# Patient Record
Sex: Female | Born: 1990 | Race: Black or African American | Hispanic: No | Marital: Single | Smoking: Current some day smoker
Health system: Southern US, Community
[De-identification: ages and names within clinical notes are randomized; demographics above are authoritative.]

## PROBLEM LIST (undated history)

## (undated) ENCOUNTER — Inpatient Hospital Stay (HOSPITAL_COMMUNITY): Payer: Self-pay

## (undated) DIAGNOSIS — F410 Panic disorder [episodic paroxysmal anxiety] without agoraphobia: Secondary | ICD-10-CM

## (undated) DIAGNOSIS — R519 Headache, unspecified: Secondary | ICD-10-CM

## (undated) DIAGNOSIS — N200 Calculus of kidney: Secondary | ICD-10-CM

## (undated) DIAGNOSIS — D649 Anemia, unspecified: Secondary | ICD-10-CM

## (undated) DIAGNOSIS — N390 Urinary tract infection, site not specified: Secondary | ICD-10-CM

## (undated) DIAGNOSIS — I2699 Other pulmonary embolism without acute cor pulmonale: Secondary | ICD-10-CM

## (undated) DIAGNOSIS — J45909 Unspecified asthma, uncomplicated: Secondary | ICD-10-CM

## (undated) HISTORY — PX: NO PAST SURGERIES: SHX2092

## (undated) HISTORY — DX: Anemia, unspecified: D64.9

## (undated) NOTE — *Deleted (*Deleted)
History     CSN: [id number]  Arrival date and time: 08/05/20 1654   First Provider Initiated Contact with Patient 08/05/20 1847      Chief Complaint  Patient presents with  . Nausea  . Emesis   HPI  Ms.Carmen [name] is a 15 y.o. female G2P1001 @ [redacted]w[redacted]d with a history of cannabis hyperemesis, DVT on lovenox here with nausea and vomiting. Reports she has not energy to do anything and has had off and on abdominal pain. The pain is all over her abdomen and feels like "bulges". She was taking phenergan suppository,  however ran out of her prescription 2 days ago. Reports she cannot take Zofran because she cannot take a pill. Reports not being able to keep anything down in the last 24 hours. She took her scopolamine patch off and did not replace it.   Patient requesting to have a note to be out of work d/t symptoms.   OB History    Gravida  2   Para  1   Term  1   Preterm  0   AB  0   Living  1     SAB  0   TAB  0   Ectopic  0   Multiple  0   Live Births  1           Past Medical History:  Diagnosis Date  . Anemia   . Asthma   . Panic attacks   . Pulmonary embolism (HCC) 02/08/2018  . Pulmonary embolism, antepartum 02/08/2018   Indications: PE diagnosed 5/2@ [facility].  Left side, lower lobe;   Admitted 5/2-5/6 - heparin drip>lovenox 60BID.  ED evaluation 5/8 2/2 persistent SOB - negative DVT, nml ECG Anticoagulation goal is therapeutic  Therapeutic dose is Lovenox 1mg /kg SQ Q12--currently on 60 mg BID  Monitoring is indicated for patients on therapeutic anticoagulation  Will check Anti-Xa level 4-6 hours after 3rd dose Q trime    Past Surgical History:  Procedure Laterality Date  . NO PAST SURGERIES      Family History  Problem Relation Age of Onset  . Pulmonary embolism Mother   . Ulcers Sister     Social History   Tobacco Use  . Smoking status: Former Smoker    Types: Cigarettes  . Smokeless tobacco: Never Used  Vaping Use  . Vaping Use: Never used   Substance Use Topics  . Alcohol use: Not Currently  . Drug use: No    Allergies:  Allergies  Allergen Reactions  . Morphine And Related Anaphylaxis, Swelling and Other (See Comments)    "swells shut" the patient's throat    Medications Prior to Admission  Medication Sig Dispense Refill Last Dose  . enoxaparin (LOVENOX) 40 MG/0.4ML injection Inject 0.4 mLs (40 mg total) into the skin daily. 30 mL 2 Past Week at Unknown time  . promethazine (PHENERGAN) 25 MG suppository Place 1 suppository (25 mg total) rectally every 6 (six) hours as needed for nausea or vomiting. 12 each 2 Past Week at Unknown time  . albuterol (VENTOLIN HFA) 108 (90 Base) MCG/ACT inhaler Inhale 1-2 puffs into the lungs every 6 (six) hours as needed for wheezing or shortness of breath. 18 g 2   . Prenatal Vit-Fe Fumarate-FA (PRENATAL PO) Take 1 tablet by mouth daily.      Results for orders placed or performed during the hospital encounter of 08/05/20 (from the past 48 hour(s))  Urinalysis, Routine w reflex microscopic Urine, Clean Catch  Status: Abnormal   Collection Time: 08/05/20  6:30 PM  Result Value Ref Range   Color, Urine YELLOW YELLOW   APPearance HAZY (A) CLEAR   Specific Gravity, Urine 1.017 1.005 - 1.030   pH 6.0 5.0 - 8.0   Glucose, UA NEGATIVE NEGATIVE mg/dL   Hgb urine dipstick NEGATIVE NEGATIVE   Bilirubin Urine NEGATIVE NEGATIVE   Ketones, ur NEGATIVE NEGATIVE mg/dL   Protein, ur NEGATIVE NEGATIVE mg/dL   Nitrite NEGATIVE NEGATIVE   Leukocytes,Ua SMALL (A) NEGATIVE   RBC / HPF 0-5 0 - 5 RBC/hpf   WBC, UA 6-10 0 - 5 WBC/hpf   Bacteria, UA FEW (A) NONE SEEN   Squamous Epithelial / LPF 0-5 0 - 5   Mucus PRESENT     Comment: Performed at [facility], [address], [place], [place] [address]  CBC     Status: None   Collection Time: 08/05/20  7:14 PM  Result Value Ref Range   WBC 7.9 4.0 - 10.5 K/uL   RBC 4.13 3.87 - 5.11 MIL/uL   Hemoglobin 12.1 12.0 - 15.0 g/dL   HCT 47.8 36 -  46 %   MCV 88.6 80.0 - 100.0 fL   MCH 29.3 26.0 - 34.0 pg   MCHC 33.1 30.0 - 36.0 g/dL   RDW 29.5 62.1 - 30.8 %   Platelets 246 150 - 400 K/uL   nRBC 0.0 0.0 - 0.2 %    Comment: Performed at [facility], [address] [place], [place] [address]   Review of Systems  Constitutional: Negative for fever.  Gastrointestinal: Positive for abdominal pain, nausea and vomiting. Negative for diarrhea.  Genitourinary: Negative for pelvic pain.   Physical Exam   Blood pressure 106/74, pulse (!) 106, temperature 98.7 F (37.1 C), temperature source Oral, resp. rate 18, height 4\' 11"  (1.499 m), weight 57.2 kg, last menstrual period 04/19/2020, SpO2 99 %, unknown if currently breastfeeding.  Physical Exam Constitutional:      Appearance: She is ill-appearing. She is not toxic-appearing.  HENT:     Head: Normocephalic.  Abdominal:     Tenderness: There is generalized abdominal tenderness. There is no guarding or rebound.  Neurological:     Mental Status: She is alert.  Psychiatric:        Mood and Affect: Affect is flat and inappropriate.        Behavior: Behavior is slowed and withdrawn.    MAU Course  Procedures  None  MDM  + fetal heart tones via doppler  Weight 9/20: 53 kg, today 57 kg UA without severe dehydration.  Lr bolus X 1 Pepcid, Zofran, phenergan IV  Report given to Wynelle Bourgeois, CNM who resumes care of the patient.   Duane Lope, NP 08/05/2020 7:38 PM   Assessment and Plan

---

## 2015-07-04 ENCOUNTER — Emergency Department (INDEPENDENT_AMBULATORY_CARE_PROVIDER_SITE_OTHER): Payer: Self-pay

## 2015-07-04 ENCOUNTER — Emergency Department (INDEPENDENT_AMBULATORY_CARE_PROVIDER_SITE_OTHER)
Admission: EM | Admit: 2015-07-04 | Discharge: 2015-07-04 | Disposition: A | Discharge status: Home / Self Care | Payer: Self-pay | Source: Home / Self Care | Attending: Family Medicine | Admitting: Family Medicine

## 2015-07-04 DIAGNOSIS — S60419A Abrasion of unspecified finger, initial encounter: Secondary | ICD-10-CM

## 2015-07-04 DIAGNOSIS — S6992XA Unspecified injury of left wrist, hand and finger(s), initial encounter: Secondary | ICD-10-CM

## 2015-07-04 MED ORDER — NAPROXEN 500 MG PO TABS: 500.0000 mg | ORAL_TABLET | Freq: Two times a day (BID) | ORAL | Status: DC

## 2015-07-04 MED ORDER — KETOROLAC TROMETHAMINE 30 MG/ML IJ SOLN
30.0000 mg | Freq: Once | INTRAMUSCULAR | Status: AC
  Administered 2015-07-04: 30 mg via INTRAVENOUS

## 2015-07-04 MED ORDER — KETOROLAC TROMETHAMINE 30 MG/ML IJ SOLN
INTRAMUSCULAR | Status: AC
  Filled 2015-07-04: qty 1

## 2015-07-04 NOTE — ED Provider Notes (Signed)
CSN: [id number]     Arrival date & time 07/04/15  1857 History   None    No chief complaint on file.  (Consider location/radiation/quality/duration/timing/severity/associated sxs/prior Treatment) Patient is a 24 y.o. female presenting with hand injury. The history is provided by the patient. No language interpreter was used.  Hand Injury Location:  Hand Time since incident:  1 hour Injury: yes   Mechanism of injury comment:  She was trying to raise her window at home and it fell back on her left hand/finger Hand location:  L hand Pain details:    Quality:  Aching   Radiates to:  Does not radiate   Severity:  Severe (pain is about 10/10 in severity)   Onset quality:  Sudden   Timing:  Constant   Progression:  Unchanged Tetanus status:  Up to date (patient stated she got her tetanus shot .) Relieved by:  Rest Worsened by:  Nothing tried Ineffective treatments:  None tried Associated symptoms: no muscle weakness, no numbness, no swelling and no tingling     No past medical history on file. No past surgical history on file. No family history on file. Social History  Substance Use Topics  . Smoking status: Not on file  . Smokeless tobacco: Not on file  . Alcohol Use: Not on file   OB History    No data available     Review of Systems  Respiratory: Negative.   Cardiovascular: Negative.   Gastrointestinal: Negative.   Musculoskeletal: Positive for arthralgias.       Left hand pain  Skin: Positive for wound.       Left hand pain. Left finger injury  All other systems reviewed and are negative.   Allergies  Review of patient's allergies indicates not on file.  Home Medications   Prior to Admission medications   Not on File   Meds Ordered and Administered this Visit  Medications - No data to display  There were no vitals taken for this visit. No data found.   Physical Exam  Constitutional: She appears well-developed. No distress.  Cardiovascular: Normal rate,  regular rhythm and normal heart sounds.   No murmur heard. Pulmonary/Chest: Effort normal and breath sounds normal. No respiratory distress. She has no wheezes.  Musculoskeletal:       Left hand: She exhibits decreased range of motion and tenderness. She exhibits normal capillary refill and no swelling. Normal sensation noted.       Hands: Nursing note and vitals reviewed.   ED Course  Procedures (including critical care time)  Labs Review Labs Reviewed - No data to display  Imaging Review No results found. Dg Hand Complete Left  07/04/2015   CLINICAL DATA:  Crush injury to the second through fourth digits, initial encounter  EXAM: LEFT HAND - COMPLETE 3+ VIEW  COMPARISON:  None.  FINDINGS: Soft tissue injury is noted along the distal aspects of the second third and fourth digits related to the recent injury. No underlying bony abnormality is seen.  IMPRESSION: Soft tissue injury without bony abnormality.   Electronically Signed   By: Alcide Clever M.D.   On: 07/04/2015 19:41     Visual Acuity Review  Right Eye Distance:   Left Eye Distance:   Bilateral Distance:    Right Eye Near:   Left Eye Near:    Bilateral Near:         MDM  No diagnosis found. Left hand injury. Left finger abrasion ( mild)  Xray  done to r/o crush or fracture, this was negative. Wound dressing done today. Toradol 30 mg IM given x 1 for pain which relieved her symptoms. No indication fr suturing today. Naproxen given prn pain. Return precaution discussed. More than 30 min was spent for this face to face encounter and coordination of care.    Doreene Eland, MD 07/04/15 2025

## 2015-07-04 NOTE — Discharge Instructions (Signed)
Abrasions An abrasion is a cut or scrape of the skin. Abrasions do not go through all layers of the skin. HOME CARE  If a bandage (dressing) was put on your wound, change it as told by your doctor. If the bandage sticks, soak it off with warm.  Wash the area with water and soap 2 times a day. Rinse off the soap. Pat the area dry with a clean towel.  Put on medicated cream (ointment) as told by your doctor.  Change your bandage right away if it gets wet or dirty.  Only take medicine as told by your doctor.  See your doctor within 24-48 hours to get your wound checked.  Check your wound for redness, puffiness (swelling), or yellowish-white fluid (pus). GET HELP RIGHT AWAY IF:   You have more pain in the wound.  You have redness, swelling, or tenderness around the wound.  You have pus coming from the wound.  You have a fever or lasting symptoms for more than 2-3 days.  You have a fever and your symptoms suddenly get worse.  You have a bad smell coming from the wound or bandage. MAKE SURE YOU:   Understand these instructions.  Will watch your condition.  Will get help right away if you are not doing well or get worse. Document Released: 03/13/2008 Document Revised: 06/19/2012 Document Reviewed: 08/29/2011 Exit[employer] Patient Information 2015 [employer]. This information is not intended to replace advice given to you by your health care provider. Make sure you discuss any questions you have with your health care provider.  

## 2015-07-04 NOTE — ED Notes (Signed)
The patient presented to the [facility] with a complaint of a hand injury. The patient stated that she was trying to raise a window and the window fell on her hand.

## 2015-07-09 ENCOUNTER — Emergency Department (INDEPENDENT_AMBULATORY_CARE_PROVIDER_SITE_OTHER)
Admission: EM | Admit: 2015-07-09 | Discharge: 2015-07-09 | Disposition: A | Discharge status: Home / Self Care | Payer: Self-pay | Source: Home / Self Care | Attending: Family Medicine | Admitting: Family Medicine

## 2015-07-09 ENCOUNTER — Encounter (HOSPITAL_COMMUNITY): Payer: Self-pay | Admitting: Emergency Medicine

## 2015-07-09 DIAGNOSIS — S6992XD Unspecified injury of left wrist, hand and finger(s), subsequent encounter: Secondary | ICD-10-CM

## 2015-07-09 NOTE — ED Notes (Signed)
Patient reports fingers hurting worse today.  Wanted fingers to be evaluated .  Has been taking naprosyn.

## 2015-07-09 NOTE — Discharge Instructions (Signed)
Warm epsom salts soak of hand 2-3 times a day, see orthopedist if further problems

## 2015-07-09 NOTE — ED Provider Notes (Signed)
CSN: [id number]     Arrival date & time 07/09/15  1334 History   First MD Initiated Contact with Patient 07/09/15 1432     Chief Complaint  Patient presents with  . Hand Problem   (Consider location/radiation/quality/duration/timing/severity/associated sxs/prior Treatment) Patient is a 24 y.o. female presenting with hand injury. The history is provided by the patient and a friend.  Hand Injury Location:  Hand Time since incident:  5 days (pt seen 1 hr after injury at [facility] and treated, pt still with pain in fingers.) Hand location:  L hand Pain details:    Quality:  Sharp and throbbing   Severity:  Moderate   Progression:  Unchanged Chronicity:  New Handedness:  Right-handed Dislocation: no   Tetanus status:  Up to date Prior injury to area:  No   No past medical history on file. No past surgical history on file. No family history on file. Social History  Substance Use Topics  . Smoking status: Not on file  . Smokeless tobacco: Not on file  . Alcohol Use: Not on file   OB History    No data available     Review of Systems  Musculoskeletal: Negative for joint swelling.  Skin: Positive for wound.  All other systems reviewed and are negative.   Allergies  Morphine and related  Home Medications   Prior to Admission medications   Medication Sig Start Date End Date Taking? Authorizing Provider  naproxen (NAPROSYN) 500 MG tablet Take 1 tablet (500 mg total) by mouth 2 (two) times daily. 07/04/15   Doreene Eland, MD   Meds Ordered and Administered this Visit  Medications - No data to display  BP 112/79 mmHg  Pulse 74  Temp(Src) 98.7 F (37.1 C) (Oral)  Resp 16  SpO2 98%  LMP 06/18/2015 (Exact Date) No data found.   Physical Exam  Constitutional: She is oriented to person, place, and time. She appears well-developed and well-nourished.  Musculoskeletal: She exhibits tenderness.  Abrasion flaps to lif with assoc pain in lmf and lrf with extension , no sts, no  warmth or erythema or drainage. Simply holding hand causes pain .  Neurological: She is alert and oriented to person, place, and time.  Skin: Skin is warm and dry.  Nursing note and vitals reviewed.   ED Course  Procedures (including critical care time)  Labs Review Labs Reviewed - No data to display  Imaging Review No results found.   Visual Acuity Review  Right Eye Distance:   Left Eye Distance:   Bilateral Distance:    Right Eye Near:   Left Eye Near:    Bilateral Near:         MDM   1. Hand injury, left, subsequent encounter        Linna Hoff, MD 07/09/15 1501

## 2015-07-09 NOTE — ED Notes (Signed)
Refused to soak hand at [facility].  Dr Artis Flock notified

## 2015-12-08 ENCOUNTER — Emergency Department (INDEPENDENT_AMBULATORY_CARE_PROVIDER_SITE_OTHER)
Admission: EM | Admit: 2015-12-08 | Discharge: 2015-12-08 | Disposition: A | Discharge status: Home / Self Care | Payer: Self-pay | Source: Home / Self Care | Attending: Emergency Medicine | Admitting: Emergency Medicine

## 2015-12-08 ENCOUNTER — Encounter (HOSPITAL_COMMUNITY): Payer: Self-pay | Admitting: Emergency Medicine

## 2015-12-08 DIAGNOSIS — I889 Nonspecific lymphadenitis, unspecified: Secondary | ICD-10-CM

## 2015-12-08 MED ORDER — CEPHALEXIN 500 MG PO CAPS: 500.0000 mg | ORAL_CAPSULE | Freq: Four times a day (QID) | ORAL | Status: DC

## 2015-12-08 NOTE — Discharge Instructions (Signed)

## 2015-12-08 NOTE — ED Notes (Signed)
Pt has a lump in her right groin that she states comes and goes.  She has had it for a year.  She states it is painful when it is enlarged and it hurts to stand up for long periods.  She gets sharp pains in it when standing.  She denies any fever.

## 2015-12-08 NOTE — ED Notes (Signed)
At bedside for provider exam of groin pain

## 2015-12-08 NOTE — ED Provider Notes (Signed)
CSN: [id number]     Arrival date & time 12/08/15  1303 History   First MD Initiated Contact with Patient 12/08/15 1340     Chief Complaint  Patient presents with  . Abscess    right groin   (Consider location/radiation/quality/duration/timing/severity/associated sxs/prior Treatment) HPI Comments: 25 year old female complaining of a lump to the right groin for proximal one year. She comes and goes. It will often completely disappear and will be asymptomatic. Other times it wall develop with tenderness and pain particularly on standing for prolonged periods of time. There does not seem to be any pattern or known stimulus that precedes the onset. It may last for several days or couple weeks before spontaneously resolves. Denies urogenital symptoms. Denies pain or sores or wounds to the right lower extremity. Denies known trauma.   History reviewed. No pertinent past medical history. History reviewed. No pertinent past surgical history. History reviewed. No pertinent family history. Social History  Substance Use Topics  . Smoking status: Current Some Day Smoker    Types: Cigarettes  . Smokeless tobacco: None  . Alcohol Use: Yes     Comment: 1 pack every 2 weeks   OB History    No data available     Review of Systems  Constitutional: Negative for fever, activity change and fatigue.  HENT: Negative.   Respiratory: Negative.   Cardiovascular: Negative.   Gastrointestinal: Negative.   Genitourinary: Negative for dysuria, urgency, frequency, flank pain, vaginal bleeding, vaginal discharge, genital sores, vaginal pain, menstrual problem and pelvic pain.  Musculoskeletal: Negative.   Skin: Negative.   Neurological: Negative.   Hematological: Positive for adenopathy.    Allergies  Morphine and related  Home Medications   Prior to Admission medications   Medication Sig Start Date End Date Taking? Authorizing Provider  cephALEXin (KEFLEX) 500 MG capsule Take 1 capsule (500 mg total)  by mouth 4 (four) times daily. 12/08/15   Hayden Rasmussen, NP  naproxen (NAPROSYN) 500 MG tablet Take 1 tablet (500 mg total) by mouth 2 (two) times daily. 07/04/15   Doreene Eland, MD   Meds Ordered and Administered this Visit  Medications - No data to display  BP 113/70 mmHg  Pulse 92  Temp(Src) 99.3 F (37.4 C) (Oral)  SpO2 97%  LMP 11/26/2015 (Exact Date) No data found.   Physical Exam  Constitutional: She is oriented to person, place, and time. She appears well-developed and well-nourished. No distress.  HENT:  Head: Normocephalic and atraumatic.  Eyes: EOM are normal.  Neck: Normal range of motion. Neck supple.  Cardiovascular: Normal rate.   Pulmonary/Chest: Effort normal. No respiratory distress.  Musculoskeletal: She exhibits no edema.  Neurological: She is alert and oriented to person, place, and time. She exhibits normal muscle tone.  Skin: Skin is warm and dry.  There is a tender solitary mildly enlarged lymph node to the right inguinal crease. No overlying erythema. No lymphangitis. The node does not palpate is fluctuant. No overlying skin changes. It is mobile but again tender. Performing straight leg raise does not produce pain. No observed or palpated evidence for hernia. No tenderness to the adductor musculature. No tenderness over the pubis/suprapubic. Kim, present during exam.RN  Psychiatric: She has a normal mood and affect.  Nursing note and vitals reviewed.   ED Course  Procedures (including critical care time)  Labs Review Labs Reviewed - No data to display  Imaging Review No results found.   Visual Acuity Review  Right Eye Distance:  Left Eye Distance:   Bilateral Distance:    Right Eye Near:   Left Eye Near:    Bilateral Near:         MDM   1. Inguinal lymphadenitis    No source seen for adenitis Warm compresses, Do not shave area for a while Look for signs of local infection if reoccurs Keflex as dir    Hayden Rasmussen,  NP 12/08/15 1415  Hayden Rasmussen, NP 12/08/15 1416

## 2016-02-01 ENCOUNTER — Encounter (HOSPITAL_COMMUNITY): Payer: Self-pay | Admitting: Emergency Medicine

## 2016-02-01 ENCOUNTER — Ambulatory Visit (HOSPITAL_COMMUNITY)
Admission: EM | Admit: 2016-02-01 | Discharge: 2016-02-01 | Disposition: A | Discharge status: Home / Self Care | Payer: Self-pay | Attending: Emergency Medicine | Admitting: Emergency Medicine

## 2016-02-01 DIAGNOSIS — L0231 Cutaneous abscess of buttock: Secondary | ICD-10-CM

## 2016-02-01 MED ORDER — HYDROCODONE-ACETAMINOPHEN 5-325 MG PO TABS: 1.0000 | ORAL_TABLET | ORAL | Status: DC | PRN

## 2016-02-01 MED ORDER — SULFAMETHOXAZOLE-TRIMETHOPRIM 800-160 MG PO TABS: 1.0000 | ORAL_TABLET | Freq: Two times a day (BID) | ORAL | Status: AC

## 2016-02-01 MED ORDER — HYDROCODONE-ACETAMINOPHEN 5-325 MG PO TABS
1.0000 | ORAL_TABLET | Freq: Once | ORAL | Status: AC
  Administered 2016-02-01: 1 via ORAL

## 2016-02-01 MED ORDER — LIDOCAINE-EPINEPHRINE (PF) 2 %-1:200000 IJ SOLN
INTRAMUSCULAR | Status: AC
  Filled 2016-02-01: qty 20

## 2016-02-01 MED ORDER — HYDROCODONE-ACETAMINOPHEN 5-325 MG PO TABS
ORAL_TABLET | ORAL | Status: AC
  Filled 2016-02-01: qty 1

## 2016-02-01 NOTE — ED Notes (Signed)
Assisted provider with i/d procedure.

## 2016-02-01 NOTE — Discharge Instructions (Signed)
You have an abscess. We drained it today. Please continue warm soaks or compresses for the next 2 days. You'll want to wear a pad to collect any drainage or the next 2 days. Take Bactrim twice a day for the next week. You can take Tylenol or ibuprofen as needed for pain. Use the hydrocodone every 4 hours as needed for severe pain. The worst of the pain should be over in the next 24 hours. Follow-up as needed.

## 2016-02-01 NOTE — ED Notes (Signed)
Patient has an abscess on buttocks, history of the same.  Noticed this abscess 4 days ago. Patient has been soaking in hot water, warm compresses.  Patient is very anxious, has panic attacks.

## 2016-02-01 NOTE — ED Provider Notes (Signed)
CSN: [id number]     Arrival date & time 02/01/16  1720 History   First MD Initiated Contact with Patient 02/01/16 1756     Chief Complaint  Patient presents with  . Cyst   (Consider location/radiation/quality/duration/timing/severity/associated sxs/prior Treatment) HPI She is a 25 year old woman here for evaluation of abscess. She states she noticed abscess about 4 days ago. It has been getting progressively larger and more painful. She has been doing warm compresses and soaks without improvement. She denies any drainage. Known fevers. Abscess is in the gluteal cleft. She is very anxious.  History reviewed. No pertinent past medical history. History reviewed. No pertinent past surgical history. No family history on file. Social History  Substance Use Topics  . Smoking status: Current Some Day Smoker    Types: Cigarettes  . Smokeless tobacco: None  . Alcohol Use: Yes     Comment: 1 pack every 2 weeks   OB History    No data available     Review of Systems As in history of present illness Allergies  Morphine and related  Home Medications   Prior to Admission medications   Medication Sig Start Date End Date Taking? Authorizing Provider  HYDROcodone-acetaminophen (NORCO) 5-325 MG tablet Take 1 tablet by mouth every 4 (four) hours as needed for moderate pain. 02/01/16   Charm RingsErin J Honig, MD  sulfamethoxazole-trimethoprim (BACTRIM DS,SEPTRA DS) 800-160 MG tablet Take 1 tablet by mouth 2 (two) times daily. 02/01/16 02/08/16  Charm RingsErin J Honig, MD   Meds Ordered and Administered this Visit   Medications  HYDROcodone-acetaminophen (NORCO/VICODIN) 5-325 MG per tablet 1 tablet (1 tablet Oral Given 02/01/16 1837)    BP 116/84 mmHg  Pulse 98  Temp(Src) 99.1 F (37.3 C) (Oral)  Resp 16  SpO2 98%  LMP 01/09/2016 No data found.   Physical Exam  Constitutional: She is oriented to person, place, and time. She appears well-developed and well-nourished. No distress.  anxious  Cardiovascular:  Normal rate.   Pulmonary/Chest: Effort normal.  Neurological: She is alert and oriented to person, place, and time.  Skin:  1 x 2 cm abscess in the gluteal cleft on the right side.There is mild overlying erythema. It is fluctuant.    ED Course  .Marland Kitchen.Incision and Drainage Date/Time: 02/01/2016 6:58 PM Performed by: Charm RingsHONIG, ERIN J Authorized by: Charm RingsHONIG, ERIN J Consent: Verbal consent obtained. Risks and benefits: risks, benefits and alternatives were discussed Consent given by: patient Patient understanding: patient states understanding of the procedure being performed Patient identity confirmed: verbally with patient Time out: Immediately prior to procedure a "time out" was called to verify the correct patient, procedure, equipment, support staff and site/side marked as required. Type: abscess Body area: anogenital Location details: gluteal cleft Anesthesia: local infiltration Local anesthetic: lidocaine 2% with epinephrine Anesthetic total: 1 ml Scalpel size: 11 Incision type: single straight Incision depth: dermal Complexity: simple Drainage: purulent Drainage amount: moderate Wound treatment: wound left open Patient tolerance: Patient tolerated the procedure well with no immediate complications   (including critical care time)  Labs Review Labs Reviewed - No data to display  Imaging Review No results found.    MDM   1. Abscess of buttock, right    Continue warm soaks for 2 days. Bactrim for 1 week. Hydrocodone for pain. Follow-up as needed.    Charm RingsErin J Honig, MD 02/01/16 [phone]

## 2016-02-11 ENCOUNTER — Encounter (HOSPITAL_COMMUNITY): Payer: Self-pay

## 2016-02-11 ENCOUNTER — Emergency Department (HOSPITAL_COMMUNITY): Payer: Self-pay

## 2016-02-11 ENCOUNTER — Emergency Department (HOSPITAL_COMMUNITY)
Admission: EM | Admit: 2016-02-11 | Discharge: 2016-02-11 | Disposition: A | Discharge status: Home / Self Care | Payer: Self-pay | Attending: Emergency Medicine | Admitting: Emergency Medicine

## 2016-02-11 DIAGNOSIS — M542 Cervicalgia: Secondary | ICD-10-CM | POA: Insufficient documentation

## 2016-02-11 DIAGNOSIS — R0682 Tachypnea, not elsewhere classified: Secondary | ICD-10-CM | POA: Insufficient documentation

## 2016-02-11 DIAGNOSIS — R05 Cough: Secondary | ICD-10-CM | POA: Insufficient documentation

## 2016-02-11 DIAGNOSIS — R112 Nausea with vomiting, unspecified: Secondary | ICD-10-CM | POA: Insufficient documentation

## 2016-02-11 DIAGNOSIS — F17210 Nicotine dependence, cigarettes, uncomplicated: Secondary | ICD-10-CM | POA: Insufficient documentation

## 2016-02-11 DIAGNOSIS — Z3202 Encounter for pregnancy test, result negative: Secondary | ICD-10-CM | POA: Insufficient documentation

## 2016-02-11 DIAGNOSIS — R0981 Nasal congestion: Secondary | ICD-10-CM | POA: Insufficient documentation

## 2016-02-11 DIAGNOSIS — F410 Panic disorder [episodic paroxysmal anxiety] without agoraphobia: Secondary | ICD-10-CM | POA: Insufficient documentation

## 2016-02-11 HISTORY — DX: Panic disorder (episodic paroxysmal anxiety): F41.0

## 2016-02-11 LAB — I-STAT TROPONIN, ED: Troponin i, poc: 0 ng/mL (ref 0.00–0.08)

## 2016-02-11 LAB — COMPREHENSIVE METABOLIC PANEL
ALBUMIN: 4.6 g/dL (ref 3.5–5.0)
ALT: 14 U/L (ref 14–54)
AST: 24 U/L (ref 15–41)
Alkaline Phosphatase: 73 U/L (ref 38–126)
Anion gap: 11 (ref 5–15)
BUN: 11 mg/dL (ref 6–20)
CHLORIDE: 107 mmol/L (ref 101–111)
CO2: 22 mmol/L (ref 22–32)
CREATININE: 0.63 mg/dL (ref 0.44–1.00)
Calcium: 9.2 mg/dL (ref 8.9–10.3)
GFR calc Af Amer: >60 mL/min (ref >60)
GLUCOSE: 108 mg/dL — AB (ref 65–99)
Potassium: 3.7 mmol/L (ref 3.5–5.1)
Sodium: 140 mmol/L (ref 135–145)
Total Bilirubin: 0.5 mg/dL (ref 0.3–1.2)
Total Protein: 8.3 g/dL — ABNORMAL HIGH (ref 6.5–8.1)

## 2016-02-11 LAB — URINALYSIS, ROUTINE W REFLEX MICROSCOPIC
BILIRUBIN URINE: NEGATIVE
GLUCOSE, UA: NEGATIVE mg/dL
LEUKOCYTES UA: NEGATIVE
Nitrite: NEGATIVE
PH: 8.5 — AB (ref 5.0–8.0)
PROTEIN: 30 mg/dL — AB
Specific Gravity, Urine: 1.031 — ABNORMAL HIGH (ref 1.005–1.030)

## 2016-02-11 LAB — CBC
HEMATOCRIT: 36 % (ref 36.0–46.0)
Hemoglobin: 11.8 g/dL — ABNORMAL LOW (ref 12.0–15.0)
MCH: 28.2 pg (ref 26.0–34.0)
MCHC: 32.8 g/dL (ref 30.0–36.0)
MCV: 86.1 fL (ref 78.0–100.0)
PLATELETS: 267 10*3/uL (ref 150–400)
RBC: 4.18 MIL/uL (ref 3.87–5.11)
RDW: 13.1 % (ref 11.5–15.5)
WBC: 8.1 10*3/uL (ref 4.0–10.5)

## 2016-02-11 LAB — LIPASE, BLOOD: Lipase: 16 U/L (ref 11–51)

## 2016-02-11 LAB — URINE MICROSCOPIC-ADD ON

## 2016-02-11 LAB — D-DIMER, QUANTITATIVE: D-Dimer, Quant: <0.27 ug/mL-FEU (ref 0.00–0.50)

## 2016-02-11 LAB — I-STAT BETA HCG BLOOD, ED (MC, WL, AP ONLY): I-stat hCG, quantitative: <5 m[IU]/mL (ref <5)

## 2016-02-11 MED ORDER — SODIUM CHLORIDE 0.9 % IV BOLUS (SEPSIS)
2000.0000 mL | Freq: Once | INTRAVENOUS | Status: AC
  Administered 2016-02-11: 2000 mL via INTRAVENOUS

## 2016-02-11 MED ORDER — ONDANSETRON HCL 4 MG/2ML IJ SOLN
4.0000 mg | Freq: Once | INTRAMUSCULAR | Status: AC
  Administered 2016-02-11: 4 mg via INTRAVENOUS
  Filled 2016-02-11: qty 2

## 2016-02-11 MED ORDER — ONDANSETRON HCL 4 MG/2ML IJ SOLN: 4.0000 mg | Freq: Once | INTRAMUSCULAR | Status: DC | PRN

## 2016-02-11 MED ORDER — LORAZEPAM 2 MG/ML IJ SOLN
1.0000 mg | Freq: Once | INTRAMUSCULAR | Status: AC
  Administered 2016-02-11: 1 mg via INTRAVENOUS
  Filled 2016-02-11: qty 1

## 2016-02-11 NOTE — ED Provider Notes (Addendum)
CSN: [id number]     Arrival date & time 02/11/16  1704 History   First MD Initiated Contact with Patient 02/11/16 1800     Chief Complaint  Patient presents with  . Neck Pain  . Panic Attack  . Nausea  . Emesis     (Consider location/radiation/quality/duration/timing/severity/associated sxs/prior Treatment) HPI Patient complains of right-sided neck pain (points to right trapezius) onset this morning onset after taking cough medicine for "a cold". She also reports she's vomited "every 10 minutes" since this morning. She denies abdominal pain her last menstrual period was 2 weeks ago. Admits to cough and congestion. She denies other associated symptoms. Nothing makes symptoms better or worse. No treatment prior to coming here. She admits to being under stress at work presently. Past Medical History  Diagnosis Date  . Panic attacks    History reviewed. No pertinent past surgical history. History reviewed. No pertinent family history. Social History  Substance Use Topics  . Smoking status: Current Some Day Smoker    Types: Cigarettes  . Smokeless tobacco: None  . Alcohol Use: Yes     Comment: 1 pack every 2 weeks   OB History    No data available     Review of Systems  Constitutional: Negative.   HENT: Negative.   Respiratory: Negative.   Cardiovascular: Negative.   Gastrointestinal: Positive for nausea and vomiting.  Musculoskeletal: Positive for neck pain.  Skin: Negative.   Neurological: Negative.   Psychiatric/Behavioral: Negative.   All other systems reviewed and are negative.     Allergies  Morphine and related  Home Medications   Prior to Admission medications   Medication Sig Start Date End Date Taking? Authorizing Provider  DM-Doxylamine-Acetaminophen (TYLENOL COUGH/SORE THROAT) 30-12.02-999 MG/30ML LIQD Take 30 mLs by mouth every 4 (four) hours as needed (for cold).   Yes Historical Provider, MD  HYDROcodone-acetaminophen (NORCO) 5-325 MG tablet Take 1  tablet by mouth every 4 (four) hours as needed for moderate pain. Patient not taking: Reported on 02/11/2016 02/01/16   Charm Rings, MD   BP 108/76 mmHg  Pulse 81  Temp(Src) 97.9 F (36.6 C) (Oral)  Resp 35  SpO2 100%  LMP 02/11/2016 (Approximate) Physical Exam  Constitutional: She appears well-developed and well-nourished. She appears distressed.  Anxious appearing. Hyperventilating  HENT:  Head: Normocephalic and atraumatic.  Right Ear: External ear normal.  Left Ear: External ear normal.  Nose: Nose normal.  Mouth/Throat: No oropharyngeal exudate.  Eyes: Conjunctivae are normal. Pupils are equal, round, and reactive to light.  Neck: Neck supple. No tracheal deviation present. No thyromegaly present.  No point tenderness  Cardiovascular: Normal rate and regular rhythm.   No murmur heard. Pulmonary/Chest:  Tachypnea,  Abdominal: Soft. Bowel sounds are normal. She exhibits no distension. There is no tenderness.  Musculoskeletal: Normal range of motion. She exhibits no edema or tenderness.  Neurological: She is alert. Coordination normal.  Skin: Skin is warm and dry. No rash noted.  Psychiatric: She has a normal mood and affect.  Nursing note and vitals reviewed.   ED Course  Procedures (including critical care time) Labs Review Labs Reviewed  LIPASE, BLOOD  COMPREHENSIVE METABOLIC PANEL  CBC  URINALYSIS, ROUTINE W REFLEX MICROSCOPIC (NOT AT [facility])  D-DIMER, QUANTITATIVE (NOT AT [facility])  I-STAT TROPOININ, ED  I-STAT BETA HCG BLOOD, ED ([facility], [facility], [facility] ONLY)    Imaging Review No results found. I have personally reviewed and evaluated these images and lab results as part of my medical  decision-making.   EKG Interpretation   Date/Time:  Friday Feb 11 2016 17:11:30 EDT Ventricular Rate:  111 PR Interval:  130 QRS Duration: 88 QT Interval:  333 QTC Calculation: 452 R Axis:   14 Text Interpretation:  Sinus tachycardia Multiform ventricular premature  complexes Aberrant  conduction of SV complex(es) Borderline repolarization  abnormality No old tracing to compare Confirmed by Ethelda Chick  MD, Abhijot Straughter  [phone]) on 02/11/2016 5:36:33 PM     correction. No PVCs  8 PM patient continues to complain of nausea. After treatment with intravenous fluids and intravenous Zofran She denies abdominal pain. She states her arm and neck feel better. Intravenous Ativan ordered. On reexamination she appears more calm. Abdomen is soft and nontender  At 10 AM patient is alert Glasgow Coma Score 15. She is able to drink water without nausea or vomiting and all symptoms have resolved after further treatment with intravenous Ativan. She is able to drink water without difficulty Results for orders placed or performed during the hospital encounter of 02/11/16  Lipase, blood  Result Value Ref Range   Lipase 16 11 - 51 U/L  Comprehensive metabolic panel  Result Value Ref Range   Sodium 140 135 - 145 mmol/L   Potassium 3.7 3.5 - 5.1 mmol/L   Chloride 107 101 - 111 mmol/L   CO2 22 22 - 32 mmol/L   Glucose, Bld 108 (H) 65 - 99 mg/dL   BUN 11 6 - 20 mg/dL   Creatinine, Ser 6.04 0.44 - 1.00 mg/dL   Calcium 9.2 8.9 - 54.0 mg/dL   Total Protein 8.3 (H) 6.5 - 8.1 g/dL   Albumin 4.6 3.5 - 5.0 g/dL   AST 24 15 - 41 U/L   ALT 14 14 - 54 U/L   Alkaline Phosphatase 73 38 - 126 U/L   Total Bilirubin 0.5 0.3 - 1.2 mg/dL   GFR calc non Af Amer >60 >60 mL/min   GFR calc Af Amer >60 >60 mL/min   Anion gap 11 5 - 15  CBC  Result Value Ref Range   WBC 8.1 4.0 - 10.5 K/uL   RBC 4.18 3.87 - 5.11 MIL/uL   Hemoglobin 11.8 (L) 12.0 - 15.0 g/dL   HCT 98.1 19.1 - 47.8 %   MCV 86.1 78.0 - 100.0 fL   MCH 28.2 26.0 - 34.0 pg   MCHC 32.8 30.0 - 36.0 g/dL   RDW 29.5 62.1 - 30.8 %   Platelets 267 150 - 400 K/uL  Urinalysis, Routine w reflex microscopic  Result Value Ref Range   Color, Urine YELLOW YELLOW   APPearance CLEAR CLEAR   Specific Gravity, Urine 1.031 (H) 1.005 - 1.030   pH 8.5 (H) 5.0 - 8.0    Glucose, UA NEGATIVE NEGATIVE mg/dL   Hgb urine dipstick TRACE (A) NEGATIVE   Bilirubin Urine NEGATIVE NEGATIVE   Ketones, ur >80 (A) NEGATIVE mg/dL   Protein, ur 30 (A) NEGATIVE mg/dL   Nitrite NEGATIVE NEGATIVE   Leukocytes, UA NEGATIVE NEGATIVE  D-dimer, quantitative (not at [facility])  Result Value Ref Range   D-Dimer, Quant <0.27 0.00 - 0.50 ug/mL-FEU  Urine microscopic-add on  Result Value Ref Range   Squamous Epithelial / LPF 6-30 (A) NONE SEEN   WBC, UA 0-5 0 - 5 WBC/hpf   RBC / HPF 0-5 0 - 5 RBC/hpf   Bacteria, UA RARE (A) NONE SEEN   Urine-Other MUCOUS PRESENT   I-stat troponin, ED  Result Value Ref Range  Troponin i, poc 0.00 0.00 - 0.08 ng/mL   Comment 3          I-Stat Beta hCG blood, ED ([facility], [facility], [facility] only)  Result Value Ref Range   I-stat hCG, quantitative <5.0 <5 mIU/mL   Comment 3           Dg Chest 2 View  02/11/2016  CLINICAL DATA:  25 year old female with acute shortness of breath, right neck pain and right arm pain. EXAM: CHEST  2 VIEW COMPARISON:  None. FINDINGS: The cardiomediastinal silhouette is unremarkable. There is no evidence of focal airspace disease, pulmonary edema, suspicious pulmonary nodule/mass, pleural effusion, or pneumothorax. No acute bony abnormalities are identified. IMPRESSION: No active cardiopulmonary disease. Electronically Signed   By: Harmon PierJeffrey  Hu M.D.   On: 02/11/2016 18:53   Chest x-ray viewed by me MDM  I had further discussion with patient and discussed with her that is likely she had a panic attack. She is in agreement. Plan referral [facility] history of primary care physician Diagnosis #1 panic attack #2 nausea and vomiting  Final diagnoses:  None        Doug SouSam Keena Dinse, MD 02/11/16 2206  Doug SouSam Lance Galas, MD 02/11/16 2208

## 2016-02-11 NOTE — ED Notes (Signed)
Attempted IV twice, unsuccessful.  

## 2016-02-11 NOTE — Discharge Instructions (Signed)
Panic [facility] Panic attacks are sudden, short-livedsurges of severe anxiety, fear, or discomfort. They may occur for no reason when you are relaxed, when you are anxious, or when you are sleeping. Panic attacks may occur for a number of reasons:   Healthy people occasionally have panic attacks in extreme, life-threatening situations, such as war or natural disasters. Normal anxiety is a protective mechanism of the body that helps [place] react to danger (fight or flight response).  Panic attacks are often seen with anxiety disorders, such as panic disorder, social anxiety disorder, generalized anxiety disorder, and phobias. Anxiety disorders cause excessive or uncontrollable anxiety. They may interfere with your relationships or other life activities.  Panic attacks are sometimes seen with other mental illnesses, such as depression and posttraumatic stress disorder.  Certain medical conditions, prescription medicines, and drugs of abuse can cause panic attacks. SYMPTOMS  Panic attacks start suddenly, peak within 20 minutes, and are accompanied by four or more of the following symptoms:  Pounding heart or fast heart rate (palpitations).  Sweating.  Trembling or shaking.  Shortness of breath or feeling smothered.  Feeling choked.  Chest pain or discomfort.  Nausea or strange feeling in your stomach.  Dizziness, light-headedness, or feeling like you will faint.  Chills or hot flushes.  Numbness or tingling in your lips or hands and feet.  Feeling that things are not real or feeling that you are not yourself.  Fear of losing control or going crazy.  Fear of dying. Some of these symptoms can mimic serious medical conditions. For example, you may think you are having a heart attack. Although panic attacks can be very scary, they are not life threatening. DIAGNOSIS  Panic attacks are diagnosed through an  assessment by your health care provider. Your health care provider will ask questions about your symptoms, such as where and when they occurred. Your health care provider will also ask about your medical history and use of alcohol and drugs, including prescription medicines. Your health care provider may order blood tests or other studies to rule out a serious medical condition. Your health care provider may refer you to a mental health professional for further evaluation. TREATMENT   Most healthy people who have one or two panic attacks in an extreme, life-threatening situation will not require treatment.  The treatment for panic attacks associated with anxiety disorders or other mental illness typically involves counseling with a mental health professional, medicine, or a combination of both. Your health care provider will help determine what treatment is best for you.  Panic attacks due to physical illness usually go away with treatment of the illness. If prescription medicine is causing panic attacks, talk with your health care provider about stopping the medicine, decreasing the dose, or substituting another medicine.  Panic attacks due to alcohol or drug abuse go away with abstinence. Some adults need professional help in order to stop drinking or using drugs. HOME CARE INSTRUCTIONS   Take all medicines as directed by your health care provider.   Schedule and attend follow-up visits as directed by your health care provider. It is important to keep all your appointments. SEEK MEDICAL CARE IF:  You are not able to take your medicines as prescribed.  Your symptoms do not improve or get worse. SEEK IMMEDIATE MEDICAL CARE IF:   You experience panic attack symptoms that are different than your usual symptoms.  You have serious thoughts about  hurting yourself or others.  You are taking medicine for panic attacks and have a serious side effect. MAKE SURE YOU:  Understand these  instructions.  Will watch your condition.  Will get help right away if you are not doing well or get worse.   This information is not intended to replace advice given to you by your health care provider. Make sure you discuss any questions you have with your health care provider.   Document Released: 09/25/2005 Document Revised: 09/30/2013 Document Reviewed: 05/09/2013 Elsevier Interactive Patient Education [employer]2016 Elsevier Inc.

## 2016-02-11 NOTE — ED Notes (Signed)
Pt arrived POV required assistance from vehicle to wheelchair presenting with generalized weakness, hysterical c/o neck pain, and speaking broken sentences. Pts girlfriend states pt was c/o n/v, abd pain, and congestion at home, took cough meds and began c/o neck pain [facility].

## 2016-02-11 NOTE — ED Provider Notes (Signed)
MSE was initiated and I personally evaluated the patient and placed orders (if any) at  5:26 PM on Feb 11, 2016.  Otherwise healthy 25 y.o female presents the ED complaining of right arm pain, shoulder pain and congestion. I was told by registration that a pt in the parking lot needed assistance so I went outside to assess the pt. Pt was able to walk and transfer from car to wheelchair. Pt unable to give adequate hx due to rapid breathing. Per pts girlfriend, pt has had a cough/congestion and sore throat for the last week. Pt took tylenol cold and sinus this morning without relief and felt weak today. Pt and her girlfriend were en route to the ED when pt began hyperventilating and complaining of R shoulder and neck pain.   Pt is tachycardic to 111, distressed and breathing rapidly. Unable to answer questions due to hyperventilation. Heart sounds are normal, tachycardia present. Lungs CTAB. Intact distal pulses. No bruits. Neurologically intact. Able to follow commands. GCS 15. Feel that pt may be having a panic attack, but need to r/o other causes.   EKG obtained. Will  move pt to acute care side of ED.   The patient appears stable so that the remainder of the MSE may be completed by another provider.  Lester KinsmanSamantha Tripp Napoleonville[place], PA-C 02/11/16 1733  Lorre NickAnthony Allen, MD 02/19/16 [phone]

## 2016-02-11 NOTE — ED Notes (Signed)
Patient transported to X-ray 

## 2016-02-11 NOTE — Progress Notes (Signed)
EDCM spoke to patient at bedside. Patient confirms she [place] a pcp [place]  [facility], informed patient of services.  EDCM also provided patient with list of pcps who accept self pay patients, list of discount pharmacies and websites needymeds.org and GoodRX.com for medication assistance, phone number to inquire about the orange card, phone number to inquire about Medicaid, phone number to inquire about the Affordable Care Act, financial resources in the community such as local churches, [employer], urban ministries, and dental assistance for uninsured patients.  Patient thankful for resources.  No further EDCM needs at this time.

## 2017-02-23 ENCOUNTER — Encounter (HOSPITAL_COMMUNITY): Payer: Self-pay | Admitting: Emergency Medicine

## 2017-02-23 ENCOUNTER — Ambulatory Visit (HOSPITAL_COMMUNITY)
Admission: EM | Admit: 2017-02-23 | Discharge: 2017-02-23 | Disposition: A | Discharge status: Home / Self Care | Payer: Self-pay | Attending: Internal Medicine | Admitting: Internal Medicine

## 2017-02-23 DIAGNOSIS — J069 Acute upper respiratory infection, unspecified: Secondary | ICD-10-CM

## 2017-02-23 DIAGNOSIS — J4521 Mild intermittent asthma with (acute) exacerbation: Secondary | ICD-10-CM

## 2017-02-23 DIAGNOSIS — B9789 Other viral agents as the cause of diseases classified elsewhere: Secondary | ICD-10-CM

## 2017-02-23 MED ORDER — IPRATROPIUM-ALBUTEROL 0.5-2.5 (3) MG/3ML IN SOLN
3.0000 mL | Freq: Once | RESPIRATORY_TRACT | Status: AC
  Administered 2017-02-23: 3 mL via RESPIRATORY_TRACT

## 2017-02-23 MED ORDER — IPRATROPIUM-ALBUTEROL 0.5-2.5 (3) MG/3ML IN SOLN
RESPIRATORY_TRACT | Status: AC
  Filled 2017-02-23: qty 3

## 2017-02-23 MED ORDER — DEXAMETHASONE SODIUM PHOSPHATE 10 MG/ML IJ SOLN
10.0000 mg | Freq: Once | INTRAMUSCULAR | Status: AC
  Administered 2017-02-23: 10 mg via INTRAMUSCULAR

## 2017-02-23 MED ORDER — PREDNISONE 50 MG PO TABS: ORAL_TABLET | ORAL | 0 refills | Status: DC

## 2017-02-23 MED ORDER — DEXAMETHASONE SODIUM PHOSPHATE 10 MG/ML IJ SOLN
INTRAMUSCULAR | Status: AC
  Filled 2017-02-23: qty 1

## 2017-02-23 MED ORDER — BENZONATATE 100 MG PO CAPS: 100.0000 mg | ORAL_CAPSULE | Freq: Three times a day (TID) | ORAL | 0 refills | Status: DC

## 2017-02-23 MED ORDER — ALBUTEROL SULFATE HFA 108 (90 BASE) MCG/ACT IN AERS: 1.0000 | INHALATION_SPRAY | Freq: Four times a day (QID) | RESPIRATORY_TRACT | 0 refills | Status: DC | PRN

## 2017-02-23 NOTE — ED Provider Notes (Signed)
CSN: [id number]     Arrival date & time 02/23/17  1219 History   First MD Initiated Contact with Patient 02/23/17 1320     Chief Complaint  Patient presents with  . URI   (Consider location/radiation/quality/duration/timing/severity/associated sxs/prior Treatment) The history is provided by the patient.  URI  Presenting symptoms: congestion, cough and fatigue   Presenting symptoms: no fever, no rhinorrhea and no sore throat   Congestion:    Location:  Nasal   Interferes with sleep: yes     Interferes with eating/drinking: no   Cough:    Cough characteristics:  Non-productive, dry and hacking   Sputum characteristics:  Clear   Severity:  Severe   Onset quality:  Gradual   Duration:  4 days   Timing:  Constant   Progression:  Worsening   Chronicity:  New Severity:  Moderate Onset quality:  Gradual Duration:  3 days Timing:  Constant Chronicity:  New Relieved by:  Nothing Worsened by:  Breathing Ineffective treatments:  None tried Associated symptoms: sneezing and wheezing   Associated symptoms: no myalgias and no neck pain   Risk factors comment:  Hx of asthma   Past Medical History:  Diagnosis Date  . Panic attacks    History reviewed. No pertinent surgical history. History reviewed. No pertinent family history. Social History  Substance Use Topics  . Smoking status: Current Some Day Smoker    Types: Cigarettes  . Smokeless tobacco: Never Used  . Alcohol use Yes     Comment: 1 pack every 2 weeks   OB History    No data available     Review of Systems  Constitutional: Positive for fatigue. Negative for chills and fever.  HENT: Positive for congestion and sneezing. Negative for rhinorrhea and sore throat.   Respiratory: Positive for cough, shortness of breath and wheezing.   Cardiovascular: Negative for chest pain and palpitations.  Gastrointestinal: Negative for diarrhea, nausea and vomiting.  Musculoskeletal: Negative for myalgias, neck pain and neck  stiffness.  Skin: Negative.   Neurological: Negative.     Allergies  Morphine and related  Home Medications   Prior to Admission medications   Not on File   Meds Ordered and Administered this Visit   Medications  ipratropium-albuterol (DUONEB) 0.5-2.5 (3) MG/3ML nebulizer solution 3 mL (not administered)  dexamethasone (DECADRON) injection 10 mg (not administered)    BP (!) 114/58 (BP Location: Left Arm) Comment: notified rn  Pulse 84   Temp 98.8 F (37.1 C) (Oral)   Resp 12   LMP 02/23/2017   SpO2 100%  No data found.   Physical Exam  Constitutional: She is oriented to person, place, and time. She appears well-developed and well-nourished.  HENT:  Head: Normocephalic.  Right Ear: External ear normal.  Left Ear: External ear normal.  Mouth/Throat: Oropharynx is clear and moist.  Eyes: Conjunctivae are normal.  Neck: Normal range of motion.  Cardiovascular: Normal rate and regular rhythm.   Pulmonary/Chest: Effort normal. She has wheezes.  Lymphadenopathy:    She has no cervical adenopathy.  Neurological: She is alert and oriented to person, place, and time.  Skin: Skin is warm and dry. Capillary refill takes less than 2 seconds.  Psychiatric: She has a normal mood and affect. Her behavior is normal.  Nursing note and vitals reviewed.   Urgent Care Course     Procedures (including critical care time)  Labs Review Labs Reviewed - No data to display  Imaging Review No results found.  MDM   1. Viral URI with cough   2. Mild intermittent asthma with exacerbation    Viral URI with cough, provided counseling on over-the-counter therapies for symptom management.  For mild intermittent asthma, given DuoNeb in clinic along with dexamethasone injection. Lung sounds improved. Discharged home with albuterol inhaler, prednisone, and Tessalon.    Dorena Bodo, NP 02/23/17 1410

## 2017-02-23 NOTE — Discharge Instructions (Addendum)
Most likely have a viral URI, and is most likely has triggered your asthma. I prescribed prednisone, take one tablet daily with food, continue albuterol inhaler, do 1-2 puffs every 4-6 hours as needed for wheezing, or shortness of breath.For cough, I have prescribed a medication called Tessalon. Take 1 tablet every 8 hours as needed for your cough. I also recommend an over-the-counter antihistamine such as Claritin or Zyrtec daily for the remainder of the allergy season. If your symptoms persist past one week, or fail to resolve, return to clinic. If any time they worsen, consider going to the emergency room.

## 2017-02-23 NOTE — ED Triage Notes (Signed)
Here for cold sx onset 4 days associated w/prod cough, chest congestion, CP due to cough, ST, vomiting to cough  Was sent home from work today due to cough... Needing note for work  A&O x4... NAD... Ambulatory

## 2017-07-28 ENCOUNTER — Emergency Department (HOSPITAL_COMMUNITY)
Admission: EM | Admit: 2017-07-28 | Discharge: 2017-07-28 | Disposition: A | Discharge status: Home / Self Care | Payer: Self-pay | Attending: Emergency Medicine | Admitting: Emergency Medicine

## 2017-07-28 ENCOUNTER — Encounter (HOSPITAL_COMMUNITY): Payer: Self-pay | Admitting: Emergency Medicine

## 2017-07-28 ENCOUNTER — Emergency Department (HOSPITAL_COMMUNITY): Payer: Self-pay

## 2017-07-28 DIAGNOSIS — S6991XA Unspecified injury of right wrist, hand and finger(s), initial encounter: Secondary | ICD-10-CM | POA: Insufficient documentation

## 2017-07-28 DIAGNOSIS — Y939 Activity, unspecified: Secondary | ICD-10-CM | POA: Insufficient documentation

## 2017-07-28 DIAGNOSIS — Y999 Unspecified external cause status: Secondary | ICD-10-CM | POA: Insufficient documentation

## 2017-07-28 DIAGNOSIS — W2209XA Striking against other stationary object, initial encounter: Secondary | ICD-10-CM | POA: Insufficient documentation

## 2017-07-28 DIAGNOSIS — Y929 Unspecified place or not applicable: Secondary | ICD-10-CM | POA: Insufficient documentation

## 2017-07-28 DIAGNOSIS — F17210 Nicotine dependence, cigarettes, uncomplicated: Secondary | ICD-10-CM | POA: Insufficient documentation

## 2017-07-28 DIAGNOSIS — Z79899 Other long term (current) drug therapy: Secondary | ICD-10-CM | POA: Insufficient documentation

## 2017-07-28 MED ORDER — ACETAMINOPHEN 500 MG PO TABS: 500.0000 mg | ORAL_TABLET | Freq: Four times a day (QID) | ORAL | 0 refills | Status: DC | PRN

## 2017-07-28 MED ORDER — IBUPROFEN 600 MG PO TABS: 600.0000 mg | ORAL_TABLET | Freq: Four times a day (QID) | ORAL | 0 refills | Status: DC | PRN

## 2017-07-28 NOTE — Progress Notes (Signed)
Orthopedic Tech Progress Note Patient Details:  Carmen [name] 01-22-1991 [id number]  Ortho Devices Type of Ortho Device: Finger splint Ortho Device/Splint Interventions: Application   Saul FordyceJennifer C Gearldine Looney 07/28/2017, 7:22 AM

## 2017-07-28 NOTE — Discharge Instructions (Signed)
Medications: Ibuprofen, Tylenol  Treatment: Take ibuprofen every 6 hours as needed for your pain.  You can alternate with Tylenol in between.  Use ice 3-4 times daily alternating 20 minutes on, 20 minutes off.  Wear your splint at all times, except with bathing and washing your hands.  Follow-up: Please follow-up with the hand doctor, Dr. Mina MarbleWeingold, if your symptoms are not improving over the next week.  Please return to the emergency department if you develop any new or worsening symptoms.

## 2017-07-28 NOTE — ED Provider Notes (Signed)
[name] EMERGENCY DEPARTMENT Provider Note   CSN: [id number] Arrival date & time: 07/28/17  0132     History   Chief Complaint Chief Complaint  Patient presents with  . Finger Injury    HPI Carmen [name] is a 26 y.o. female with history of panic attacks who presents with pain in her right index finger.  Patient reports she was moving too fast and hit it on a door.  She has had pain to her PIP and difficulty moving her finger ever since.  She has noted some intermittent paresthesias.  She has had associated throbbing to her finger.  She denies any other injuries.  She did not try any medication or ice before she came.  HPI  Past Medical History:  Diagnosis Date  . Panic attacks     There are no active problems to display for this patient.   History reviewed. No pertinent surgical history.  OB History    No data available       Home Medications    Prior to Admission medications   Medication Sig Start Date End Date Taking? Authorizing Provider  acetaminophen (TYLENOL) 500 MG tablet Take 1 tablet (500 mg total) by mouth every 6 (six) hours as needed. 07/28/17   Aracelli Woloszyn, Waylan Boga, PA-C  albuterol (PROVENTIL HFA;VENTOLIN HFA) 108 (90 Base) MCG/ACT inhaler Inhale 1-2 puffs into the lungs every 6 (six) hours as needed for wheezing or shortness of breath. 02/23/17   Dorena Bodo, NP  benzonatate (TESSALON) 100 MG capsule Take 1 capsule (100 mg total) by mouth every 8 (eight) hours. 02/23/17   Dorena Bodo, NP  ibuprofen (ADVIL,MOTRIN) 600 MG tablet Take 1 tablet (600 mg total) by mouth every 6 (six) hours as needed. 07/28/17   Emi Holes, PA-C  predniSONE (DELTASONE) 50 MG tablet Take 1 tablet daily with food 02/23/17   Dorena Bodo, NP    Family History No family history on file.  Social History Social History  Substance Use Topics  . Smoking status: Current Some Day Smoker    Types: Cigarettes  . Smokeless tobacco: Never Used    . Alcohol use Yes     Comment: 1 pack every 2 weeks     Allergies   Morphine and related   Review of Systems Review of Systems  Musculoskeletal: Positive for arthralgias.  Skin: Negative for color change and wound.  Neurological: Negative for numbness.     Physical Exam Updated Vital Signs BP 118/85 (BP Location: Left Arm)   Pulse 86   Temp 98.3 F (36.8 C) (Oral)   Resp 18   Ht 4\' 11"  (1.499 m)   Wt 56.7 kg (125 lb)   SpO2 100%   BMI 25.25 kg/m   Physical Exam  Constitutional: She appears well-developed and well-nourished. No distress.  HENT:  Head: Normocephalic and atraumatic.  Mouth/Throat: Oropharynx is clear and moist. No oropharyngeal exudate.  Eyes: Pupils are equal, round, and reactive to light. Conjunctivae are normal. Right eye exhibits no discharge. Left eye exhibits no discharge. No scleral icterus.  Neck: Normal range of motion. Neck supple. No thyromegaly present.  Cardiovascular: Normal rate, regular rhythm, normal heart sounds and intact distal pulses.  Exam reveals no gallop and no friction rub.   No murmur heard. Pulmonary/Chest: Effort normal and breath sounds normal. No stridor. No respiratory distress. She has no wheezes. She has no rales.  Musculoskeletal: She exhibits no edema.       Right wrist: Normal.  Right hand: She exhibits decreased range of motion, tenderness, bony tenderness and swelling (2nd PIP). She exhibits normal capillary refill. Normal sensation noted.  R hand: swelling and tenderness to R index finger, limited ROM due to pain, but intact flexion, extension, abduction, adduction; Pain with lateral deviation of PIP, potential mild instability, but difficult to assess due to pain; sensation intact; cap refill less than 2 secs  Lymphadenopathy:    She has no cervical adenopathy.  Neurological: She is alert. Coordination normal.  Skin: Skin is warm and dry. No rash noted. She is not diaphoretic. No pallor.  Psychiatric: She  has a normal mood and affect.  Nursing note and vitals reviewed.    ED Treatments / Results  Labs (all labs ordered are listed, but only abnormal results are displayed) Labs Reviewed - No data to display  EKG  EKG Interpretation None       Radiology Dg Hand Complete Right  Result Date: 07/28/2017 CLINICAL DATA:  Pain and swelling of the right second finger after injury. Unable to bend the finger. EXAM: RIGHT HAND - COMPLETE 3+ VIEW COMPARISON:  None. FINDINGS: There is no evidence of fracture or dislocation. There is no evidence of arthropathy or other focal bone abnormality. Soft tissues are unremarkable. IMPRESSION: Negative. Electronically Signed   By: Burman NievesWilliam  Stevens M.D.   On: 07/28/2017 02:35    Procedures Procedures (including critical care time)  Medications Ordered in ED Medications - No data to display   Initial Impression / Assessment and Plan / ED Course  I have reviewed the triage vital signs and the nursing notes.  Pertinent labs & imaging results that were available during my care of the patient were reviewed by me and considered in my medical decision making (see chart for details).     Patient with injury to right index finger.  X-ray is negative.  Placed in static finger splint and follow-up to hand surgery if symptoms are not improving.  Could be simple jammed finger, however cannot rule out soft tissue injury.  Patient given resources to follow-up with hand if not improving over the next week.  Supportive treatment discussed including ice and NSAIDs.  Return precautions discussed.  Patient understands and agrees with plan.  Patient vitals stable throughout ED course and discharged in satisfactory condition.  Final Clinical Impressions(s) / ED Diagnoses   Final diagnoses:  Injury of finger of right hand, initial encounter    New Prescriptions New Prescriptions   ACETAMINOPHEN (TYLENOL) 500 MG TABLET    Take 1 tablet (500 mg total) by mouth every 6  (six) hours as needed.   IBUPROFEN (ADVIL,MOTRIN) 600 MG TABLET    Take 1 tablet (600 mg total) by mouth every 6 (six) hours as needed.     Emi HolesLaw, Reyan Helle M, PA-C 07/28/17 [id number]    Dione BoozeGlick, David, MD 07/28/17 [phone]

## 2017-07-28 NOTE — ED Triage Notes (Signed)
Reports hitting right pointer finger on the door just before arrival.   C/o pain and swelling.  Can not bend finger.

## 2017-10-09 NOTE — L&D Delivery Note (Addendum)
Patient: Carmen [name] MRN: [id number]  GBS status: negative, IAP given no  Patient is a 27 y.o. now G1P1001 s/p NSVD at 3568w2d, who was admitted for IOL 2/2 PE on lovenox. AROM 26h 6690m prior to delivery with 500mL fluid at time of delivery.    Delivery Note At 1:46 PM a viable female was delivered via Vaginal, Spontaneous (Presentation:LOA ).  APGAR: 1, 6; weight 7 lb 10.1 oz (3460 g).   Placenta status: intact, .  Cord:  3 vessel with the following complications: prolonged time in canal and tachycardia with hypoxia s/p delivery with terminal meconium.  Cord pH: NA  Anesthesia:  epidural Episiotomy: None Lacerations: 2nd degree Suture Repair: 3.0 vicryl, and monocryl  Est. Blood Loss (mL):  1326mL  Mom to postpartum.  Baby to Couplet care / Skin to Skin.  [name] 08/29/2018, 2:49 PM    Head delivered LOA. Nuchal cord present and removed with delivery of the body. Shoulder and body delivered in usual fashion- after delay from tight maternal external tissues. Infant was listless after delivery and was vigorously rubbed/stimulated and airway suctioned- cord was cut and infant was moved immediately to the warmer. Pediatric team was called STAT. Infant received mask ventilation before spontaneously breathing independently and beginning to cry. APGAR scores improved from 1 to 6. Cord blood was obtained. Placenta delivered spontaneously with gentle cord traction. Mother had moderate blood loss of EBL 1326mL, mostly from lacerations. Fundus firm with massage and Pitocin. Mother also received 1 dose of cytotec. Perineum inspected and found to have 2nd degree stellate laceration posterior and interior vaginal wall. It was repaired with ~20 stitches with good hemostasis achieved. She received an ice pack to apply to the tears to decrease swelling. Immediately post delivery- mother was mentating well, in good spirits, had stable vital signs with increased HR 142 and BP 148/75. She endorsed  adequate pain management.  Will order a repeat CBC and track Hgb. Will continue to monitor for signs of anemia.

## 2017-10-29 ENCOUNTER — Ambulatory Visit (HOSPITAL_COMMUNITY): Admission: EM | Admit: 2017-10-29 | Discharge: 2017-10-29 | Discharge status: Against Medical Advice | Payer: Self-pay

## 2017-10-29 NOTE — ED Triage Notes (Signed)
Unable to locate patient, called several times.

## 2017-10-29 NOTE — ED Notes (Signed)
Called several times no answer. 

## 2017-11-12 ENCOUNTER — Ambulatory Visit (HOSPITAL_COMMUNITY)
Admission: EM | Admit: 2017-11-12 | Discharge: 2017-11-12 | Disposition: A | Discharge status: Home / Self Care | Payer: Self-pay | Attending: Family Medicine | Admitting: Family Medicine

## 2017-11-12 ENCOUNTER — Other Ambulatory Visit: Payer: Self-pay

## 2017-11-12 ENCOUNTER — Encounter (HOSPITAL_COMMUNITY): Payer: Self-pay | Admitting: Emergency Medicine

## 2017-11-12 DIAGNOSIS — Z3202 Encounter for pregnancy test, result negative: Secondary | ICD-10-CM

## 2017-11-12 DIAGNOSIS — N760 Acute vaginitis: Secondary | ICD-10-CM | POA: Insufficient documentation

## 2017-11-12 DIAGNOSIS — Z711 Person with feared health complaint in whom no diagnosis is made: Secondary | ICD-10-CM

## 2017-11-12 DIAGNOSIS — N9089 Other specified noninflammatory disorders of vulva and perineum: Secondary | ICD-10-CM

## 2017-11-12 DIAGNOSIS — L739 Follicular disorder, unspecified: Secondary | ICD-10-CM

## 2017-11-12 DIAGNOSIS — N898 Other specified noninflammatory disorders of vagina: Secondary | ICD-10-CM

## 2017-11-12 DIAGNOSIS — Z113 Encounter for screening for infections with a predominantly sexual mode of transmission: Secondary | ICD-10-CM

## 2017-11-12 DIAGNOSIS — B9689 Other specified bacterial agents as the cause of diseases classified elsewhere: Secondary | ICD-10-CM | POA: Insufficient documentation

## 2017-11-12 LAB — POCT URINALYSIS DIP (DEVICE)
BILIRUBIN URINE: NEGATIVE
Glucose, UA: NEGATIVE mg/dL
KETONES UR: NEGATIVE mg/dL
Leukocytes, UA: NEGATIVE
Nitrite: NEGATIVE
Protein, ur: NEGATIVE mg/dL
Urobilinogen, UA: 0.2 mg/dL (ref 0.0–1.0)
pH: 5.5 (ref 5.0–8.0)

## 2017-11-12 LAB — POCT PREGNANCY, URINE: PREG TEST UR: NEGATIVE

## 2017-11-12 MED ORDER — CEFTRIAXONE SODIUM 250 MG IJ SOLR
INTRAMUSCULAR | Status: AC
  Filled 2017-11-12: qty 250

## 2017-11-12 MED ORDER — CEPHALEXIN 500 MG PO CAPS: 500.0000 mg | ORAL_CAPSULE | Freq: Four times a day (QID) | ORAL | 0 refills | Status: AC

## 2017-11-12 MED ORDER — AZITHROMYCIN 250 MG PO TABS
1000.0000 mg | ORAL_TABLET | Freq: Once | ORAL | Status: AC
  Administered 2017-11-12: 1000 mg via ORAL

## 2017-11-12 MED ORDER — AZITHROMYCIN 250 MG PO TABS
ORAL_TABLET | ORAL | Status: AC
  Filled 2017-11-12: qty 4

## 2017-11-12 MED ORDER — METRONIDAZOLE 500 MG PO TABS: 500.0000 mg | ORAL_TABLET | Freq: Two times a day (BID) | ORAL | 0 refills | Status: AC

## 2017-11-12 MED ORDER — CEFTRIAXONE SODIUM 250 MG IJ SOLR
250.0000 mg | Freq: Once | INTRAMUSCULAR | Status: AC
  Administered 2017-11-12: 250 mg via INTRAMUSCULAR

## 2017-11-12 NOTE — ED Triage Notes (Signed)
[facility] a [facility], discomfort and some urinary frequency. The patient also complained of a possible abscess in her vaginal area.

## 2017-11-12 NOTE — Discharge Instructions (Signed)
Warm compresses to raised lesion to promote drainage, complete course of antibiotics. We treated you today for gonorrhea and chlamydia.  We will notify you of any positive tests and if any changes to medications are needed. Metronidazole for presumed bacterial vaginosis.  Please withhold from intercourse for the next week. Please use condoms to prevent STD's.  If symptoms worsen or do not improve in the next week to return to be seen or to follow up with your primary care provider or with gynecology.

## 2017-11-12 NOTE — ED Provider Notes (Signed)
[facility]    CSN: [id number] Arrival date & time: 11/12/17  1010     History   Chief Complaint Chief Complaint  Patient presents with  . Vaginitis  . Abscess    HPI Carmen [name] is a 27 y.o. female.   Carmen [name] present with complaints of vaginal odor and milky discharge which she has had for approximately 1 week. Occasionally with vaginal itching and irritation, worse while working. States that her fiance cheated on her therefore she is concerned about exposure to STD. Also with drainage lesion to pubic area, states has had 1 previous which healed on its own. Mildly tender. Has had cysts in the past. Without feers, abdominal pain, back pain. Mild urinary frequency, denies dysuria. LMP 1/27. She is not on birth control. No other medications she takes daily.    ROS per HPI.       Past Medical History:  Diagnosis Date  . Panic attacks     There are no active problems to display for this patient.   History reviewed. No pertinent surgical history.  OB History    No data available       Home Medications    Prior to Admission medications   Medication Sig Start Date End Date Taking? Authorizing Provider  cephALEXin (KEFLEX) 500 MG capsule Take 1 capsule (500 mg total) by mouth 4 (four) times daily for 7 days. 11/12/17 11/19/17  Georgetta HaberBurky, Natalie B, NP  metroNIDAZOLE (FLAGYL) 500 MG tablet Take 1 tablet (500 mg total) by mouth 2 (two) times daily for 7 days. 11/12/17 11/19/17  Georgetta HaberBurky, Natalie B, NP    Family History History reviewed. No pertinent family history.  Social History Social History   Tobacco Use  . Smoking status: Current Some Day Smoker    Types: Cigarettes  . Smokeless tobacco: Never Used  Substance Use Topics  . Alcohol use: Yes    Comment: 1 pack every 2 weeks  . Drug use: No     Allergies   Morphine and related   Review of Systems Review of Systems   Physical Exam Triage Vital Signs ED Triage Vitals  Enc Vitals Group     BP  11/12/17 1034 103/76     Pulse Rate 11/12/17 1034 73     Resp 11/12/17 1034 16     Temp 11/12/17 1034 98.2 F (36.8 C)     Temp Source 11/12/17 1034 Oral     SpO2 11/12/17 1034 99 %     Weight --      Height --      Head Circumference --      Peak Flow --      Pain Score 11/12/17 1036 7     Pain Loc --      Pain Edu? --      Excl. in GC? --    No data found.  Updated Vital Signs BP 103/76 (BP Location: Left Arm)   Pulse 73   Temp 98.2 F (36.8 C) (Oral)   Resp 16   LMP 11/04/2017 (Exact Date)   SpO2 99%   Visual Acuity Right Eye Distance:   Left Eye Distance:   Bilateral Distance:    Right Eye Near:   Left Eye Near:    Bilateral Near:     Physical Exam  Constitutional: She is oriented to person, place, and time. She appears well-developed and well-nourished. No distress.  Cardiovascular: Normal rate, regular rhythm and normal heart sounds.  Pulmonary/Chest: Effort normal and  breath sounds normal.  Abdominal: Soft. She exhibits no mass. There is no tenderness. There is no guarding.    Raised firm papule to left lower pubic area, with yellow underlying pus, firm tissue surrounding, round, mildly tender; without active drainage  Genitourinary: Vagina normal and uterus normal. There is no rash, lesion or injury on the right labia. There is no rash, lesion or injury on the left labia. Cervix exhibits no motion tenderness and no friability. Right adnexum displays no mass, no tenderness and no fullness. Left adnexum displays no mass, no tenderness and no fullness.  Genitourinary Comments: Thin white/clear discharge  Neurological: She is alert and oriented to person, place, and time.  Skin: Skin is warm and dry.     UC Treatments / Results  Labs (all labs ordered are listed, but only abnormal results are displayed) Labs Reviewed  POCT URINALYSIS DIP (DEVICE) - Abnormal; Notable for the following components:      Result Value   Hgb urine dipstick MODERATE (*)    All  other components within normal limits  POCT PREGNANCY, URINE  URINE CYTOLOGY ANCILLARY ONLY    EKG  EKG Interpretation None       Radiology No results found.  Procedures Procedures (including critical care time)  Medications Ordered in UC Medications  azithromycin (ZITHROMAX) tablet 1,000 mg (not administered)  cefTRIAXone (ROCEPHIN) injection 250 mg (not administered)     Initial Impression / Assessment and Plan / UC Course  I have reviewed the triage vital signs and the nursing notes.  Pertinent labs & imaging results that were available during my care of the patient were reviewed by me and considered in my medical decision making (see chart for details).     Patient declined I&d to boil/folliculitis to pubic region at this time, keflex provided. Warm compresses to promote drainage encouraged. Provided empiric STD coverage with zithromax and rocephin, flagyl provided. Will notify of any positive findings and if any changes to treatment are needed.   If symptoms worsen or do not improve in the next week to return to be seen or to follow up with PCP.  Patient verbalized understanding and agreeable to plan.    Final Clinical Impressions(s) / UC Diagnoses   Final diagnoses:  Folliculitis  Vaginal discharge  Concern about STD in female without diagnosis    ED Discharge Orders        Ordered    cephALEXin (KEFLEX) 500 MG capsule  4 times daily     11/12/17 1100    metroNIDAZOLE (FLAGYL) 500 MG tablet  2 times daily     11/12/17 1100       Controlled Substance Prescriptions [place] Controlled Substance Registry consulted? Not Applicable   Georgetta Haber, NP 11/12/17 1107    Linus Mako B, NP 11/12/17 1108

## 2017-11-13 LAB — URINE CYTOLOGY ANCILLARY ONLY
Chlamydia: NEGATIVE
Neisseria Gonorrhea: NEGATIVE
TRICH (WINDOWPATH): NEGATIVE

## 2017-11-14 LAB — URINE CYTOLOGY ANCILLARY ONLY: Candida vaginitis: NEGATIVE

## 2017-11-16 ENCOUNTER — Ambulatory Visit: Payer: Self-pay | Admitting: *Deleted

## 2017-11-16 NOTE — Telephone Encounter (Signed)
  Answer Assessment - Initial Assessment Questions 1. SYMPTOMS: "Do you have any symptoms?"     Nausea and diarrhea 2. SEVERITY: If symptoms are present, ask "Are they mild, moderate or severe?"    Mild  Carmen [name] was [facility] keflex and flag[facility]. She has been experiencing some nausea and diarrhea since.  Protocols used: MEDICATION QUESTION CALL-A-AH

## 2017-12-24 ENCOUNTER — Encounter (HOSPITAL_COMMUNITY): Payer: Self-pay | Admitting: Emergency Medicine

## 2017-12-24 ENCOUNTER — Ambulatory Visit (HOSPITAL_COMMUNITY)
Admission: EM | Admit: 2017-12-24 | Discharge: 2017-12-24 | Disposition: A | Discharge status: Home / Self Care | Payer: Managed Care, Other (non HMO) | Attending: Internal Medicine | Admitting: Internal Medicine

## 2017-12-24 ENCOUNTER — Telehealth (HOSPITAL_COMMUNITY): Payer: Self-pay | Admitting: Emergency Medicine

## 2017-12-24 DIAGNOSIS — R197 Diarrhea, unspecified: Secondary | ICD-10-CM

## 2017-12-24 DIAGNOSIS — Z3201 Encounter for pregnancy test, result positive: Secondary | ICD-10-CM

## 2017-12-24 LAB — POCT URINALYSIS DIP (DEVICE)
BILIRUBIN URINE: NEGATIVE
Glucose, UA: NEGATIVE mg/dL
Ketones, ur: NEGATIVE mg/dL
Leukocytes, UA: NEGATIVE
NITRITE: NEGATIVE
PH: 6.5 (ref 5.0–8.0)
Protein, ur: NEGATIVE mg/dL
Specific Gravity, Urine: >=1.03 (ref 1.005–1.030)
Urobilinogen, UA: 0.2 mg/dL (ref 0.0–1.0)

## 2017-12-24 LAB — POCT PREGNANCY, URINE
PREG TEST UR: POSITIVE — AB
Preg Test, Ur: POSITIVE — AB

## 2017-12-24 MED ORDER — ONDANSETRON 4 MG PO TBDP: 4.0000 mg | ORAL_TABLET | Freq: Once | ORAL | Status: DC

## 2017-12-24 MED ORDER — PRENATAL COMPLETE 14-0.4 MG PO TABS: 1.0000 | ORAL_TABLET | Freq: Every day | ORAL | 0 refills | Status: DC

## 2017-12-24 MED ORDER — ONDANSETRON HCL 4 MG PO TABS: 4.0000 mg | ORAL_TABLET | Freq: Three times a day (TID) | ORAL | 0 refills | Status: DC | PRN

## 2017-12-24 NOTE — ED Triage Notes (Signed)
Pt c/o stomach pain, lower, for a few days. Pt also c/o headaches. Pt states "theres been a stomach bug going around at my job". Denies vomiting, states she has some nausea and diarrhea.

## 2017-12-24 NOTE — ED Provider Notes (Signed)
[facility]    CSN: [id number] Arrival date & time: 12/24/17  0957     History   Chief Complaint Chief Complaint  Patient presents with  . Abdominal Pain  . Diarrhea    HPI Carmen [name] is a 27 y.o. female.   Carmen [name] presents with complaints of nausea, loose stools and abdominal cramping which started three days ago. No known fevers. Denies urinary symptoms, vaginal bleeding, uri symptoms, blood to stool, vomiting. She has been eating and drinking. Without recent travel or exposure to known contaminant. States there has been a GI bug at her workplace among coworkers. Denies any previous similar. States her LMP was approximately 1 month ago, she does not recall the specific date. She is not on birth control. No previous pregnancies.    ROS per HPI.       Past Medical History:  Diagnosis Date  . Panic attacks     There are no active problems to display for this patient.   History reviewed. No pertinent surgical history.  OB History    No data available       Home Medications    Prior to Admission medications   Medication Sig Start Date End Date Taking? Authorizing Provider  Prenatal Vit-Fe Fumarate-FA (PRENATAL COMPLETE) 14-0.4 MG TABS Take 1 tablet by mouth daily. 12/24/17   Georgetta Haber, NP    Family History No family history on file.  Social History Social History   Tobacco Use  . Smoking status: Current Some Day Smoker    Types: Cigarettes  . Smokeless tobacco: Never Used  Substance Use Topics  . Alcohol use: Yes    Comment: 1 pack every 2 weeks  . Drug use: No     Allergies   Morphine and related   Review of Systems Review of Systems   Physical Exam Triage Vital Signs ED Triage Vitals  Enc Vitals Group     BP 12/24/17 1016 107/73     Pulse Rate 12/24/17 1016 73     Resp 12/24/17 1016 18     Temp 12/24/17 1016 (!) 97.5 F (36.4 C)     Temp src --      SpO2 12/24/17 1016 100 %     Weight --      Height --    Head Circumference --      Peak Flow --      Pain Score 12/24/17 1017 8     Pain Loc --      Pain Edu? --      Excl. in GC? --    No data found.  Updated Vital Signs BP 107/73   Pulse 73   Temp (!) 97.5 F (36.4 C)   Resp 18   LMP 12/05/2017   SpO2 100%   Visual Acuity Right Eye Distance:   Left Eye Distance:   Bilateral Distance:    Right Eye Near:   Left Eye Near:    Bilateral Near:     Physical Exam  Constitutional: She is oriented to person, place, and time. She appears well-developed and well-nourished. No distress.  Cardiovascular: Normal rate, regular rhythm and normal heart sounds.  Pulmonary/Chest: Effort normal and breath sounds normal.  Abdominal: Soft. Bowel sounds are normal. There is generalized tenderness. There is no CVA tenderness.  Neurological: She is alert and oriented to person, place, and time.  Skin: Skin is warm and dry.     UC Treatments / Results  Labs (all labs ordered  are listed, but only abnormal results are displayed) Labs Reviewed  POCT URINALYSIS DIP (DEVICE) - Abnormal; Notable for the following components:      Result Value   Hgb urine dipstick TRACE (*)    All other components within normal limits  POCT PREGNANCY, URINE - Abnormal; Notable for the following components:   Preg Test, Ur POSITIVE (*)    All other components within normal limits  POCT PREGNANCY, URINE    EKG  EKG Interpretation None       Radiology No results found.  Procedures Procedures (including critical care time)  Medications Ordered in UC Medications - No data to display   Initial Impression / Assessment and Plan / UC Course  I have reviewed the triage vital signs and the nursing notes.  Pertinent labs & imaging results that were available during my care of the patient were reviewed by me and considered in my medical decision making (see chart for details).     Positive pregnancy test in clinic today. Approximately 4-[redacted] weeks pregnant as  patient states she knows she had a period "mid month" in February. Without urinary or vaginal symptoms. Nausea likely related to morning sickness. Non specific abdominal pain on exam. Encouraged establish with Ob for first appointment. Return precautions provided. Patient verbalized understanding and agreeable to plan.    Final Clinical Impressions(s) / UC Diagnoses   Final diagnoses:  Diarrhea, unspecified type  Positive pregnancy test    ED Discharge Orders        Ordered    ondansetron (ZOFRAN) 4 MG tablet  Every 8 hours PRN,   Status:  Discontinued     12/24/17 1030    Prenatal Vit-Fe Fumarate-FA (PRENATAL COMPLETE) 14-0.4 MG TABS  Daily     12/24/17 1040       Controlled Substance Prescriptions [place] Controlled Substance Registry consulted? Not Applicable   Georgetta HaberBurky, Natalie B, NP 12/24/17 1046

## 2017-12-24 NOTE — Discharge Instructions (Addendum)
Small frequent sips of fluids to ensure adequate hydration. Daily prenatal vitamin. Tylenol as needed for pain. Establish with an OB for first appointment. If develop worsening of symptoms, abdominal pain, vaginal bleeding, dehydration, or otherwise worsening please go to ER or women's center.

## 2017-12-28 ENCOUNTER — Emergency Department (HOSPITAL_COMMUNITY): Payer: Medicaid Other

## 2017-12-28 ENCOUNTER — Encounter (HOSPITAL_COMMUNITY): Payer: Self-pay | Admitting: Emergency Medicine

## 2017-12-28 ENCOUNTER — Emergency Department (HOSPITAL_COMMUNITY)
Admission: EM | Admit: 2017-12-28 | Discharge: 2017-12-28 | Disposition: A | Discharge status: Home / Self Care | Payer: Medicaid Other | Attending: Emergency Medicine | Admitting: Emergency Medicine

## 2017-12-28 DIAGNOSIS — R0789 Other chest pain: Secondary | ICD-10-CM | POA: Diagnosis not present

## 2017-12-28 DIAGNOSIS — F17210 Nicotine dependence, cigarettes, uncomplicated: Secondary | ICD-10-CM | POA: Insufficient documentation

## 2017-12-28 DIAGNOSIS — R079 Chest pain, unspecified: Secondary | ICD-10-CM

## 2017-12-28 DIAGNOSIS — O9989 Other specified diseases and conditions complicating pregnancy, childbirth and the puerperium: Secondary | ICD-10-CM | POA: Insufficient documentation

## 2017-12-28 LAB — CBC
HCT: 38.1 % (ref 36.0–46.0)
Hemoglobin: 12.7 g/dL (ref 12.0–15.0)
MCH: 29.2 pg (ref 26.0–34.0)
MCHC: 33.3 g/dL (ref 30.0–36.0)
MCV: 87.6 fL (ref 78.0–100.0)
PLATELETS: 217 10*3/uL (ref 150–400)
RBC: 4.35 MIL/uL (ref 3.87–5.11)
RDW: 13 % (ref 11.5–15.5)
WBC: 6.8 10*3/uL (ref 4.0–10.5)

## 2017-12-28 LAB — BASIC METABOLIC PANEL
Anion gap: 11 (ref 5–15)
BUN: 20 mg/dL (ref 6–20)
CALCIUM: 9.7 mg/dL (ref 8.9–10.3)
CHLORIDE: 104 mmol/L (ref 101–111)
CO2: 21 mmol/L — ABNORMAL LOW (ref 22–32)
CREATININE: 0.88 mg/dL (ref 0.44–1.00)
GFR calc Af Amer: >60 mL/min (ref >60)
GFR calc non Af Amer: >60 mL/min (ref >60)
GLUCOSE: 84 mg/dL (ref 65–99)
Potassium: 3.7 mmol/L (ref 3.5–5.1)
Sodium: 136 mmol/L (ref 135–145)

## 2017-12-28 LAB — I-STAT BETA HCG BLOOD, ED (MC, WL, AP ONLY): I-stat hCG, quantitative: 356.9 m[IU]/mL — ABNORMAL HIGH (ref <5)

## 2017-12-28 LAB — I-STAT TROPONIN, ED: TROPONIN I, POC: 0 ng/mL (ref 0.00–0.08)

## 2017-12-28 NOTE — ED Triage Notes (Signed)
Pt reports chest pain and upper back pain since yesterday.  Pt is "less than two months pregnant."  States she has had to "work hard to breath."  Pt reports no emesis, loose stool, dizziness, loc.

## 2017-12-28 NOTE — ED Provider Notes (Signed)
[facility] Provider Note   CSN: [id number] Arrival date & time: 12/28/17  0301     History   Chief Complaint Chief Complaint  Patient presents with  . Chest Pain  . Back Pain    HPI Carmen [name] is a 27 y.o. female.  Patient with past medical history remarkable for panic attacks presents to the emergency department with a chief complaint of chest tightness which she says started yesterday.  She reports having had a hard time catching her breath.  The symptoms have resolved now.  She denies any fever, chills, cough.  Denies any wheezing.  She denies any lower extremity pain or swelling.  Denies any recent surgery or immobilization.  She is about 1 month pregnant.  She has her first follow-up appointment next week.  She denies any other associated symptoms.  The history is provided by the patient. No language interpreter was used.    Past Medical History:  Diagnosis Date  . Panic attacks     There are no active problems to display for this patient.   History reviewed. No pertinent surgical history.  OB History   None      Home Medications    Prior to Admission medications   Medication Sig Start Date End Date Taking? Authorizing Provider  Prenatal Vit-Fe Fumarate-FA (PRENATAL COMPLETE) 14-0.4 MG TABS Take 1 tablet by mouth daily. 12/24/17  Yes Georgetta HaberBurky, Natalie B, NP    Family History No family history on file.  Social History Social History   Tobacco Use  . Smoking status: Current Some Day Smoker    Types: Cigarettes  . Smokeless tobacco: Never Used  Substance Use Topics  . Alcohol use: Yes    Comment: 1 pack every 2 weeks  . Drug use: No     Allergies   Morphine and related   Review of Systems Review of Systems  All other systems reviewed and are negative.    Physical Exam Updated Vital Signs BP 102/69   Pulse 82   Temp 97.8 F (36.6 C) (Oral)   Resp 18   LMP 12/05/2017   SpO2 100%   Physical  Exam  Constitutional: She is oriented to person, place, and time. She appears well-developed and well-nourished.  HENT:  Head: Normocephalic and atraumatic.  Eyes: Pupils are equal, round, and reactive to light. Conjunctivae and EOM are normal.  Neck: Normal range of motion. Neck supple.  Cardiovascular: Normal rate and regular rhythm. Exam reveals no gallop and no friction rub.  No murmur heard. Pulmonary/Chest: Effort normal and breath sounds normal. No respiratory distress. She has no wheezes. She has no rales. She exhibits no tenderness.  Abdominal: Soft. Bowel sounds are normal. She exhibits no distension and no mass. There is no tenderness. There is no rebound and no guarding.  Musculoskeletal: Normal range of motion. She exhibits no edema or tenderness.  Neurological: She is alert and oriented to person, place, and time.  Skin: Skin is warm and dry.  Psychiatric: She has a normal mood and affect. Her behavior is normal. Judgment and thought content normal.  Nursing note and vitals reviewed.    ED Treatments / Results  Labs (all labs ordered are listed, but only abnormal results are displayed) Labs Reviewed  BASIC METABOLIC PANEL - Abnormal; Notable for the following components:      Result Value   CO2 21 (*)    All other components within normal limits  I-STAT BETA HCG BLOOD, ED (  [facility], [facility], [facility] ONLY) - Abnormal; Notable for the following components:   I-stat hCG, quantitative 356.9 (*)    All other components within normal limits  CBC  I-STAT TROPONIN, ED    EKG  EKG Interpretation  Date/Time:  Friday December 28 2017 03:05:06 EDT Ventricular Rate:  81 PR Interval:  108 QRS Duration: 82 QT Interval:  336 QTC Calculation: 390 R Axis:   43 Text Interpretation:  Sinus rhythm with short PR with Fusion complexes Otherwise normal ECG When compared with ECG of 12/12/2015, HEART RATE has decreased Nonspecific ST abnormality is no longer present Confirmed by Dione Booze ([id number]) on  12/28/2017 3:10:38 AM       Radiology Dg Chest 2 View  Result Date: 12/28/2017 CLINICAL DATA:  Chest pain. Patient reports currently pregnant in first-trimester pregnancy. EXAM: CHEST - 2 VIEW COMPARISON:  02/11/2016 FINDINGS: The cardiomediastinal contours are normal. The lungs are clear. Pulmonary vasculature is normal. No consolidation, pleural effusion, or pneumothorax. No acute osseous abnormalities are seen. IMPRESSION: Unremarkable radiograph of the chest. Electronically Signed   By: Rubye Oaks M.D.   On: 12/28/2017 05:40    Procedures Procedures (including critical care time)  Medications Ordered in ED Medications - No data to display   Initial Impression / Assessment and Plan / ED Course  I have reviewed the triage vital signs and the nursing notes.  Pertinent labs & imaging results that were available during my care of the patient were reviewed by me and considered in my medical decision making (see chart for details).     Patient with chest pain and shortness of breath yesterday.  Symptoms have essentially resolved now.  No ischemic EKG changes.  Troponin is negative.  Doubt ACS.  Doubt PE, patient is not tachycardic nor hypoxic, and has no symptoms now.  She does have a history of panic attacks, and this sounds more consistent with the with the patient describes the symptoms.  She is in no acute distress.  Lung sounds are clear to auscultation.  Will recommend close follow-up with OB/GYN and PCP.  Return precautions given.  Final Clinical Impressions(s) / ED Diagnoses   Final diagnoses:  Chest pain, unspecified type    ED Discharge Orders    None       Roxy Horseman, PA-C 12/28/17 6045    Ward, Layla Maw, DO 12/28/17 [phone]

## 2017-12-30 ENCOUNTER — Emergency Department (HOSPITAL_COMMUNITY)
Admission: EM | Admit: 2017-12-30 | Discharge: 2017-12-30 | Disposition: A | Discharge status: Home / Self Care | Payer: Medicaid Other | Attending: Emergency Medicine | Admitting: Emergency Medicine

## 2017-12-30 ENCOUNTER — Encounter (HOSPITAL_COMMUNITY): Payer: Self-pay | Admitting: *Deleted

## 2017-12-30 ENCOUNTER — Other Ambulatory Visit: Payer: Self-pay

## 2017-12-30 DIAGNOSIS — O9989 Other specified diseases and conditions complicating pregnancy, childbirth and the puerperium: Secondary | ICD-10-CM | POA: Diagnosis present

## 2017-12-30 DIAGNOSIS — Z3490 Encounter for supervision of normal pregnancy, unspecified, unspecified trimester: Secondary | ICD-10-CM

## 2017-12-30 DIAGNOSIS — F17210 Nicotine dependence, cigarettes, uncomplicated: Secondary | ICD-10-CM | POA: Insufficient documentation

## 2017-12-30 DIAGNOSIS — N760 Acute vaginitis: Secondary | ICD-10-CM | POA: Diagnosis not present

## 2017-12-30 DIAGNOSIS — Z3A01 Less than 8 weeks gestation of pregnancy: Secondary | ICD-10-CM | POA: Insufficient documentation

## 2017-12-30 DIAGNOSIS — N898 Other specified noninflammatory disorders of vagina: Secondary | ICD-10-CM

## 2017-12-30 LAB — URINALYSIS, ROUTINE W REFLEX MICROSCOPIC
BACTERIA UA: NONE SEEN
Bilirubin Urine: NEGATIVE
Glucose, UA: NEGATIVE mg/dL
Ketones, ur: NEGATIVE mg/dL
NITRITE: NEGATIVE
Protein, ur: NEGATIVE mg/dL
SPECIFIC GRAVITY, URINE: 1.026 (ref 1.005–1.030)
pH: 5 (ref 5.0–8.0)

## 2017-12-30 NOTE — Discharge Instructions (Addendum)
Your symptoms are likely from the wipes you used causing some mild irritation to the vaginal area. Avoid using those wipes again. Avoid harsh soaps, and don't use douches. Use gentle soap, perform warm sitz baths to help with symptoms, and use tylenol as needed for pain. Continue your usual home medications including prenatal vitamins. Follow up with your OBGYN on Thursday for your already scheduled visit, to establish prenatal care, and to recheck your symptoms. [facility]similar to their version of an ER) for emergent changes or worsening symptoms.

## 2017-12-30 NOTE — ED Notes (Signed)
Bed: WA03 Expected date:  Expected time:  Means of arrival:  Comments: 

## 2017-12-30 NOTE — ED Notes (Signed)
Pt states she has a cyst on her vaginal area she would like to have drained.  Mercedes PA made aware.

## 2017-12-30 NOTE — ED Notes (Signed)
PA at bedside.

## 2017-12-30 NOTE — ED Triage Notes (Signed)
Pt states she has tenderness/ pain when wiping and has a cyst that is draining. She is [redacted] weeks pregnant

## 2017-12-30 NOTE — ED Provider Notes (Signed)
[facility]-EMERGENCY DEPT Provider Note   CSN: [id number] Arrival date & time: 12/30/17  1503     History   Chief Complaint Chief Complaint  Patient presents with  . Vaginal Pain    HPI Carmen [name] is a 27 y.o. G1P0 female currently about [redacted] weeks pregnant (LMP 12/02/17, EGA based on LMP [redacted]w[redacted]d), with a PMHx of panic attacks, who presents to the ED with complaints of vaginal irritation x5 days.  Patient states that she had intercourse on Tuesday, 5 days ago, and afterwards went to the restroom and used a wet wipe that she had never used before.  After that she noticed that her vaginal mucosa/labia minora areas were tender when she used the wipes, and since then she has had some tenderness only when she wipes or touches the area.  She states this is gradually improving, however she was worried because she found that she was pregnant the day before and so she was not sure whether this could affect the pregnancy, so she came in for evaluation.  She states that the only new thing she came into contact with was the new wet wipe she had never used before.  She describes the irritation as 9/10 intermittent with wiping throbbing nonradiating vaginal mucosal rotation, worse with wiping and palpation of the area, and with no treatments tried prior to arrival.  She mentions that she had a cyst on her pubic area which is not currently causing any difficulties or acutely different, denies any increase in size of the cyst, erythema or warmth, or drainage from the cyst.  LMP was 12/02/17.  She is sexually active with one female partner, unprotected.  She mentions also that she has intermittent nausea related to morning sickness which she expected because she is pregnant.  She has an OB/GYN appointment on Thursday (in 4 days) to establish prenatal care.  Of note, chart review reveals that she was seen at [facility] on 11/12/17 for a "bump" on her vaginal area (which is the cyst she mentioned today), she  declined I&D of an area of folliculitis, was sent home with keflex for that; urine cytology showed +BV but negative for GC/CT/Trich/Yeast, and she was sent home with flagyl for the BV.  States she completed those medications and has not had any issues with that since then.   She denies fevers, chills, CP, SOB, abd pain, V/D/C, hematuria, dysuria, vaginal discharge/bleeding, vaginal itching, other genital sores, myalgias, arthralgias, numbness, tingling, focal weakness, or any other complaints at this time.   The history is provided by the patient and medical records. No language interpreter was used.  Vaginal Pain  This is a new problem. The current episode started more than 2 days ago. The problem occurs daily. The problem has been rapidly improving. Pertinent negatives include no chest pain, no abdominal pain and no shortness of breath. Exacerbated by: wiping or touching the area. Nothing relieves the symptoms. She has tried nothing for the symptoms. The treatment provided no relief.    Past Medical History:  Diagnosis Date  . Panic attacks     There are no active problems to display for this patient.   History reviewed. No pertinent surgical history.   OB History    Gravida  1   Para      Term      Preterm      AB      Living        SAB  TAB      Ectopic      Multiple      Live Births               Home Medications    Prior to Admission medications   Medication Sig Start Date End Date Taking? Authorizing Provider  Prenatal Vit-Fe Fumarate-FA (PRENATAL COMPLETE) 14-0.4 MG TABS Take 1 tablet by mouth daily. 12/24/17   Georgetta Haber, NP    Family History No family history on file.  Social History Social History   Tobacco Use  . Smoking status: Current Some Day Smoker    Types: Cigarettes  . Smokeless tobacco: Never Used  Substance Use Topics  . Alcohol use: Yes    Comment: 1 pack every 2 weeks  . Drug use: No     Allergies   Morphine and  related   Review of Systems Review of Systems  Constitutional: Negative for chills and fever.  Respiratory: Negative for shortness of breath.   Cardiovascular: Negative for chest pain.  Gastrointestinal: Positive for nausea (intermittent, morning sickness related). Negative for abdominal pain, constipation, diarrhea and vomiting.  Genitourinary: Positive for vaginal pain. Negative for dysuria, genital sores, hematuria, vaginal bleeding and vaginal discharge.  Musculoskeletal: Negative for arthralgias and myalgias.  Skin: Negative for color change.  Allergic/Immunologic: Negative for immunocompromised state.  Neurological: Negative for weakness and numbness.  Psychiatric/Behavioral: Negative for confusion.   All other systems reviewed and are negative for acute change except as noted in the HPI.    Physical Exam Updated Vital Signs BP 93/63 (BP Location: Left Arm)   Pulse 87   Temp 99.2 F (37.3 C) (Oral)   Resp 16   Ht 4\' 11"  (1.499 m)   Wt 53.6 kg (118 lb 4 oz)   LMP 12/03/2017 Comment: pt shielded  SpO2 100%   BMI 23.88 kg/m   Physical Exam  Constitutional: She is oriented to person, place, and time. Vital signs are normal. She appears well-developed and well-nourished.  Non-toxic appearance. No distress.  Afebrile, nontoxic, NAD  HENT:  Head: Normocephalic and atraumatic.  Mouth/Throat: Oropharynx is clear and moist and mucous membranes are normal.  Eyes: Conjunctivae and EOM are normal. Right eye exhibits no discharge. Left eye exhibits no discharge.  Neck: Normal range of motion. Neck supple.  Cardiovascular: Normal rate, regular rhythm, normal heart sounds and intact distal pulses. Exam reveals no gallop and no friction rub.  No murmur heard. Pulmonary/Chest: Effort normal and breath sounds normal. No respiratory distress. She has no decreased breath sounds. She has no wheezes. She has no rhonchi. She has no rales.  Abdominal: Soft. Normal appearance and bowel sounds  are normal. She exhibits no distension. There is no tenderness. There is no rigidity, no rebound, no guarding, no CVA tenderness, no tenderness at McBurney's point and negative Murphy's sign.  Soft, NTND, +BS throughout, no r/g/r, neg murphy's, neg mcburney's, no CVA TTP   Genitourinary: Pelvic exam was performed with patient supine. There is tenderness on the right labia. There is no rash, lesion or injury on the right labia. There is tenderness on the left labia. There is no rash, lesion or injury on the left labia. There is tenderness in the vagina. No erythema or bleeding in the vagina. No signs of injury around the vagina. No vaginal discharge found.  Genitourinary Comments: Chaperone present for exam Limited pelvic exam performed, no speculum exam or bimanual exam performed Small circular cyst on left mons, no erythema or  warmth/drainage to this area, nonTTP. External vaginal areas without rashes, lesions, or ulcerations/cysts, no bartholin's gland cysts, minimal tenderness to vaginal mucosa at introitus and labia minora. No erythema, injury, or wounds to vaginal mucosa. No  vaginal discharge or bleeding noted at the introitus. Did not assess adnexa/cervix/uterus otherwise.   Musculoskeletal: Normal range of motion.  Neurological: She is alert and oriented to person, place, and time. She has normal strength. No sensory deficit.  Skin: Skin is warm, dry and intact. No rash noted.  Psychiatric: She has a normal mood and affect.  Nursing note and vitals reviewed.    ED Treatments / Results  Labs (all labs ordered are listed, but only abnormal results are displayed) Labs Reviewed  URINALYSIS, ROUTINE W REFLEX MICROSCOPIC - Abnormal; Notable for the following components:      Result Value   Hgb urine dipstick SMALL (*)    Leukocytes, UA MODERATE (*)    Squamous Epithelial / LPF 0-5 (*)    All other components within normal limits  URINE CULTURE    EKG None  Radiology No results  found.  Procedures Procedures (including critical care time)  Medications Ordered in ED Medications - No data to display   Initial Impression / Assessment and Plan / ED Course  I have reviewed the triage vital signs and the nursing notes.  Pertinent labs & imaging results that were available during my care of the patient were reviewed by me and considered in my medical decision making (see chart for details).     27 y.o. female here with 5 days of vaginal irritation after using a wipe after intercourse that she's never used before. Symptoms gradually improving but she was worried because she's pregnant, so she came in for evaluation. States that the pain is only present when she wipes the vaginal mucosa/labia minora areas. No genital sores or lesions (has a cyst on pubic bone area but this is not acute and not currently an issue). No vaginal bleeding/discharge. On exam, no abdominal tenderness, limited pelvic performed and no genital sores or lesions noted to labia or introitus, no lacerations or abrasions, no bartholin's gland cysts, minimal tenderness to vaginal mucosa, no discharge or bleeding. Doubt need for full pelvic exam, this was likely a contact vaginitis issue from the wipes; doubt need for STD testing as she just had this done 1 month ago and it was negative. Will get U/A to ensure no UTI to cause these symptoms. Will reassess shortly.   8:31 PM U/A with slight vaginal contamination given the 0-5 squamous cells and +mucus present, no evidence of UTI (no bacteria seen, 0-5 WBCs). Will send for UCx given that she's pregnant, but doubt this is a UTI. Likely contact vaginitis from the wet wipe she used. Advised using gentle soap, avoidance of douching and harsh soaps, avoidance of irritating wipes, use of warm sitz baths and tylenol for pain, and f/up with OBGYN in 4 days for her already scheduled visit, to establish prenatal care and for recheck of symptoms. I explained the diagnosis and  have given explicit precautions to return to the ER including for any other new or worsening symptoms. The patient understands and accepts the medical plan as it's been dictated and I have answered their questions. Discharge instructions concerning home care and prescriptions have been given. The patient is STABLE and is discharged to home in good condition.    Final Clinical Impressions(s) / ED Diagnoses   Final diagnoses:  Contact vaginitis  Vaginal  irritation  Early stage of pregnancy    ED Discharge Orders    [address]None       Lorieann Argueta, HumnokeMercedes, [place] JerseyPA-C 12/30/17 2031    Melene PlanFloyd, Dan, DO 12/30/17 2306

## 2018-01-01 LAB — URINE CULTURE

## 2018-01-10 ENCOUNTER — Inpatient Hospital Stay (HOSPITAL_COMMUNITY)
Admission: AD | Admit: 2018-01-10 | Discharge: 2018-01-11 | Disposition: A | Discharge status: Home / Self Care | Payer: Managed Care, Other (non HMO) | Source: Ambulatory Visit | Attending: Obstetrics & Gynecology | Admitting: Obstetrics & Gynecology

## 2018-01-10 ENCOUNTER — Encounter (HOSPITAL_COMMUNITY): Payer: Self-pay | Admitting: *Deleted

## 2018-01-10 DIAGNOSIS — O219 Vomiting of pregnancy, unspecified: Secondary | ICD-10-CM

## 2018-01-10 DIAGNOSIS — O210 Mild hyperemesis gravidarum: Secondary | ICD-10-CM | POA: Diagnosis present

## 2018-01-10 DIAGNOSIS — Z87891 Personal history of nicotine dependence: Secondary | ICD-10-CM | POA: Diagnosis not present

## 2018-01-10 DIAGNOSIS — Z885 Allergy status to narcotic agent status: Secondary | ICD-10-CM | POA: Insufficient documentation

## 2018-01-10 DIAGNOSIS — Z3A01 Less than 8 weeks gestation of pregnancy: Secondary | ICD-10-CM | POA: Diagnosis not present

## 2018-01-10 LAB — URINALYSIS, ROUTINE W REFLEX MICROSCOPIC
BILIRUBIN URINE: NEGATIVE
Glucose, UA: NEGATIVE mg/dL
HGB URINE DIPSTICK: NEGATIVE
Ketones, ur: NEGATIVE mg/dL
Leukocytes, UA: NEGATIVE
Nitrite: NEGATIVE
Protein, ur: NEGATIVE mg/dL
SPECIFIC GRAVITY, URINE: 1.025 (ref 1.005–1.030)
pH: 6 (ref 5.0–8.0)

## 2018-01-10 MED ORDER — PROMETHAZINE HCL 25 MG PO TABS
25.0000 mg | ORAL_TABLET | Freq: Once | ORAL | Status: AC
  Administered 2018-01-10: 25 mg via ORAL
  Filled 2018-01-10: qty 1

## 2018-01-10 NOTE — MAU Provider Note (Signed)
Chief Complaint: No chief complaint on file.   First Provider Initiated Contact with Patient 01/10/18 2312        SUBJECTIVE HPI: Carmen [name] is a 27 y.o. G1P0 at [redacted]w[redacted]d by LMP who presents to maternity admissions reporting nausea and vomiting.  No pain now.  Able to keep some things down.  Has not taken any meds. She denies vaginal bleeding, vaginal itching/burning, urinary symptoms, h/a, dizziness, n/v, or fever/chills.   Emesis   This is a new problem. The current episode started 1 to 4 weeks ago. The problem occurs 2 to 4 times per day. The problem has been unchanged. There has been no fever. Pertinent negatives include no abdominal pain, chills, diarrhea, dizziness, fever or myalgias. She has tried nothing for the symptoms.    RN note: PT SAYS SHE WENT  TO [facility] ON 3-22  FOR BACK PAIN -   DREW LABS.    STARTED N/V - LAST NIGHT 10PM.      FEELS WEAK   NO PAIN- NO CRAMPS.  PLAN TO GET [facility]-   [facility]- APPOINTMENT  4-25    Past Medical History:  Diagnosis Date  . Panic attacks    No past surgical history on file. Social History   Socioeconomic History  . Marital status: Single    Spouse name: Not on file  . Number of children: Not on file  . Years of education: Not on file  . Highest education level: Not on file  Occupational History  . Not on file  Social Needs  . Financial resource strain: Not on file  . Food insecurity:    Worry: Not on file    Inability: Not on file  . Transportation needs:    Medical: Not on file    Non-medical: Not on file  Tobacco Use  . Smoking status: Former Smoker    Types: Cigarettes  . Smokeless tobacco: Never Used  Substance and Sexual Activity  . Alcohol use: Yes    Comment: 1 pack every 2 weeks  . Drug use: No  . Sexual activity: Yes  Lifestyle  . Physical activity:    Days per week: Not on file    Minutes per session: Not on file  . Stress: Not on file  Relationships  . Social connections:    Talks on phone: Not on file    Gets  together: Not on file    Attends religious service: Not on file    Active member of club or organization: Not on file    Attends meetings of clubs or organizations: Not on file    Relationship status: Not on file  . Intimate partner violence:    Fear of current or ex partner: Not on file    Emotionally abused: Not on file    Physically abused: Not on file    Forced sexual activity: Not on file  Other Topics Concern  . Not on file  Social History Narrative  . Not on file   No current facility-administered medications on file prior to encounter.    Current Outpatient Medications on File Prior to Encounter  Medication Sig Dispense Refill  . Prenatal Vit-Fe Fumarate-FA (PRENATAL COMPLETE) 14-0.4 MG TABS Take 1 tablet by mouth daily. 60 each 0   Allergies  Allergen Reactions  . Morphine And Related Anaphylaxis    I have reviewed patient's Past Medical Hx, Surgical Hx, Family Hx, Social Hx, medications and allergies.   ROS:  Review of Systems  Constitutional: Negative  for chills and fever.  Gastrointestinal: Positive for vomiting. Negative for abdominal pain and diarrhea.  Musculoskeletal: Negative for myalgias.  Neurological: Negative for dizziness.   Review of Systems  Other systems negative   Physical Exam  Physical Exam Patient Vitals for the past 24 hrs:  BP Temp Temp src Pulse Resp Height Weight  01/10/18 2230 96/64 98.7 F (37.1 C) Oral 76 20 4\' 11"  (1.499 m) 118 lb (53.5 kg)   Constitutional: Well-developed female in no acute distress.  Cardiovascular: normal rate Respiratory: normal effort GI: Abd soft, non-tender. Pos BS x 4 MS: Extremities nontender, no edema, normal ROM Neurologic: Alert and oriented x 4.  GU: Neg CVAT.  PELVIC EXAM: deferred  LAB RESULTS Results for orders placed or performed during the hospital encounter of 01/10/18 (from the past 24 hour(s))  Urinalysis, Routine w reflex microscopic     Status: None   Collection Time: 01/10/18 10:32  PM  Result Value Ref Range   Color, Urine YELLOW YELLOW   APPearance CLEAR CLEAR   Specific Gravity, Urine 1.025 1.005 - 1.030   pH 6.0 5.0 - 8.0   Glucose, UA NEGATIVE NEGATIVE mg/dL   Hgb urine dipstick NEGATIVE NEGATIVE   Bilirubin Urine NEGATIVE NEGATIVE   Ketones, ur NEGATIVE NEGATIVE mg/dL   Protein, ur NEGATIVE NEGATIVE mg/dL   Nitrite NEGATIVE NEGATIVE   Leukocytes, UA NEGATIVE NEGATIVE       IMAGING   MAU Management/MDM: Discussed nausea and vomiting of pregnancy Discussed treatment options including medications. Will try Phenergan tonight and discharge home on Diclegis Does not want Zofran at this time, risks reviewed Able to keep down fluids after Phenergan  ASSESSMENT Pregnancy at [id number] Nausea and vomiting of pregnancy  PLAN Discharge home Rx Diclegis with instructions on how to take it Follow up at HD for New OB appt as scheduled Pt stable at time of discharge. Encouraged to return here or to other Urgent Care/ED if she develops worsening of symptoms, increase in pain, fever, or other concerning symptoms.    Wynelle BourgeoisMarie Din Bookwalter CNM, MSN Certified Nurse-Midwife 01/10/2018  11:13 PM

## 2018-01-10 NOTE — MAU Note (Addendum)
PT SAYS SHE WENT  TO [facility] ON 3-22  FOR BACK PAIN -   DREW LABS.    STARTED N/V - LAST NIGHT 10PM.      FEELS WEAK   NO PAIN- NO CRAMPS.  [facility]

## 2018-01-11 DIAGNOSIS — O219 Vomiting of pregnancy, unspecified: Secondary | ICD-10-CM | POA: Diagnosis not present

## 2018-01-11 DIAGNOSIS — O210 Mild hyperemesis gravidarum: Secondary | ICD-10-CM | POA: Diagnosis not present

## 2018-01-11 MED ORDER — DOXYLAMINE-PYRIDOXINE 10-10 MG PO TBEC: 1.0000 | DELAYED_RELEASE_TABLET | Freq: Every evening | ORAL | 5 refills | Status: DC | PRN

## 2018-01-11 NOTE — Discharge Instructions (Signed)
Morning Sickness Morning sickness is when you feel sick to your stomach (nauseous) during pregnancy. This nauseous feeling may or may not come with vomiting. It often occurs in the morning but can be a problem any time of day. Morning sickness is most common during the first trimester, but it may continue throughout pregnancy. While morning sickness is unpleasant, it is usually harmless unless you develop severe and continual vomiting (hyperemesis gravidarum). This condition requires more intense treatment. What are the causes? The cause of morning sickness is not completely known but seems to be related to normal hormonal changes that occur in pregnancy. What increases the risk? You are at greater risk if you:  Experienced nausea or vomiting before your pregnancy.  Had morning sickness during a previous pregnancy.  Are pregnant with more than one baby, such as twins.  How is this treated? Do not use any medicines (prescription, over-the-counter, or herbal) for morning sickness without first talking to your health care provider. Your health care provider may prescribe or recommend:  Vitamin B6 supplements.  Anti-nausea medicines.  The herbal medicine ginger.  Follow these instructions at home:  Only take over-the-counter or prescription medicines as directed by your health care provider.  Taking multivitamins before getting pregnant can prevent or decrease the severity of morning sickness in most women.  Eat a piece of dry toast or unsalted crackers before getting out of bed in the morning.  Eat five or six small meals a day.  Eat dry and bland foods (rice, baked potato). Foods high in carbohydrates are often helpful.  Do not drink liquids with your meals. Drink liquids between meals.  Avoid greasy, fatty, and spicy foods.  Get someone to cook for you if the smell of any food causes nausea and vomiting.  If you feel nauseous after taking prenatal vitamins, take the vitamins at  night or with a snack.  Snack on protein foods (nuts, yogurt, cheese) between meals if you are hungry.  Eat unsweetened gelatins for desserts.  Wearing an acupressure wristband (worn for sea sickness) may be helpful.  Acupuncture may be helpful.  Do not smoke.  Get a humidifier to keep the air in your house free of odors.  Get plenty of fresh air. Contact a health care provider if:  Your home remedies are not working, and you need medicine.  You feel dizzy or lightheaded.  You are losing weight. Get help right away if:  You have persistent and uncontrolled nausea and vomiting.  You pass out (faint). This information is not intended to replace advice given to you by your health care provider. Make sure you discuss any questions you have with your health care provider. Document Released: 11/16/2006 Document Revised: 03/02/2016 Document Reviewed: 03/12/2013 Elsevier Interactive Patient Education  2017 Elsevier Inc.  NAUSEA MEDICATION (Diclegis)  Take 2 tablets at bedtime. If symptoms persist, add 1 tab in the AM starting on day 3. If symptoms persist, add 1 tab in the PM starting day 4.

## 2018-01-14 ENCOUNTER — Inpatient Hospital Stay (HOSPITAL_COMMUNITY)
Admission: AD | Admit: 2018-01-14 | Discharge: 2018-01-15 | Disposition: A | Discharge status: Home / Self Care | Payer: Managed Care, Other (non HMO) | Source: Ambulatory Visit | Attending: Obstetrics & Gynecology | Admitting: Obstetrics & Gynecology

## 2018-01-14 ENCOUNTER — Encounter (HOSPITAL_COMMUNITY): Payer: Self-pay | Admitting: *Deleted

## 2018-01-14 DIAGNOSIS — O210 Mild hyperemesis gravidarum: Secondary | ICD-10-CM | POA: Diagnosis not present

## 2018-01-14 DIAGNOSIS — Z87891 Personal history of nicotine dependence: Secondary | ICD-10-CM | POA: Insufficient documentation

## 2018-01-14 DIAGNOSIS — Z3A01 Less than 8 weeks gestation of pregnancy: Secondary | ICD-10-CM | POA: Diagnosis not present

## 2018-01-14 LAB — URINALYSIS, ROUTINE W REFLEX MICROSCOPIC
BILIRUBIN URINE: NEGATIVE
Glucose, UA: NEGATIVE mg/dL
HGB URINE DIPSTICK: NEGATIVE
Ketones, ur: 20 mg/dL — AB
NITRITE: NEGATIVE
PH: 5 (ref 5.0–8.0)
Protein, ur: 30 mg/dL — AB
SPECIFIC GRAVITY, URINE: 1.026 (ref 1.005–1.030)

## 2018-01-14 LAB — POCT PREGNANCY, URINE: Preg Test, Ur: POSITIVE — AB

## 2018-01-14 MED ORDER — SODIUM CHLORIDE 0.9 % IV SOLN
8.0000 mg | Freq: Once | INTRAVENOUS | Status: AC
  Administered 2018-01-15: 8 mg via INTRAVENOUS
  Filled 2018-01-14: qty 4

## 2018-01-14 MED ORDER — LACTATED RINGERS IV BOLUS: 500.0000 mL | Freq: Once | INTRAVENOUS | Status: DC

## 2018-01-14 NOTE — MAU Note (Signed)
Pt here with c/o nausea and vomiting; can't keep anything down. Also has pain in left side of back, goes up into neck.

## 2018-01-15 DIAGNOSIS — O210 Mild hyperemesis gravidarum: Secondary | ICD-10-CM

## 2018-01-15 LAB — COMPREHENSIVE METABOLIC PANEL
ALBUMIN: 4.1 g/dL (ref 3.5–5.0)
ALT: 14 U/L (ref 14–54)
ANION GAP: 9 (ref 5–15)
AST: 16 U/L (ref 15–41)
Alkaline Phosphatase: 60 U/L (ref 38–126)
BUN: 11 mg/dL (ref 6–20)
CHLORIDE: 104 mmol/L (ref 101–111)
CO2: 24 mmol/L (ref 22–32)
Calcium: 9.3 mg/dL (ref 8.9–10.3)
Creatinine, Ser: 0.6 mg/dL (ref 0.44–1.00)
GFR calc Af Amer: >60 mL/min (ref >60)
GFR calc non Af Amer: >60 mL/min (ref >60)
GLUCOSE: 103 mg/dL — AB (ref 65–99)
POTASSIUM: 4.2 mmol/L (ref 3.5–5.1)
SODIUM: 137 mmol/L (ref 135–145)
Total Bilirubin: 0.9 mg/dL (ref 0.3–1.2)
Total Protein: 7.5 g/dL (ref 6.5–8.1)

## 2018-01-15 LAB — CBC
HCT: 37.3 % (ref 36.0–46.0)
HEMOGLOBIN: 12.7 g/dL (ref 12.0–15.0)
MCH: 29.5 pg (ref 26.0–34.0)
MCHC: 34 g/dL (ref 30.0–36.0)
MCV: 86.5 fL (ref 78.0–100.0)
Platelets: 221 10*3/uL (ref 150–400)
RBC: 4.31 MIL/uL (ref 3.87–5.11)
RDW: 13 % (ref 11.5–15.5)
WBC: 8 10*3/uL (ref 4.0–10.5)

## 2018-01-15 MED ORDER — ONDANSETRON HCL 8 MG PO TABS: 8.0000 mg | ORAL_TABLET | Freq: Three times a day (TID) | ORAL | 1 refills | Status: DC | PRN

## 2018-01-15 MED ORDER — FAMOTIDINE 20 MG PO TABS: 20.0000 mg | ORAL_TABLET | Freq: Two times a day (BID) | ORAL | 1 refills | Status: DC

## 2018-01-15 NOTE — MAU Provider Note (Addendum)
Patient Carmen [name] is a 27 y.o. G1P0 At 6136w3d here with complaints of nausea and vomiting that is on-going. She denies bleeding, vaginal discharge or burning with urination or abdominal pain.  History     CSN: [id number]  Arrival date and time: 01/14/18 1948   First Provider Initiated Contact with Patient 01/15/18 0025      Chief Complaint  Patient presents with  . Emesis   Emesis   This is a new problem. The current episode started in the past 7 days. The problem occurs 5 to 10 times per day. The problem has been unchanged. The emesis has an appearance of bile and stomach contents. There has been no fever. Pertinent negatives include no chills, coughing, diarrhea, dizziness or fever.  Patient was seen in MAU on 4-4; her nausea was treated with phenergan. She was sent home on Diclegis, which she stopped taking after two days  bc she said it hasn't been working. She is here tonight because she is unable to keep food down.    OB History    Gravida  1   Para      Term      Preterm      AB      Living        SAB      TAB      Ectopic      Multiple      Live Births              Past Medical History:  Diagnosis Date  . Panic attacks     No past surgical history on file.  No family history on file.  Social History   Tobacco Use  . Smoking status: Former Smoker    Types: Cigarettes  . Smokeless tobacco: Never Used  Substance Use Topics  . Alcohol use: Yes    Comment: 1 pack every 2 weeks  . Drug use: No    Allergies:  Allergies  Allergen Reactions  . Morphine And Related Anaphylaxis    Medications Prior to Admission  Medication Sig Dispense Refill Last Dose  . Doxylamine-Pyridoxine 10-10 MG TBEC Take 1 tablet by mouth at bedtime as needed (nausea). 100 tablet 5   . Prenatal Vit-Fe Fumarate-FA (PRENATAL COMPLETE) 14-0.4 MG TABS Take 1 tablet by mouth daily. 60 each 0 Past Week at Unknown time    Review of Systems  Constitutional: Negative.   Negative for chills and fever.  HENT: Negative.   Respiratory: Negative.  Negative for cough.   Cardiovascular: Negative.   Gastrointestinal: Positive for nausea and vomiting. Negative for diarrhea.  Genitourinary: Negative for vaginal bleeding and vaginal discharge.  Musculoskeletal: Positive for back pain.  Neurological: Negative.  Negative for dizziness.  Psychiatric/Behavioral: Negative.    Physical Exam   Blood pressure 96/62, pulse 84, temperature 98.3 F (36.8 C), temperature source Oral, resp. rate 18, height 4\' 11"  (1.499 m), weight 114 lb (51.7 kg), last menstrual period 12/01/2017, SpO2 98 %.  Physical Exam  Constitutional: She appears well-developed.  HENT:  Head: Normocephalic.  Neck: Normal range of motion.  Respiratory: Effort normal.  GI: Soft.  Musculoskeletal: Normal range of motion.  Neurological: She is alert.  Skin: Skin is warm.  Psychiatric: She has a normal mood and affect.    MAU Course  Procedures  MDM IV LR and 8 mg of zofran CBC normal CMP normal Patient's nausea has subsided; she ate two packs of crackers and a cup of ginger ale.  She feels better and wants to go home.  UA: 20 ketones; Urine culture sent.  Assessment and Plan   1. Morning sickness    2. Patient stable for discharge with RX for Zofran and pepcid. Encouraged patient to continue to take her zofran, even if she doesn't feel sick. 3. Encouraged light meals every 2 hours, consisting of high protein, low-sugar items.  4. Patient and partner verbalized understanding; all questions answered. Stable for discharge with return precautions reviewed.    Charlesetta Garibaldi Kooistra 01/15/2018, 12:27 AM

## 2018-01-15 NOTE — Discharge Instructions (Signed)

## 2018-01-16 LAB — CULTURE, OB URINE

## 2018-01-24 ENCOUNTER — Encounter (HOSPITAL_COMMUNITY): Payer: Self-pay | Admitting: *Deleted

## 2018-01-24 ENCOUNTER — Inpatient Hospital Stay (HOSPITAL_COMMUNITY)
Admission: AD | Admit: 2018-01-24 | Discharge: 2018-01-25 | Disposition: A | Discharge status: Home / Self Care | Payer: Managed Care, Other (non HMO) | Source: Ambulatory Visit | Attending: Obstetrics & Gynecology | Admitting: Obstetrics & Gynecology

## 2018-01-24 DIAGNOSIS — Z87891 Personal history of nicotine dependence: Secondary | ICD-10-CM | POA: Diagnosis not present

## 2018-01-24 DIAGNOSIS — R112 Nausea with vomiting, unspecified: Secondary | ICD-10-CM

## 2018-01-24 DIAGNOSIS — Z3A01 Less than 8 weeks gestation of pregnancy: Secondary | ICD-10-CM | POA: Insufficient documentation

## 2018-01-24 DIAGNOSIS — O210 Mild hyperemesis gravidarum: Secondary | ICD-10-CM | POA: Diagnosis present

## 2018-01-24 DIAGNOSIS — G2402 Drug induced acute dystonia: Secondary | ICD-10-CM

## 2018-01-24 NOTE — MAU Note (Addendum)
PT SAYS VOMITING STARTED  2 WEEKS AGO.   CAME HERE 4-8- WE GAVE MEDS - ZOFRAN-   STOPPED WORKING . TONIGHT  ZOFRAN AT 4PM.   NO CRAMPING  [facility]  [facility]

## 2018-01-25 DIAGNOSIS — R112 Nausea with vomiting, unspecified: Secondary | ICD-10-CM | POA: Diagnosis not present

## 2018-01-25 DIAGNOSIS — O210 Mild hyperemesis gravidarum: Secondary | ICD-10-CM | POA: Diagnosis not present

## 2018-01-25 LAB — BASIC METABOLIC PANEL
Anion gap: 9 (ref 5–15)
BUN: 7 mg/dL (ref 6–20)
CALCIUM: 9.6 mg/dL (ref 8.9–10.3)
CO2: 25 mmol/L (ref 22–32)
Chloride: 104 mmol/L (ref 101–111)
Creatinine, Ser: 0.61 mg/dL (ref 0.44–1.00)
GFR calc Af Amer: >60 mL/min (ref >60)
GLUCOSE: 90 mg/dL (ref 65–99)
POTASSIUM: 4.2 mmol/L (ref 3.5–5.1)
Sodium: 138 mmol/L (ref 135–145)

## 2018-01-25 LAB — URINALYSIS, ROUTINE W REFLEX MICROSCOPIC
Bilirubin Urine: NEGATIVE
GLUCOSE, UA: NEGATIVE mg/dL
HGB URINE DIPSTICK: NEGATIVE
KETONES UR: NEGATIVE mg/dL
Leukocytes, UA: NEGATIVE
Nitrite: NEGATIVE
PROTEIN: NEGATIVE mg/dL
Specific Gravity, Urine: 1.023 (ref 1.005–1.030)
pH: 6 (ref 5.0–8.0)

## 2018-01-25 MED ORDER — FAMOTIDINE IN NACL 20-0.9 MG/50ML-% IV SOLN
20.0000 mg | Freq: Once | INTRAVENOUS | Status: AC
  Administered 2018-01-25: 20 mg via INTRAVENOUS
  Filled 2018-01-25: qty 50

## 2018-01-25 MED ORDER — DIPHENHYDRAMINE HCL 50 MG/ML IJ SOLN
25.0000 mg | Freq: Once | INTRAMUSCULAR | Status: AC
  Administered 2018-01-25: 25 mg via INTRAVENOUS
  Filled 2018-01-25: qty 1

## 2018-01-25 MED ORDER — DEXAMETHASONE SODIUM PHOSPHATE 10 MG/ML IJ SOLN
10.0000 mg | Freq: Once | INTRAMUSCULAR | Status: AC
  Administered 2018-01-25: 10 mg via INTRAVENOUS
  Filled 2018-01-25: qty 1

## 2018-01-25 MED ORDER — SODIUM CHLORIDE 0.9 % IV SOLN
INTRAVENOUS | Status: DC
  Administered 2018-01-25: 04:00:00 via INTRAVENOUS

## 2018-01-25 MED ORDER — DIPHENHYDRAMINE HCL 50 MG/ML IJ SOLN
25.0000 mg | Freq: Once | INTRAMUSCULAR | Status: DC
  Filled 2018-01-25: qty 1

## 2018-01-25 MED ORDER — SODIUM CHLORIDE 0.9 % IV SOLN
25.0000 mg | Freq: Once | INTRAVENOUS | Status: AC
  Administered 2018-01-25: 25 mg via INTRAVENOUS
  Filled 2018-01-25: qty 1

## 2018-01-25 NOTE — Discharge Instructions (Signed)
Dystonic Reaction Dystonia is a condition that makes your muscles contract without warning (muscle spasms). It can cause unwanted, uncomfortable jerking of muscle groups. This condition is rarely life threatening. What are the causes? A dystonic reaction is most often a side effect of a particular medicine.These reactions occur when the normal patterns of the nerve receptors are upset by a particular medicine, and the imbalance causes multiple types of muscle spasm.  In some cases, the cause is not known (idiopathic). What increases the risk? This condition is more likely to develop in people who take certain medicines, most often medicines that are used to treat psychiatric conditions or nausea. What are the signs or symptoms? Symptoms of dystonia can vary. Symptoms may include:  Muscle twitches or spasms around your eyes (blepharospasm).  Foot cramping or dragging.  Pulling of your neck to one side (torticollis) or backward (retrocollis).  Muscles spasms of your face.  Spasms of your voice box (larynx).  Tremors.  Awkward and painful positions.  Muscle cramping after muscle use.  Spasm of your jaw muscles that makes it difficult to open your mouth.  How is this diagnosed? This condition is often easy to diagnose based on the patterns of muscle contractions in your body and how your body responds to treatment. Diagnosis will also include a physical exam and medical history. You may have other tests if the cause of your condition is not known. How is this treated? The treatment of this condition depends on the underlying cause. If this condition is caused by medicine that you take, your health care provider will likely recommend stopping the use of that medicine. Most often, this condition can be treated with medicines that help to relax the muscles and reverse the reaction (anticholinergics). Botulinum toxin may also be injected to stop the involved muscles from contracting. Morning  Sickness Morning sickness is when you feel sick to your stomach (nauseous) during pregnancy. This nauseous feeling may or may not come with vomiting. It often occurs in the morning but can be a problem any time of day. Morning sickness is most common during the first trimester, but it may continue throughout pregnancy. While morning sickness is unpleasant, it is usually harmless unless you develop severe and continual vomiting (hyperemesis gravidarum). This condition requires more intense treatment. What are the causes? The cause of morning sickness is not completely known but seems to be related to normal hormonal changes that occur in pregnancy. What increases the risk? You are at greater risk if you:  Experienced nausea or vomiting before your pregnancy.  Had morning sickness during a previous pregnancy.  Are pregnant with more than one baby, such as twins.  How is this treated? Do not use any medicines (prescription, over-the-counter, or herbal) for morning sickness without first talking to your health care provider. Your health care provider may prescribe or recommend:  Vitamin B6 supplements.  Anti-nausea medicines.  The herbal medicine ginger.  Follow these instructions at home:  Only take over-the-counter or prescription medicines as directed by your health care provider.  Taking multivitamins before getting pregnant can prevent or decrease the severity of morning sickness in most women.  Eat a piece of dry toast or unsalted crackers before getting out of bed in the morning.  Eat five or six small meals a day.  Eat dry and bland foods (rice, baked potato). Foods high in carbohydrates are often helpful.  Do not drink liquids with your meals. Drink liquids between meals.  Avoid greasy, fatty,  and spicy foods.  Get someone to cook for you if the smell of any food causes nausea and vomiting.  If you feel nauseous after taking prenatal vitamins, take the vitamins at night  or with a snack.  Snack on protein foods (nuts, yogurt, cheese) between meals if you are hungry.  Eat unsweetened gelatins for desserts.  Wearing an acupressure wristband (worn for sea sickness) may be helpful.  Acupuncture may be helpful.  Do not smoke.  Get a humidifier to keep the air in your house free of odors.  Get plenty of fresh air. Contact a health care provider if:  Your home remedies are not working, and you need medicine.  You feel dizzy or lightheaded.  You are losing weight. Get help right away if:  You have persistent and uncontrolled nausea and vomiting.  You pass out (faint). This information is not intended to replace advice given to you by your health care provider. Make sure you discuss any questions you have with your health care provider. Document Released: 11/16/2006 Document Revised: 03/02/2016 Document Reviewed: 03/12/2013 Elsevier Interactive Patient Education  2017 [employer].  Follow these instructions at home:  Talk with your health care provider about avoiding the use of the medicine or medicines that are thought to be the cause of the reaction.  Do not drive or operate heavy machinery until your health care provider approves.  Take medicines only as directed by your health care provider. Contact a health care provider if:  Your original symptoms return after treatment. This information is not intended to replace advice given to you by your health care provider. Make sure you discuss any questions you have with your health care provider. Document Released: 09/22/2000 Document Revised: 03/02/2016 Document Reviewed: 09/21/2014 Elsevier Interactive Patient Education  [employer].

## 2018-01-25 NOTE — MAU Provider Note (Signed)
Chief Complaint: Emesis   First Provider Initiated Contact with Patient 01/25/18 0031        SUBJECTIVE HPI: Carmen [name] is a 27 y.o. G1P0 at 8070w6d by LMP who presents to maternity admissions reporting persistent nausea and vomiting.  Was seen here on 01/10/18 and 01/15/18 and treated with Phenergan and then zofran was added.  Did well until today when meds did not seem to work.   . She denies vaginal bleeding, vaginal itching/burning, urinary symptoms, h/a, dizziness, or fever/chills.    Emesis   This is a recurrent problem. The current episode started today. The problem has been unchanged. There has been no fever. Pertinent negatives include no abdominal pain, chills, diarrhea, fever or myalgias. Treatments tried: zofran. The treatment provided no relief.   RN Note: PT SAYS VOMITING STARTED  2 WEEKS AGO.   CAME HERE 4-8- WE GAVE MEDS - ZOFRAN-   STOPPED WORKING . TONIGHT  ZOFRAN AT 4PM.   NO CRAMPING  . [facility] -  APPOINTMENT IS4-25    Past Medical History:  Diagnosis Date  . Panic attacks    History reviewed. No pertinent surgical history. Social History   Socioeconomic History  . Marital status: Single    Spouse name: Not on file  . Number of children: Not on file  . Years of education: Not on file  . Highest education level: Not on file  Occupational History  . Not on file  Social Needs  . Financial resource strain: Not on file  . Food insecurity:    Worry: Not on file    Inability: Not on file  . Transportation needs:    Medical: Not on file    Non-medical: Not on file  Tobacco Use  . Smoking status: Former Smoker    Types: Cigarettes  . Smokeless tobacco: Never Used  Substance and Sexual Activity  . Alcohol use: Yes    Comment: 1 pack every 2 weeks  . Drug use: No  . Sexual activity: Yes  Lifestyle  . Physical activity:    Days per week: Not on file    Minutes per session: Not on file  . Stress: Not on file  Relationships  . Social connections:    Talks  on phone: Not on file    Gets together: Not on file    Attends religious service: Not on file    Active member of club or organization: Not on file    Attends meetings of clubs or organizations: Not on file    Relationship status: Not on file  . Intimate partner violence:    Fear of current or ex partner: Not on file    Emotionally abused: Not on file    Physically abused: Not on file    Forced sexual activity: Not on file  Other Topics Concern  . Not on file  Social History Narrative  . Not on file   No current facility-administered medications on file prior to encounter.    Current Outpatient Medications on File Prior to Encounter  Medication Sig Dispense Refill  . ondansetron (ZOFRAN) 8 MG tablet Take 1 tablet (8 mg total) by mouth every 8 (eight) hours as needed for nausea or vomiting. 60 tablet 1  . Prenatal Vit-Fe Fumarate-FA (PRENATAL COMPLETE) 14-0.4 MG TABS Take 1 tablet by mouth daily. 60 each 0  . Doxylamine-Pyridoxine 10-10 MG TBEC Take 1 tablet by mouth at bedtime as needed (nausea). 100 tablet 5  . famotidine (PEPCID) 20 MG  tablet Take 1 tablet (20 mg total) by mouth 2 (two) times daily. 60 tablet 1   Allergies  Allergen Reactions  . Morphine And Related Anaphylaxis    I have reviewed patient's Past Medical Hx, Surgical Hx, Family Hx, Social Hx, medications and allergies.   ROS:  Review of Systems  Constitutional: Negative for chills and fever.  Gastrointestinal: Positive for vomiting. Negative for abdominal pain and diarrhea.  Musculoskeletal: Negative for myalgias.   Review of Systems  Other systems negative   Physical Exam  Physical Exam Patient Vitals for the past 24 hrs:  BP Temp Temp src Pulse Resp Height Weight  01/24/18 2302 97/69 98.8 F (37.1 C) Oral 95 20 4\' 11"  (1.499 m) 112 lb (50.8 kg)   Constitutional: Well-developed female in no acute distress.  Cardiovascular: normal rate, occasional irregular heartbeat Respiratory: normal effort GI:  Abd soft, non-tender. Pos BS x 4 MS: Extremities nontender, no edema, normal ROM Neurologic: Alert and oriented x 4.  GU: Neg CVAT.  PELVIC EXAM: deferred  LAB RESULTS Results for orders placed or performed during the hospital encounter of 01/24/18 (from the past 24 hour(s))  Urinalysis, Routine w reflex microscopic     Status: Abnormal   Collection Time: 01/24/18 11:04 PM  Result Value Ref Range   Color, Urine YELLOW YELLOW   APPearance HAZY (A) CLEAR   Specific Gravity, Urine 1.023 1.005 - 1.030   pH 6.0 5.0 - 8.0   Glucose, UA NEGATIVE NEGATIVE mg/dL   Hgb urine dipstick NEGATIVE NEGATIVE   Bilirubin Urine NEGATIVE NEGATIVE   Ketones, ur NEGATIVE NEGATIVE mg/dL   Protein, ur NEGATIVE NEGATIVE mg/dL   Nitrite NEGATIVE NEGATIVE   Leukocytes, UA NEGATIVE NEGATIVE       IMAGING none  MAU Management/MDM: Discussed option of PO meds and attempted hydration vs IV fluids.  Urine does not show significant dehydration. Wants IV hydration Phenergan given with subsequent dystonic reaction.  Gross jerking movements when awake Benadryl given 25mg  with consultation from Pharmacist Over time dystonic movements resolved Patient very somnolent, as expected No vomiting while here.  Will discharge home. Discussed dystonic reaction is usually self-limited (had PO Phenergan on prior MAU visit with no reaction)  ASSESSMENT Single IUP at [redacted]w[redacted]d Nausea and vomiting of pregnancy Dystonic reaction to Phenergan, resolved with Benadryl  PLAN Discharge home Advance diet as tolerated Has Zofran at home  Pt stable at time of discharge. Encouraged to return here or to other Urgent Care/ED if she develops worsening of symptoms, increase in pain, fever, or other concerning symptoms.    Wynelle Bourgeois CNM, MSN Certified Nurse-Midwife 01/25/2018  12:31 AM

## 2018-01-31 DIAGNOSIS — G2409 Other drug induced dystonia: Secondary | ICD-10-CM | POA: Diagnosis not present

## 2018-01-31 DIAGNOSIS — Z886 Allergy status to analgesic agent status: Secondary | ICD-10-CM | POA: Diagnosis not present

## 2018-01-31 DIAGNOSIS — Z87891 Personal history of nicotine dependence: Secondary | ICD-10-CM | POA: Diagnosis not present

## 2018-01-31 DIAGNOSIS — F410 Panic disorder [episodic paroxysmal anxiety] without agoraphobia: Secondary | ICD-10-CM | POA: Diagnosis not present

## 2018-01-31 DIAGNOSIS — J45909 Unspecified asthma, uncomplicated: Secondary | ICD-10-CM | POA: Diagnosis not present

## 2018-01-31 DIAGNOSIS — O99320 Drug use complicating pregnancy, unspecified trimester: Secondary | ICD-10-CM | POA: Diagnosis not present

## 2018-01-31 DIAGNOSIS — R823 Hemoglobinuria: Secondary | ICD-10-CM | POA: Diagnosis not present

## 2018-01-31 DIAGNOSIS — Z3401 Encounter for supervision of normal first pregnancy, first trimester: Secondary | ICD-10-CM | POA: Diagnosis not present

## 2018-01-31 LAB — OB RESULTS CONSOLE RUBELLA ANTIBODY, IGM: Rubella: IMMUNE

## 2018-01-31 LAB — OB RESULTS CONSOLE VARICELLA ZOSTER ANTIBODY, IGG: VARICELLA IGG: IMMUNE

## 2018-01-31 LAB — OB RESULTS CONSOLE ABO/RH: RH TYPE: POSITIVE

## 2018-01-31 LAB — OB RESULTS CONSOLE RPR: RPR: NONREACTIVE

## 2018-01-31 LAB — OB RESULTS CONSOLE GC/CHLAMYDIA
CHLAMYDIA, DNA PROBE: NEGATIVE
Gonorrhea: NEGATIVE

## 2018-01-31 LAB — OB RESULTS CONSOLE ANTIBODY SCREEN: Antibody Screen: NEGATIVE

## 2018-01-31 LAB — OB RESULTS CONSOLE PLATELET COUNT: Platelets: 180

## 2018-01-31 LAB — OB RESULTS CONSOLE HGB/HCT, BLOOD
HCT: 37
Hemoglobin: 12.1

## 2018-01-31 LAB — OB RESULTS CONSOLE HEPATITIS B SURFACE ANTIGEN: HEP B S AG: NEGATIVE

## 2018-01-31 LAB — OB RESULTS CONSOLE HIV ANTIBODY (ROUTINE TESTING): HIV: NONREACTIVE

## 2018-02-01 ENCOUNTER — Other Ambulatory Visit (HOSPITAL_COMMUNITY): Payer: Self-pay | Admitting: Nurse Practitioner

## 2018-02-01 DIAGNOSIS — Z369 Encounter for antenatal screening, unspecified: Secondary | ICD-10-CM

## 2018-02-01 DIAGNOSIS — Z3A13 13 weeks gestation of pregnancy: Secondary | ICD-10-CM

## 2018-02-08 DIAGNOSIS — O88219 Thromboembolism in pregnancy, unspecified trimester: Secondary | ICD-10-CM

## 2018-02-08 HISTORY — DX: Thromboembolism in pregnancy, unspecified trimester: O88.219

## 2018-02-13 DIAGNOSIS — I2699 Other pulmonary embolism without acute cor pulmonale: Secondary | ICD-10-CM | POA: Diagnosis not present

## 2018-02-13 DIAGNOSIS — O88811 Other embolism in pregnancy, first trimester: Secondary | ICD-10-CM | POA: Diagnosis not present

## 2018-02-13 DIAGNOSIS — Z3A10 10 weeks gestation of pregnancy: Secondary | ICD-10-CM | POA: Diagnosis not present

## 2018-02-18 ENCOUNTER — Encounter: Payer: Self-pay | Admitting: *Deleted

## 2018-02-19 ENCOUNTER — Encounter: Payer: Managed Care, Other (non HMO) | Admitting: Obstetrics and Gynecology

## 2018-02-22 ENCOUNTER — Encounter: Payer: Self-pay | Admitting: Family Medicine

## 2018-02-28 ENCOUNTER — Encounter (HOSPITAL_COMMUNITY): Payer: Self-pay

## 2018-03-01 ENCOUNTER — Encounter (HOSPITAL_COMMUNITY): Payer: Self-pay

## 2018-03-01 ENCOUNTER — Ambulatory Visit (HOSPITAL_COMMUNITY): Payer: Managed Care, Other (non HMO)

## 2018-03-01 ENCOUNTER — Ambulatory Visit (HOSPITAL_COMMUNITY)
Admission: RE | Admit: 2018-03-01 | Discharge: 2018-03-01 | Disposition: A | Discharge status: Home / Self Care | Payer: Managed Care, Other (non HMO) | Source: Ambulatory Visit | Attending: Nurse Practitioner | Admitting: Nurse Practitioner

## 2018-03-01 HISTORY — DX: Unspecified asthma, uncomplicated: J45.909

## 2018-03-14 ENCOUNTER — Encounter: Payer: Managed Care, Other (non HMO) | Admitting: Obstetrics and Gynecology

## 2018-03-14 ENCOUNTER — Encounter: Payer: Self-pay | Admitting: Obstetrics and Gynecology

## 2018-03-15 NOTE — Progress Notes (Signed)
Patient did not keep transfer OB appointment for 03/14/2018.  Carmen [name], Jr MD Attending [facility](Faculty Practice)

## 2018-03-18 ENCOUNTER — Encounter: Payer: Self-pay | Admitting: Obstetrics and Gynecology

## 2018-03-18 NOTE — Progress Notes (Unsigned)
03/18/2018 [facility] about patient not coming to two scheduled appointments. Corrie DandyMary stated on 02/26/2018 Judeth CornfieldStephanie in medical records  [facility]

## 2018-03-19 ENCOUNTER — Encounter: Payer: Self-pay | Admitting: General Practice

## 2018-04-05 ENCOUNTER — Encounter (HOSPITAL_COMMUNITY): Payer: Self-pay

## 2018-04-05 ENCOUNTER — Inpatient Hospital Stay (HOSPITAL_COMMUNITY)
Admission: AD | Admit: 2018-04-05 | Discharge: 2018-04-05 | Disposition: A | Discharge status: Home / Self Care | Payer: Medicaid Other | Source: Ambulatory Visit | Attending: Obstetrics and Gynecology | Admitting: Obstetrics and Gynecology

## 2018-04-05 DIAGNOSIS — R1032 Left lower quadrant pain: Secondary | ICD-10-CM | POA: Insufficient documentation

## 2018-04-05 DIAGNOSIS — Z3A18 18 weeks gestation of pregnancy: Secondary | ICD-10-CM | POA: Insufficient documentation

## 2018-04-05 DIAGNOSIS — O9989 Other specified diseases and conditions complicating pregnancy, childbirth and the puerperium: Secondary | ICD-10-CM

## 2018-04-05 DIAGNOSIS — Z885 Allergy status to narcotic agent status: Secondary | ICD-10-CM | POA: Diagnosis not present

## 2018-04-05 DIAGNOSIS — Z86711 Personal history of pulmonary embolism: Secondary | ICD-10-CM | POA: Insufficient documentation

## 2018-04-05 DIAGNOSIS — Z87891 Personal history of nicotine dependence: Secondary | ICD-10-CM | POA: Insufficient documentation

## 2018-04-05 DIAGNOSIS — O99512 Diseases of the respiratory system complicating pregnancy, second trimester: Secondary | ICD-10-CM | POA: Diagnosis not present

## 2018-04-05 DIAGNOSIS — Z8249 Family history of ischemic heart disease and other diseases of the circulatory system: Secondary | ICD-10-CM | POA: Insufficient documentation

## 2018-04-05 DIAGNOSIS — N949 Unspecified condition associated with female genital organs and menstrual cycle: Secondary | ICD-10-CM

## 2018-04-05 DIAGNOSIS — J45909 Unspecified asthma, uncomplicated: Secondary | ICD-10-CM | POA: Insufficient documentation

## 2018-04-05 DIAGNOSIS — O26892 Other specified pregnancy related conditions, second trimester: Secondary | ICD-10-CM | POA: Insufficient documentation

## 2018-04-05 HISTORY — DX: Other pulmonary embolism without acute cor pulmonale: I26.99

## 2018-04-05 LAB — URINALYSIS, ROUTINE W REFLEX MICROSCOPIC
BILIRUBIN URINE: NEGATIVE
Glucose, UA: NEGATIVE mg/dL
HGB URINE DIPSTICK: NEGATIVE
KETONES UR: NEGATIVE mg/dL
Leukocytes, UA: NEGATIVE
NITRITE: NEGATIVE
Protein, ur: NEGATIVE mg/dL
SPECIFIC GRAVITY, URINE: 1.02 (ref 1.005–1.030)
pH: 7 (ref 5.0–8.0)

## 2018-04-05 LAB — WET PREP, GENITAL
Clue Cells Wet Prep HPF POC: NONE SEEN
SPERM: NONE SEEN
TRICH WET PREP: NONE SEEN
Yeast Wet Prep HPF POC: NONE SEEN

## 2018-04-05 NOTE — MAU Provider Note (Signed)
History     CSN: [id number]  Arrival date and time: 04/05/18 1212   First Provider Initiated Contact with Patient 04/05/18 1237      Chief Complaint  Patient presents with  . Abdominal Pain   HPI [name] is a 27 y.o. G1P0 at [redacted]w[redacted]d who presents to MAU today with complaint of LLQ abdominal pain for the last few days. She states that pain is mostly with movement and change of positions. She denies vaginal bleeding, LOF today. She has not taken anything for pain. She had a PE earlier in the pregnancy and denies complications of that today. She has received care at [facility] throughout this pregnancy and is the process of transferring to [facility] for prenatal care, she has an appointment on 7/3.   OB History    Gravida  1   Para      Term      Preterm      AB      Living        SAB      TAB      Ectopic      Multiple      Live Births              Past Medical History:  Diagnosis Date  . Asthma   . Panic attacks   . Pulmonary embolism (HCC)     History reviewed. No pertinent surgical history.  Family History  Problem Relation Age of Onset  . Pulmonary embolism Mother     Social History   Tobacco Use  . Smoking status: Former Smoker    Types: Cigarettes  . Smokeless tobacco: Never Used  Substance Use Topics  . Alcohol use: Not Currently  . Drug use: No    Allergies:  Allergies  Allergen Reactions  . Morphine And Related Anaphylaxis    No medications prior to admission.    Review of Systems  Constitutional: Negative for fever.  Gastrointestinal: Positive for abdominal pain. Negative for constipation, diarrhea, nausea and vomiting.  Genitourinary: Negative for dysuria, frequency, urgency, vaginal bleeding and vaginal discharge.   Physical Exam   Blood pressure 107/60, pulse (!) 103, temperature 98.4 F (36.9 C), temperature source Oral, resp. rate 18, height 4\' 11"  (1.499 m), weight 127 lb (57.6 kg), last menstrual period  11/27/2017, SpO2 99 %.  Physical Exam  Nursing note and vitals reviewed. Constitutional: She is oriented to person, place, and time. She appears well-developed and well-nourished. No distress.  HENT:  Head: Normocephalic and atraumatic.  Cardiovascular: Normal rate.  Respiratory: Effort normal.  GI: Soft. She exhibits no distension and no mass. There is no tenderness. There is no rebound and no guarding.  Neurological: She is alert and oriented to person, place, and time.  Skin: Skin is warm and dry. No erythema.  Psychiatric: She has a normal mood and affect.  Dilation: Closed Effacement (%): Thick Cervical Position: Posterior Exam by:: j Manroop Jakubowicz pa   Results for orders placed or performed during the hospital encounter of 04/05/18 (from the past 24 hour(s))  Urinalysis, Routine w reflex microscopic     Status: None   Collection Time: 04/05/18 12:24 PM  Result Value Ref Range   Color, Urine YELLOW YELLOW   APPearance CLEAR CLEAR   Specific Gravity, Urine 1.020 1.005 - 1.030   pH 7.0 5.0 - 8.0   Glucose, UA NEGATIVE NEGATIVE mg/dL   Hgb urine dipstick NEGATIVE NEGATIVE   Bilirubin Urine NEGATIVE NEGATIVE   Ketones,  ur NEGATIVE NEGATIVE mg/dL   Protein, ur NEGATIVE NEGATIVE mg/dL   Nitrite NEGATIVE NEGATIVE   Leukocytes, UA NEGATIVE NEGATIVE  Wet prep, genital     Status: Abnormal   Collection Time: 04/05/18 12:42 PM  Result Value Ref Range   Yeast Wet Prep HPF POC NONE SEEN NONE SEEN   Trich, Wet Prep NONE SEEN NONE SEEN   Clue Cells Wet Prep HPF POC NONE SEEN NONE SEEN   WBC, Wet Prep HPF POC MODERATE (A) NONE SEEN   Sperm NONE SEEN     MAU Course  Procedures None  MDM FHR - 160 bpm with doppler  UA and wet prep today without gross abnormality  Assessment and Plan  A: SIUP at 3540w3d Round ligament pain   P:  Discharge home Tylenol PRN for pain Discussed use of abdominal binder and hydrotherapy for management of symptoms Second trimester precautions  discussed Patient advised to follow-up with [facility] as planned to transfer prenatal care on 04/10/18 Patient may return to MAU as needed or if her condition were to change or worsen  Vonzella NippleJulie Maleni Seyer, PA-C 04/05/2018, 1:47 PM

## 2018-04-05 NOTE — Discharge Instructions (Signed)

## 2018-04-05 NOTE — MAU Note (Signed)
In may was admitted to [facility] for [facility]  Has been getting care in [place] area and is planning to transfer care to [place]  Cramps and sharp abdominal pains for the past 2 days, no fetal movement, no bleeding, no vaginal discharge

## 2018-04-10 ENCOUNTER — Ambulatory Visit (INDEPENDENT_AMBULATORY_CARE_PROVIDER_SITE_OTHER): Payer: Medicaid Other | Admitting: Obstetrics & Gynecology

## 2018-04-10 VITALS — BP 114/73 | HR 110 | Wt 131.0 lb

## 2018-04-10 DIAGNOSIS — O88211 Thromboembolism in pregnancy, first trimester: Secondary | ICD-10-CM

## 2018-04-10 DIAGNOSIS — Z3689 Encounter for other specified antenatal screening: Secondary | ICD-10-CM

## 2018-04-10 DIAGNOSIS — O0990 Supervision of high risk pregnancy, unspecified, unspecified trimester: Secondary | ICD-10-CM | POA: Insufficient documentation

## 2018-04-10 LAB — POCT URINALYSIS DIP (DEVICE)
Bilirubin Urine: NEGATIVE
GLUCOSE, UA: NEGATIVE mg/dL
Leukocytes, UA: NEGATIVE
Nitrite: NEGATIVE
PH: 6 (ref 5.0–8.0)
PROTEIN: NEGATIVE mg/dL
SPECIFIC GRAVITY, URINE: 1.025 (ref 1.005–1.030)
UROBILINOGEN UA: 1 mg/dL (ref 0.0–1.0)

## 2018-04-10 NOTE — Progress Notes (Signed)
   PRENATAL VISIT NOTE  Subjective:  Carmen [name] is a 27 y.o. G1P0 at 6837w1d being seen today for transfer of prenatal care from [facility].  She just moved from the area.  She is currently monitored for the following issues for this high-risk pregnancy and has Pulmonary embolism affecting pregnancy in first trimester and Supervision of high-risk pregnancy on their problem list.  Patient reports no complaints.  Contractions: Not present. Vag. Bleeding: None.  Movement: Present. Denies leaking of fluid.   The following portions of the patient's history were reviewed and updated as appropriate: allergies, current medications, past family history, past medical history, past social history, past surgical history and problem list. Problem list updated.  Objective:   Vitals:   04/10/18 1535  BP: 114/73  Pulse: (!) 110  Weight: 131 lb (59.4 kg)    Fetal Status: Fetal Heart Rate (bpm): 168   Movement: Present     General:  Alert, oriented and cooperative. Patient is in no acute distress.  Skin: Skin is warm and dry. No rash noted.   Cardiovascular: Normal heart rate noted  Respiratory: Normal respiratory effort, no problems with respiration noted  Abdomen: Soft, gravid, appropriate for gestational age.  Pain/Pressure: Absent     Pelvic: Cervical exam deferred        Extremities: Normal range of motion.  Edema: None  Mental Status: Normal mood and affect. Normal behavior. Normal judgment and thought content.   Assessment and Plan:  Pregnancy: G1P0 at 6637w1d  1. Pulmonary embolism affecting pregnancy in first trimester On therapeutic Lovenox, will continue. Check anti-Xa level next visit - [facility] BABYSCRIPTS OPT IN - enoxaparin (LOVENOX) 60 MG/0.6ML injection; Inject into the skin. - US MFM OB DETAIL +14 WK; Future  2. Encounter for fetal anatomic survey Anatomy scan ordered. - US MFM OB DETAIL +14 WK; Future  3. Supervision of high risk pregnancy, antepartum - [facility] BABYSCRIPTS  OPT IN - docusate sodium (COLACE) 100 MG capsule; Take by mouth. - ondansetron (ZOFRAN-ODT) 8 MG disintegrating tablet; Take by mouth. - Prenatal Vit-Fe Phos-FA-Omega (VITAFOL GUMMIES) 3.33-0.333-34.8 MG CHEW; CHEW 3 GUMMIES BY MOUTH EVERY DAY - US MFM OB DETAIL +14 WK; Future  Preterm labor symptoms and general obstetric precautions including but not limited to vaginal bleeding, contractions, leaking of fluid and fetal movement were reviewed in detail with the patient. Please refer to After Visit Summary for other counseling recommendations.  Return in about 1 month (around 05/08/2018) for OB Visit (HOB).  Future Appointments  Date Time Provider Department Center  04/25/2018 11:15 AM [facility] US 4 [facility]    Jaynie CollinsUgonna Laniyah Rosenwald, MD

## 2018-04-10 NOTE — Patient Instructions (Signed)
Return to clinic for any scheduled appointments or obstetric concerns, or go to [facility]U for evaluation   Second Trimester of Pregnancy The second trimester is from week 14 through week 27 (months 4 through 6). The second trimester is often a time when you feel your best. Your body has adjusted to being pregnant, and you begin to feel better physically. Usually, morning sickness has lessened or quit completely, you may have more energy, and you may have an increase in appetite. The second trimester is also a time when the fetus is growing rapidly. At the end of the sixth month, the fetus is about 9 inches long and weighs about 1 pounds. You will likely begin to feel the baby move (quickening) between 16 and 20 weeks of pregnancy. Body changes during your second trimester Your body continues to go through many changes during your second trimester. The changes vary from woman to woman.  Your weight will continue to increase. You will notice your lower abdomen bulging out.  You may begin to get stretch marks on your hips, abdomen, and breasts.  You may develop headaches that can be relieved by medicines. The medicines should be approved by your health care provider.  You may urinate more often because the fetus is pressing on your bladder.  You may develop or continue to have heartburn as a result of your pregnancy.  You may develop constipation because certain hormones are causing the muscles that push waste through your intestines to slow down.  You may develop hemorrhoids or swollen, bulging veins (varicose veins).  You may have back pain. This is caused by: ? Weight gain. ? Pregnancy hormones that are relaxing the joints in your pelvis. ? A shift in weight and the muscles that support your balance.  Your breasts will continue to grow and they will continue to become tender.  Your gums may bleed and may be sensitive to brushing and flossing.  Dark spots or blotches (chloasma, mask of  pregnancy) may develop on your face. This will likely fade after the baby is born.  A dark line from your belly button to the pubic area (linea nigra) may appear. This will likely fade after the baby is born.  You may have changes in your hair. These can include thickening of your hair, rapid growth, and changes in texture. Some women also have hair loss during or after pregnancy, or hair that feels dry or thin. Your hair will most likely return to normal after your baby is born.  What to expect at prenatal visits During a routine prenatal visit:  You will be weighed to make sure you and the fetus are growing normally.  Your blood pressure will be taken.  Your abdomen will be measured to track your baby's growth.  The fetal heartbeat will be listened to.  Any test results from the previous visit will be discussed.  Your health care provider may ask you:  How you are feeling.  If you are feeling the baby move.  If you have had any abnormal symptoms, such as leaking fluid, bleeding, severe headaches, or abdominal cramping.  If you are using any tobacco products, including cigarettes, chewing tobacco, and electronic cigarettes.  If you have any questions.  Other tests that may be performed during your second trimester include:  Blood tests that check for: ? Low iron levels (anemia). ? High blood sugar that affects pregnant women (gestational diabetes) between 24 and 28 weeks. ? Rh antibodies. This is to check   for a protein on red blood cells (Rh factor).  Urine tests to check for infections, diabetes, or protein in the urine.  An ultrasound to confirm the proper growth and development of the baby.  An amniocentesis to check for possible genetic problems.  Fetal screens for spina bifida and Down syndrome.  HIV (human immunodeficiency virus) testing. Routine prenatal testing includes screening for HIV, unless you choose not to have this test.  Follow these instructions at  home: Medicines  Follow your health care provider's instructions regarding medicine use. Specific medicines may be either safe or unsafe to take during pregnancy.  Take a prenatal vitamin that contains at least 600 micrograms (mcg) of folic acid.  If you develop constipation, try taking a stool softener if your health care provider approves. Eating and drinking  Eat a balanced diet that includes fresh fruits and vegetables, whole grains, good sources of protein such as meat, eggs, or tofu, and low-fat dairy. Your health care provider will help you determine the amount of weight gain that is right for you.  Avoid raw meat and uncooked cheese. These carry germs that can cause birth defects in the baby.  If you have low calcium intake from food, talk to your health care provider about whether you should take a daily calcium supplement.  Limit foods that are high in fat and processed sugars, such as fried and sweet foods.  To prevent constipation: ? Drink enough fluid to keep your urine clear or pale yellow. ? Eat foods that are high in fiber, such as fresh fruits and vegetables, whole grains, and beans. Activity  Exercise only as directed by your health care provider. Most women can continue their usual exercise routine during pregnancy. Try to exercise for 30 minutes at least 5 days a week. Stop exercising if you experience uterine contractions.  Avoid heavy lifting, wear low heel shoes, and practice good posture.  A sexual relationship may be continued unless your health care provider directs you otherwise. Relieving pain and discomfort  Wear a good support bra to prevent discomfort from breast tenderness.  Take warm sitz baths to soothe any pain or discomfort caused by hemorrhoids. Use hemorrhoid cream if your health care provider approves.  Rest with your legs elevated if you have leg cramps or low back pain.  If you develop varicose veins, wear support hose. Elevate your feet  for 15 minutes, 3-4 times a day. Limit salt in your diet. Prenatal Care  Write down your questions. Take them to your prenatal visits.  Keep all your prenatal visits as told by your health care provider. This is important. Safety  Wear your seat belt at all times when driving.  Make a list of emergency phone numbers, including numbers for family, friends, the hospital, and police and fire departments. General instructions  Ask your health care provider for a referral to a local prenatal education class. Begin classes no later than the beginning of month 6 of your pregnancy.  Ask for help if you have counseling or nutritional needs during pregnancy. Your health care provider can offer advice or refer you to specialists for help with various needs.  Do not use hot tubs, steam rooms, or saunas.  Do not douche or use tampons or scented sanitary pads.  Do not cross your legs for long periods of time.  Avoid cat litter boxes and soil used by cats. These carry germs that can cause birth defects in the baby and possibly loss of the fetus   by miscarriage or stillbirth.  Avoid all smoking, herbs, alcohol, and unprescribed drugs. Chemicals in these products can affect the formation and growth of the baby.  Do not use any products that contain nicotine or tobacco, such as cigarettes and e-cigarettes. If you need help quitting, ask your health care provider.  Visit your dentist if you have not gone yet during your pregnancy. Use a soft toothbrush to brush your teeth and be gentle when you floss. Contact a health care provider if:  You have dizziness.  You have mild pelvic cramps, pelvic pressure, or nagging pain in the abdominal area.  You have persistent nausea, vomiting, or diarrhea.  You have a bad smelling vaginal discharge.  You have pain when you urinate. Get help right away if:  You have a fever.  You are leaking fluid from your vagina.  You have spotting or bleeding from your  vagina.  You have severe abdominal cramping or pain.  You have rapid weight gain or weight loss.  You have shortness of breath with chest pain.  You notice sudden or extreme swelling of your face, hands, ankles, feet, or legs.  You have not felt your baby move in over an hour.  You have severe headaches that do not go away when you take medicine.  You have vision changes. Summary  The second trimester is from week 14 through week 27 (months 4 through 6). It is also a time when the fetus is growing rapidly.  Your body goes through many changes during pregnancy. The changes vary from woman to woman.  Avoid all smoking, herbs, alcohol, and unprescribed drugs. These chemicals affect the formation and growth your baby.  Do not use any tobacco products, such as cigarettes, chewing tobacco, and e-cigarettes. If you need help quitting, ask your health care provider.  Contact your health care provider if you have any questions. Keep all prenatal visits as told by your health care provider. This is important. This information is not intended to replace advice given to you by your health care provider. Make sure you discuss any questions you have with your health care provider. Document Released: 09/19/2001 Document Revised: 10/31/2016 Document Reviewed: 10/31/2016 Elsevier Interactive Patient Education  2018 [employer]  

## 2018-04-18 ENCOUNTER — Encounter (HOSPITAL_COMMUNITY): Payer: Self-pay

## 2018-04-25 ENCOUNTER — Other Ambulatory Visit (HOSPITAL_COMMUNITY): Payer: Self-pay | Admitting: *Deleted

## 2018-04-25 ENCOUNTER — Ambulatory Visit (HOSPITAL_COMMUNITY)
Admission: RE | Admit: 2018-04-25 | Discharge: 2018-04-25 | Disposition: A | Discharge status: Home / Self Care | Payer: Medicaid Other | Source: Ambulatory Visit | Attending: Obstetrics & Gynecology | Admitting: Obstetrics & Gynecology

## 2018-04-25 DIAGNOSIS — Z362 Encounter for other antenatal screening follow-up: Secondary | ICD-10-CM

## 2018-04-25 DIAGNOSIS — O99512 Diseases of the respiratory system complicating pregnancy, second trimester: Secondary | ICD-10-CM | POA: Insufficient documentation

## 2018-04-25 DIAGNOSIS — O88212 Thromboembolism in pregnancy, second trimester: Secondary | ICD-10-CM | POA: Diagnosis not present

## 2018-04-25 DIAGNOSIS — O0992 Supervision of high risk pregnancy, unspecified, second trimester: Secondary | ICD-10-CM | POA: Diagnosis not present

## 2018-04-25 DIAGNOSIS — O88211 Thromboembolism in pregnancy, first trimester: Secondary | ICD-10-CM | POA: Diagnosis present

## 2018-04-25 DIAGNOSIS — Z3A21 21 weeks gestation of pregnancy: Secondary | ICD-10-CM | POA: Insufficient documentation

## 2018-04-25 DIAGNOSIS — Z363 Encounter for antenatal screening for malformations: Secondary | ICD-10-CM | POA: Insufficient documentation

## 2018-04-25 DIAGNOSIS — J45909 Unspecified asthma, uncomplicated: Secondary | ICD-10-CM | POA: Diagnosis not present

## 2018-04-25 DIAGNOSIS — Z3689 Encounter for other specified antenatal screening: Secondary | ICD-10-CM

## 2018-04-25 DIAGNOSIS — O0990 Supervision of high risk pregnancy, unspecified, unspecified trimester: Secondary | ICD-10-CM

## 2018-04-27 ENCOUNTER — Inpatient Hospital Stay (HOSPITAL_COMMUNITY)
Admission: AD | Admit: 2018-04-27 | Discharge: 2018-04-27 | Disposition: A | Discharge status: Home / Self Care | Payer: Medicaid Other | Source: Ambulatory Visit | Attending: Obstetrics and Gynecology | Admitting: Obstetrics and Gynecology

## 2018-04-27 ENCOUNTER — Encounter (HOSPITAL_COMMUNITY): Payer: Self-pay | Admitting: *Deleted

## 2018-04-27 ENCOUNTER — Other Ambulatory Visit: Payer: Self-pay

## 2018-04-27 DIAGNOSIS — O0990 Supervision of high risk pregnancy, unspecified, unspecified trimester: Secondary | ICD-10-CM

## 2018-04-27 DIAGNOSIS — Z86711 Personal history of pulmonary embolism: Secondary | ICD-10-CM | POA: Diagnosis not present

## 2018-04-27 DIAGNOSIS — R102 Pelvic and perineal pain: Secondary | ICD-10-CM | POA: Insufficient documentation

## 2018-04-27 DIAGNOSIS — Z885 Allergy status to narcotic agent status: Secondary | ICD-10-CM | POA: Diagnosis not present

## 2018-04-27 DIAGNOSIS — Z3A21 21 weeks gestation of pregnancy: Secondary | ICD-10-CM | POA: Insufficient documentation

## 2018-04-27 DIAGNOSIS — Z87891 Personal history of nicotine dependence: Secondary | ICD-10-CM | POA: Diagnosis not present

## 2018-04-27 DIAGNOSIS — N949 Unspecified condition associated with female genital organs and menstrual cycle: Secondary | ICD-10-CM

## 2018-04-27 DIAGNOSIS — O26892 Other specified pregnancy related conditions, second trimester: Secondary | ICD-10-CM

## 2018-04-27 LAB — URINALYSIS, ROUTINE W REFLEX MICROSCOPIC
Bilirubin Urine: NEGATIVE
Glucose, UA: NEGATIVE mg/dL
Hgb urine dipstick: NEGATIVE
Ketones, ur: NEGATIVE mg/dL
LEUKOCYTES UA: NEGATIVE
NITRITE: NEGATIVE
Protein, ur: NEGATIVE mg/dL
Specific Gravity, Urine: 1.019 (ref 1.005–1.030)
pH: 6 (ref 5.0–8.0)

## 2018-04-27 NOTE — [facility] Note (Signed)
Pt reports abdominal pressure worse with movement for a couple of days

## 2018-04-27 NOTE — Discharge Instructions (Signed)
Back Exercises If you have pain in your back, do these exercises 2-3 times each day or as told by your doctor. When the pain goes away, do the exercises once each day, but repeat the steps more times for each exercise (do more repetitions). If you do not have pain in your back, do these exercises once each day or as told by your doctor. Exercises Single Knee to Chest  Do these steps 3-5 times in a row for each leg: 1. Lie on your back on a firm bed or the floor with your legs stretched out. 2. Bring one knee to your chest. 3. Hold your knee to your chest by grabbing your knee or thigh. 4. Pull on your knee until you feel a gentle stretch in your lower back. 5. Keep doing the stretch for 10-30 seconds. 6. Slowly let go of your leg and straighten it.  Pelvic Tilt  Do these steps 5-10 times in a row: 1. Lie on your back on a firm bed or the floor with your legs stretched out. 2. Bend your knees so they point up to the ceiling. Your feet should be flat on the floor. 3. Tighten your lower belly (abdomen) muscles to press your lower back against the floor. This will make your tailbone point up to the ceiling instead of pointing down to your feet or the floor. 4. Stay in this position for 5-10 seconds while you gently tighten your muscles and breathe evenly.  Cat-Cow  Do these steps until your lower back bends more easily: 1. Get on your hands and knees on a firm surface. Keep your hands under your shoulders, and keep your knees under your hips. You may put padding under your knees. 2. Let your head hang down, and make your tailbone point down to the floor so your lower back is round like the back of a cat. 3. Stay in this position for 5 seconds. 4. Slowly lift your head and make your tailbone point up to the ceiling so your back hangs low (sags) like the back of a cow. 5. Stay in this position for 5 seconds.  Press-Ups  Do these steps 5-10 times in a row: 1. Lie on your belly (face-down)  on the floor. 2. Place your hands near your head, about shoulder-width apart. 3. While you keep your back relaxed and keep your hips on the floor, slowly straighten your arms to raise the top half of your body and lift your shoulders. Do not use your back muscles. To make yourself more comfortable, you may change where you place your hands. 4. Stay in this position for 5 seconds. 5. Slowly return to lying flat on the floor.  Bridges  Do these steps 10 times in a row: 1. Lie on your back on a firm surface. 2. Bend your knees so they point up to the ceiling. Your feet should be flat on the floor. 3. Tighten your butt muscles and lift your butt off of the floor until your waist is almost as high as your knees. If you do not feel the muscles working in your butt and the back of your thighs, slide your feet 1-2 inches farther away from your butt. 4. Stay in this position for 3-5 seconds. 5. Slowly lower your butt to the floor, and let your butt muscles relax.  If this exercise is too easy, try doing it with your arms crossed over your chest. Belly Crunches  Do these steps 5-10 times in  a row: 1. Lie on your back on a firm bed or the floor with your legs stretched out. 2. Bend your knees so they point up to the ceiling. Your feet should be flat on the floor. 3. Cross your arms over your chest. 4. Tip your chin a little bit toward your chest but do not bend your neck. 5. Tighten your belly muscles and slowly raise your chest just enough to lift your shoulder blades a tiny bit off of the floor. 6. Slowly lower your chest and your head to the floor.  Back Lifts Do these steps 5-10 times in a row: 1. Lie on your belly (face-down) with your arms at your sides, and rest your forehead on the floor. 2. Tighten the muscles in your legs and your butt. 3. Slowly lift your chest off of the floor while you keep your hips on the floor. Keep the back of your head in line with the curve in your back. Look at  the floor while you do this. 4. Stay in this position for 3-5 seconds. 5. Slowly lower your chest and your face to the floor.  Contact a doctor if:  Your back pain gets a lot worse when you do an exercise.  Your back pain does not lessen 2 hours after you exercise. If you have any of these problems, stop doing the exercises. Do not do them again unless your doctor says it is okay. Get help right away if:  You have sudden, very bad back pain. If this happens, stop doing the exercises. Do not do them again unless your doctor says it is okay. This information is not intended to replace advice given to you by your health care provider. Make sure you discuss any questions you have with your health care provider. Document Released: 10/28/2010 Document Revised: 03/02/2016 Document Reviewed: 11/19/2014 Elsevier Interactive Patient Education  2018 [employer] Back Pain in Pregnancy Back pain during pregnancy is common. Back pain may be caused by several factors that are related to changes during your pregnancy. Follow these instructions at home: Managing pain, stiffness, and swelling  If directed, apply ice for sudden (acute) back pain. ? Put ice in a plastic bag. ? Place a towel between your skin and the bag. ? Leave the ice on for 20 minutes, 2-3 times per day.  If directed, apply heat to the affected area before you exercise: ? Place a towel between your skin and the heat pack or heating pad. ? Leave the heat on for 20-30 minutes. ? Remove the heat if your skin turns bright red. This is especially important if you are unable to feel pain, heat, or cold. You may have a greater risk of getting burned. Activity  Exercise as told by your health care provider. Exercising is the best way to prevent or manage back pain.  Listen to your body when lifting. If lifting hurts, ask for help or bend your knees. This uses your leg muscles instead of your back muscles.  Squat down when picking up  something from the floor. Do not bend over.  Only use bed rest as told by your health care provider. Bed rest should only be used for the most severe episodes of back pain. Standing, Sitting, and Lying Down  Do not stand in one place for long periods of time.  Use good posture when sitting. Make sure your head rests over your shoulders and is not hanging forward. Use a pillow on your lower back  if necessary.  Try sleeping on your side, preferably the left side, with a pillow or two between your legs. If you are sore after a night's rest, your bed may be too soft. A firm mattress may provide more support for your back during pregnancy. General instructions  Do not wear high heels.  Eat a healthy diet. Try to gain weight within your health care provider's recommendations.  Use a maternity girdle, elastic sling, or back brace as told by your health care provider.  Take over-the-counter and prescription medicines only as told by your health care provider.  Keep all follow-up visits as told by your health care provider. This is important. This includes any visits with any specialists, such as a physical therapist. Contact a health care provider if:  Your back pain interferes with your daily activities.  You have increasing pain in other parts of your body. Get help right away if:  You develop numbness, tingling, weakness, or problems with the use of your arms or legs.  You develop severe back pain that is not controlled with medicine.  You have a sudden change in bowel or bladder control.  You develop shortness of breath, dizziness, or you faint.  You develop nausea, vomiting, or sweating.  You have back pain that is a rhythmic, cramping pain similar to labor pains. Labor pain is usually 1-2 minutes apart, lasts for about 1 minute, and involves a bearing down feeling or pressure in your pelvis.  You have back pain and your water breaks or you have vaginal bleeding.  You have back  pain or numbness that travels down your leg.  Your back pain developed after you fell.  You develop pain on one side of your back.  You see blood in your urine.  You develop skin blisters in the area of your back pain. This information is not intended to replace advice given to you by your health care provider. Make sure you discuss any questions you have with your health care provider. Document Released: 01/03/2006 Document Revised: 03/02/2016 Document Reviewed: 06/09/2015 Elsevier Interactive Patient Education  2018 [employer] Abdominal Pain During Pregnancy Belly (abdominal) pain is common during pregnancy. Most of the time, it is not a serious problem. Other times, it can be a sign that something is wrong with the pregnancy. Always tell your doctor if you have belly pain. Follow these instructions at home: Monitor your belly pain for any changes. The following actions may help you feel better:  Do not have sex (intercourse) or put anything in your vagina until you feel better.  Rest until your pain stops.  Drink clear fluids if you feel sick to your stomach (nauseous). Do not eat solid food until you feel better.  Only take medicine as told by your doctor.  Keep all doctor visits as told.  Get help right away if:  You are bleeding, leaking fluid, or pieces of tissue come out of your vagina.  You have more pain or cramping.  You keep throwing up (vomiting).  You have pain when you pee (urinate) or have blood in your pee.  You have a fever.  You do not feel your baby moving as much.  You feel very weak or feel like passing out.  You have trouble breathing, with or without belly pain.  You have a very bad headache and belly pain.  You have fluid leaking from your vagina and belly pain.  You keep having watery poop (diarrhea).  Your belly pain  does not go away after resting, or the pain gets worse. This information is not intended to replace advice given to you  by your health care provider. Make sure you discuss any questions you have with your health care provider. Document Released: 09/13/2009 Document Revised: 05/03/2016 Document Reviewed: 04/24/2013 Elsevier Interactive Patient Education  [employer].

## 2018-04-27 NOTE — MAU Provider Note (Signed)
History   27 year old G1 at 21 weeks and 4 days in with pelvic pressure that started this afternoon.  She denies any urinary  frequency or pain.  She denies vaginal bleeding or rupture membranes.   CSN: [id number]  Arrival date & time 04/27/18  2008   None     No chief complaint on file.   HPI  Past Medical History:  Diagnosis Date  . Asthma   . Panic attacks   . Pulmonary embolism (HCC)     No past surgical history on file.  Family History  Problem Relation Age of Onset  . Pulmonary embolism Mother     Social History   Tobacco Use  . Smoking status: Former Smoker    Types: Cigarettes  . Smokeless tobacco: Never Used  Substance Use Topics  . Alcohol use: Not Currently  . Drug use: No    OB History    Gravida  1   Para      Term      Preterm      AB      Living        SAB      TAB      Ectopic      Multiple      Live Births              Review of Systems  Constitutional: Negative.   HENT: Negative.   Eyes: Negative.   Respiratory: Negative.   Cardiovascular: Positive for chest pain.  Gastrointestinal:       Pelvic pressure  Endocrine: Negative.   Genitourinary: Negative.   Musculoskeletal: Negative.   Skin: Negative.   Allergic/Immunologic: Negative.   Neurological: Negative.   Hematological: Negative.   Psychiatric/Behavioral: Negative.     Allergies  Morphine and related  Home Medications    LMP 11/27/2017 (Exact Date) Comment: pt shielded  Physical Exam  Constitutional: She is oriented to person, place, and time. She appears well-developed and well-nourished.  HENT:  Head: Normocephalic.  Neck: Normal range of motion.  Cardiovascular: Normal rate, regular rhythm, normal heart sounds and intact distal pulses.  Pulmonary/Chest: Effort normal and breath sounds normal.  Abdominal: Soft. Bowel sounds are normal.  Genitourinary: Vagina normal and uterus normal.  Musculoskeletal: Normal range of motion.  Neurological:  She is alert and oriented to person, place, and time.  Psychiatric: She has a normal mood and affect. Her behavior is normal. Judgment and thought content normal.    MAU Course  Procedures (including critical care time)  Labs Reviewed  URINALYSIS, ROUTINE W REFLEX MICROSCOPIC   No results found.   1. Pelvic pressure in pregnancy, antepartum, second trimester       MDM  Vital signs are stable.  Fetal heart tone is strong and regular with a Doppler.  Sterile vaginal exam was performed cervix is firm closed posterior thick and high. U/A WNL will d/c home.

## 2018-05-10 ENCOUNTER — Encounter: Payer: Self-pay | Admitting: Obstetrics and Gynecology

## 2018-05-10 ENCOUNTER — Ambulatory Visit (INDEPENDENT_AMBULATORY_CARE_PROVIDER_SITE_OTHER): Payer: Medicaid Other | Admitting: Obstetrics and Gynecology

## 2018-05-10 VITALS — BP 101/66 | HR 90 | Wt 137.0 lb

## 2018-05-10 DIAGNOSIS — O88211 Thromboembolism in pregnancy, first trimester: Secondary | ICD-10-CM | POA: Diagnosis not present

## 2018-05-10 DIAGNOSIS — O0992 Supervision of high risk pregnancy, unspecified, second trimester: Secondary | ICD-10-CM

## 2018-05-10 NOTE — Progress Notes (Signed)
   PRENATAL VISIT NOTE  Subjective:  Carmen [name] is a 27 y.o. G1P0 at 5420w3d being seen today for ongoing prenatal care.  She is currently monitored for the following issues for this high-risk pregnancy and has Pulmonary embolism affecting pregnancy in first trimester and Supervision of high-risk pregnancy on their problem list.  Patient reports no complaints.  Contractions: Not present. Vag. Bleeding: None.  Movement: Present. Denies leaking of fluid.   The following portions of the patient's history were reviewed and updated as appropriate: allergies, current medications, past family history, past medical history, past social history, past surgical history and problem list. Problem list updated.  Objective:   Vitals:   05/10/18 0927  BP: 101/66  Pulse: 90  Weight: 137 lb (62.1 kg)    Fetal Status: Fetal Heart Rate (bpm): 148 Fundal Height: 23 cm Movement: Present     General:  Alert, oriented and cooperative. Patient is in no acute distress.  Skin: Skin is warm and dry. No rash noted.   Cardiovascular: Normal heart rate noted  Respiratory: Normal respiratory effort, no problems with respiration noted  Abdomen: Soft, gravid, appropriate for gestational age.  Pain/Pressure: Present     Pelvic: Cervical exam deferred        Extremities: Normal range of motion.  Edema: Trace  Mental Status: Normal mood and affect. Normal behavior. Normal judgment and thought content.   Assessment and Plan:  Pregnancy: G1P0 at 7320w3d  1. Supervision of high risk pregnancy in second trimester Patient is doing well without complaints Third trimester labs next visit Anti-x a levels today  2. Pulmonary embolism affecting pregnancy in first trimester Continue daily lovenox  Preterm labor symptoms and general obstetric precautions including but not limited to vaginal bleeding, contractions, leaking of fluid and fetal movement were reviewed in detail with the patient. Please refer to After Visit  Summary for other counseling recommendations.  Return in about 1 month (around 06/07/2018) for ROB, 2 hr glucola next visit.  Future Appointments  Date Time Provider Department Center  05/23/2018 11:15 AM [facility] US 4 [facility]    Catalina AntiguaPeggy Luella Gardenhire, MD

## 2018-05-11 ENCOUNTER — Inpatient Hospital Stay (HOSPITAL_COMMUNITY)
Admission: AD | Admit: 2018-05-11 | Discharge: 2018-05-11 | Disposition: A | Discharge status: Home / Self Care | Payer: Medicaid Other | Source: Ambulatory Visit | Attending: Obstetrics & Gynecology | Admitting: Obstetrics & Gynecology

## 2018-05-11 ENCOUNTER — Encounter (HOSPITAL_COMMUNITY): Payer: Self-pay

## 2018-05-11 DIAGNOSIS — O2342 Unspecified infection of urinary tract in pregnancy, second trimester: Secondary | ICD-10-CM | POA: Diagnosis not present

## 2018-05-11 DIAGNOSIS — Z7901 Long term (current) use of anticoagulants: Secondary | ICD-10-CM | POA: Diagnosis not present

## 2018-05-11 DIAGNOSIS — Z3A32 32 weeks gestation of pregnancy: Secondary | ICD-10-CM | POA: Diagnosis not present

## 2018-05-11 DIAGNOSIS — Z87891 Personal history of nicotine dependence: Secondary | ICD-10-CM | POA: Diagnosis not present

## 2018-05-11 DIAGNOSIS — O9989 Other specified diseases and conditions complicating pregnancy, childbirth and the puerperium: Secondary | ICD-10-CM

## 2018-05-11 DIAGNOSIS — Z885 Allergy status to narcotic agent status: Secondary | ICD-10-CM | POA: Diagnosis not present

## 2018-05-11 DIAGNOSIS — N949 Unspecified condition associated with female genital organs and menstrual cycle: Secondary | ICD-10-CM

## 2018-05-11 DIAGNOSIS — R102 Pelvic and perineal pain: Secondary | ICD-10-CM | POA: Diagnosis not present

## 2018-05-11 DIAGNOSIS — Z86711 Personal history of pulmonary embolism: Secondary | ICD-10-CM | POA: Diagnosis not present

## 2018-05-11 DIAGNOSIS — Z3A23 23 weeks gestation of pregnancy: Secondary | ICD-10-CM | POA: Diagnosis not present

## 2018-05-11 DIAGNOSIS — R109 Unspecified abdominal pain: Secondary | ICD-10-CM | POA: Diagnosis not present

## 2018-05-11 DIAGNOSIS — O26892 Other specified pregnancy related conditions, second trimester: Secondary | ICD-10-CM | POA: Diagnosis not present

## 2018-05-11 DIAGNOSIS — O0992 Supervision of high risk pregnancy, unspecified, second trimester: Secondary | ICD-10-CM

## 2018-05-11 DIAGNOSIS — Z3689 Encounter for other specified antenatal screening: Secondary | ICD-10-CM

## 2018-05-11 LAB — WET PREP, GENITAL
Clue Cells Wet Prep HPF POC: NONE SEEN
SPERM: NONE SEEN
Trich, Wet Prep: NONE SEEN
YEAST WET PREP: NONE SEEN

## 2018-05-11 LAB — URINALYSIS, ROUTINE W REFLEX MICROSCOPIC
BILIRUBIN URINE: NEGATIVE
GLUCOSE, UA: NEGATIVE mg/dL
HGB URINE DIPSTICK: NEGATIVE
Ketones, ur: NEGATIVE mg/dL
Leukocytes, UA: NEGATIVE
Nitrite: NEGATIVE
PH: 6 (ref 5.0–8.0)
Protein, ur: NEGATIVE mg/dL
SPECIFIC GRAVITY, URINE: 1.023 (ref 1.005–1.030)

## 2018-05-11 LAB — HEPARIN ANTI-XA: Heparin Anti-Xa: 0.2 IU/mL

## 2018-05-11 LAB — FETAL FIBRONECTIN: FETAL FIBRONECTIN: NEGATIVE

## 2018-05-11 MED ORDER — CYCLOBENZAPRINE HCL 10 MG PO TABS
10.0000 mg | ORAL_TABLET | Freq: Once | ORAL | Status: AC
  Administered 2018-05-11: 10 mg via ORAL
  Filled 2018-05-11: qty 1

## 2018-05-11 MED ORDER — CYCLOBENZAPRINE HCL 10 MG PO TABS: 10.0000 mg | ORAL_TABLET | Freq: Two times a day (BID) | ORAL | 0 refills | Status: DC | PRN

## 2018-05-11 MED ORDER — CEPHALEXIN 500 MG PO CAPS: 500.0000 mg | ORAL_CAPSULE | Freq: Four times a day (QID) | ORAL | 0 refills | Status: AC

## 2018-05-11 NOTE — MAU Provider Note (Signed)
History     CSN: [id number]  Arrival date and time: 05/11/18 8295   First Provider Initiated Contact with Patient 05/11/18 0056      Chief Complaint  Patient presents with  . Abdominal Pain  . Vaginal Discharge   HPI  Carmen [name] is a 27 y.o. G1P0 at [redacted]w[redacted]d who presents to MAU with chief complaint of mild to moderate lower abdominal cramping and scant pale pink vaginal bleeding. These are new problems she noticed within the past week which worsened significantly after walking around the mall for approximately 90 minutes today.  Abdominal cramping Existing problem, started within the past few days but significantly worse today. 8/10, LLQ, does not radiate, no aggravating or alleviating factors.  Vaginal spotting Existing problem, started within the past week. Reports pale pink spotting when she wipes after voiding. Denies urinary symptoms. Denies recent intercourse  OB History    Gravida  1   Para      Term      Preterm      AB      Living        SAB      TAB      Ectopic      Multiple      Live Births              Past Medical History:  Diagnosis Date  . Asthma   . Panic attacks   . Pulmonary embolism [facility])     Past Surgical History:  Procedure Laterality Date  . NO PAST SURGERIES      Family History  Problem Relation Age of Onset  . Pulmonary embolism Mother     Social History   Tobacco Use  . Smoking status: Former Smoker    Types: Cigarettes  . Smokeless tobacco: Never Used  Substance Use Topics  . Alcohol use: Not Currently  . Drug use: No    Allergies:  Allergies  Allergen Reactions  . Morphine And Related Anaphylaxis    Medications Prior to Admission  Medication Sig Dispense Refill Last Dose  . enoxaparin (LOVENOX) 60 MG/0.6ML injection Inject into the skin.   05/10/2018 at Unknown time  . Prenatal Vit-Fe Phos-FA-Omega (VITAFOL GUMMIES) 3.33-0.333-34.8 MG CHEW CHEW 3 GUMMIES BY MOUTH EVERY DAY   05/10/2018 at Unknown time   . docusate sodium (COLACE) 100 MG capsule Take by mouth.   Unknown at Unknown time  . Doxylamine-Pyridoxine 10-10 MG TBEC Take 1 tablet by mouth at bedtime as needed (nausea). 100 tablet 5 Unknown at Unknown time  . famotidine (PEPCID) 20 MG tablet Take 1 tablet (20 mg total) by mouth 2 (two) times daily. 60 tablet 1 More than a month at Unknown time  . ondansetron (ZOFRAN) 8 MG tablet Take 1 tablet (8 mg total) by mouth every 8 (eight) hours as needed for nausea or vomiting. 60 tablet 1 Unknown at Unknown time  . ondansetron (ZOFRAN-ODT) 8 MG disintegrating tablet Take by mouth.   Unknown at Unknown time  . Prenatal Vit-Fe Fumarate-FA (PRENATAL COMPLETE) 14-0.4 MG TABS Take 1 tablet by mouth daily. 60 each 0 Unknown at Unknown time    Review of Systems  Respiratory: Negative for shortness of breath.   Gastrointestinal: Positive for abdominal pain. Negative for nausea and vomiting.  Musculoskeletal: Negative for back pain.  Neurological: Negative for headaches.  All other systems reviewed and are negative.  Physical Exam   Blood pressure 108/69, pulse 100, temperature 98.4 F (36.9 C), resp. rate 18,  height 4\' 11"  (1.499 m), weight 140 lb (63.5 kg), last menstrual period 11/27/2017.  Physical Exam  Nursing note and vitals reviewed. Constitutional: She is oriented to person, place, and time. She appears well-developed and well-nourished.  Cardiovascular: Normal rate, regular rhythm, normal heart sounds and intact distal pulses.  Respiratory: Effort normal and breath sounds normal.  GI:  Gravid  Genitourinary: Uterus normal. Vaginal discharge found.  Genitourinary Comments: SVE: closed/thick/posterior  Grayish-white fluid pooling  Neurological: She is alert and oriented to person, place, and time. She has normal reflexes.  Skin: Skin is warm and dry.  Psychiatric: She has a normal mood and affect. Her behavior is normal. Judgment and thought content normal.    MAU Course   Procedures  MDM Reactive NST: baseline 145/150, moderate variability, positive accels, no decels Toco: uterine irritability with irregular contractions q 2-6 minutes Pain managed with Flexeril given in MAU, patient sleeping through contractions Closed cervix, negative FFN Poor PO hydration with water, per patient 3-4 glasses of water today  Patient Vitals for the past 24 hrs:  BP Temp Pulse Resp Height Weight  05/11/18 0027 108/69 98.4 F (36.9 C) 100 18 4\' 11"  (1.499 m) 140 lb (63.5 kg)    Orders Placed This Encounter  Procedures  . Wet prep, genital  . Culture, OB Urine  . Urinalysis, Routine w reflex microscopic  . Fetal fibronectin  . Discharge patient   Results for orders placed or performed during the hospital encounter of 05/11/18 (from the past 24 hour(s))  Urinalysis, Routine w reflex microscopic     Status: None   Collection Time: 05/11/18 12:34 AM  Result Value Ref Range   Color, Urine YELLOW YELLOW   APPearance CLEAR CLEAR   Specific Gravity, Urine 1.023 1.005 - 1.030   pH 6.0 5.0 - 8.0   Glucose, UA NEGATIVE NEGATIVE mg/dL   Hgb urine dipstick NEGATIVE NEGATIVE   Bilirubin Urine NEGATIVE NEGATIVE   Ketones, ur NEGATIVE NEGATIVE mg/dL   Protein, ur NEGATIVE NEGATIVE mg/dL   Nitrite NEGATIVE NEGATIVE   Leukocytes, UA NEGATIVE NEGATIVE  Wet prep, genital     Status: Abnormal   Collection Time: 05/11/18  1:09 AM  Result Value Ref Range   Yeast Wet Prep HPF POC NONE SEEN NONE SEEN   Trich, Wet Prep NONE SEEN NONE SEEN   Clue Cells Wet Prep HPF POC NONE SEEN NONE SEEN   WBC, Wet Prep HPF POC MODERATE (A) NONE SEEN   Sperm NONE SEEN   Fetal fibronectin     Status: None   Collection Time: 05/11/18  1:09 AM  Result Value Ref Range   Fetal Fibronectin NEGATIVE NEGATIVE   Meds ordered this encounter  Medications  . cyclobenzaprine (FLEXERIL) tablet 10 mg  . cyclobenzaprine (FLEXERIL) 10 MG tablet    Sig: Take 1 tablet (10 mg total) by mouth 2 (two) times  daily as needed for muscle spasms.    Dispense:  20 tablet    Refill:  0    Order Specific Question:   Supervising Provider    Answer:   Reva BoresPRATT, TANYA S [[id number]]  . cephALEXin (KEFLEX) 500 MG capsule    Sig: Take 1 capsule (500 mg total) by mouth 4 (four) times daily for 10 days.    Dispense:  40 capsule    Refill:  0    Order Specific Question:   Supervising Provider    Answer:   Reva BoresPRATT, TANYA S [[id number]]     Assessment and Plan  --  27 y.o. G1P0 at [redacted]w[redacted]d  --Reactive NST --UTI and round ligament pain, rx sent to pharmacy as described above, culture ordered --Reviewed general obstetric precautions including but not limited to falls, fever, vaginal bleeding, leaking of fluid, decreased fetal movement, headache not relieved by Tylenol, rest and PO hydration. --Discharge home in stable condition  Calvert Cantor, [place] 05/11/2018, 2:33 AM

## 2018-05-11 NOTE — Discharge Instructions (Signed)

## 2018-05-11 NOTE — MAU Note (Signed)
Having some lower abd cramping LLQ after walking at mall. Several times tonight when I have wiped I see light pink on tissue. Have cyst on L inner buttocks that started Monday but has been getting better.

## 2018-05-12 LAB — CULTURE, OB URINE

## 2018-05-23 ENCOUNTER — Ambulatory Visit (HOSPITAL_COMMUNITY)
Admission: RE | Admit: 2018-05-23 | Discharge: 2018-05-23 | Disposition: A | Discharge status: Home / Self Care | Payer: Medicaid Other | Source: Ambulatory Visit | Attending: Obstetrics & Gynecology | Admitting: Obstetrics & Gynecology

## 2018-05-23 DIAGNOSIS — O88212 Thromboembolism in pregnancy, second trimester: Secondary | ICD-10-CM | POA: Diagnosis not present

## 2018-05-23 DIAGNOSIS — Z3A25 25 weeks gestation of pregnancy: Secondary | ICD-10-CM | POA: Diagnosis not present

## 2018-05-23 DIAGNOSIS — Z362 Encounter for other antenatal screening follow-up: Secondary | ICD-10-CM | POA: Insufficient documentation

## 2018-06-03 ENCOUNTER — Encounter: Payer: Self-pay | Admitting: Student

## 2018-06-05 ENCOUNTER — Other Ambulatory Visit: Payer: Self-pay

## 2018-06-05 DIAGNOSIS — O0992 Supervision of high risk pregnancy, unspecified, second trimester: Secondary | ICD-10-CM

## 2018-06-06 ENCOUNTER — Telehealth: Payer: Self-pay | Admitting: *Deleted

## 2018-06-06 ENCOUNTER — Encounter: Payer: Medicaid Other | Admitting: Student

## 2018-06-06 ENCOUNTER — Other Ambulatory Visit: Payer: Medicaid Other

## 2018-06-06 NOTE — Telephone Encounter (Signed)
Carmen [name] called this am and asked if she has any eating restrictions for her glucose test tomorrow. I called Carmen [name] and left a message she does - and that she should not eat or drink after midnight tonight . Call if questions.  I called back again and she answered and I gave her the instructions as above.

## 2018-06-07 ENCOUNTER — Other Ambulatory Visit: Payer: Medicaid Other

## 2018-06-07 DIAGNOSIS — O0992 Supervision of high risk pregnancy, unspecified, second trimester: Secondary | ICD-10-CM | POA: Diagnosis not present

## 2018-06-08 LAB — CBC
Hematocrit: 27.4 % — ABNORMAL LOW (ref 34.0–46.6)
Hemoglobin: 8.6 g/dL — ABNORMAL LOW (ref 11.1–15.9)
MCH: 26.3 pg — ABNORMAL LOW (ref 26.6–33.0)
MCHC: 31.4 g/dL — ABNORMAL LOW (ref 31.5–35.7)
MCV: 84 fL (ref 79–97)
Platelets: 184 10*3/uL (ref 150–450)
RBC: 3.27 x10E6/uL — ABNORMAL LOW (ref 3.77–5.28)
RDW: 12.8 % (ref 12.3–15.4)
WBC: 8.9 10*3/uL (ref 3.4–10.8)

## 2018-06-08 LAB — GLUCOSE TOLERANCE, 2 HOURS W/ 1HR
GLUCOSE, 1 HOUR: 130 mg/dL (ref 65–179)
GLUCOSE, 2 HOUR: 104 mg/dL (ref 65–152)
Glucose, Fasting: 72 mg/dL (ref 65–91)

## 2018-06-08 LAB — HIV ANTIBODY (ROUTINE TESTING W REFLEX): HIV SCREEN 4TH GENERATION: NONREACTIVE

## 2018-06-08 LAB — RPR: RPR: NONREACTIVE

## 2018-06-11 ENCOUNTER — Ambulatory Visit (INDEPENDENT_AMBULATORY_CARE_PROVIDER_SITE_OTHER): Payer: Medicaid Other | Admitting: Nurse Practitioner

## 2018-06-11 VITALS — BP 108/65 | HR 94 | Wt 143.3 lb

## 2018-06-11 DIAGNOSIS — O0990 Supervision of high risk pregnancy, unspecified, unspecified trimester: Secondary | ICD-10-CM

## 2018-06-11 DIAGNOSIS — O0993 Supervision of high risk pregnancy, unspecified, third trimester: Secondary | ICD-10-CM

## 2018-06-11 DIAGNOSIS — O88213 Thromboembolism in pregnancy, third trimester: Secondary | ICD-10-CM

## 2018-06-11 DIAGNOSIS — D508 Other iron deficiency anemias: Secondary | ICD-10-CM

## 2018-06-11 DIAGNOSIS — O88211 Thromboembolism in pregnancy, first trimester: Secondary | ICD-10-CM

## 2018-06-11 MED ORDER — FERROUS FUMARATE 324 (106 FE) MG PO TABS: 1.0000 | ORAL_TABLET | Freq: Two times a day (BID) | ORAL | 3 refills | Status: DC

## 2018-06-11 MED ORDER — VITAFOL GUMMIES 3.33-0.333-34.8 MG PO CHEW: 3.0000 | CHEWABLE_TABLET | Freq: Every day | ORAL | 11 refills | Status: DC

## 2018-06-11 MED ORDER — HYDROCORTISONE 2.5 % EX CREA: TOPICAL_CREAM | Freq: Two times a day (BID) | CUTANEOUS | 2 refills | Status: DC

## 2018-06-11 NOTE — Progress Notes (Signed)
    Subjective:  Carmen [name] is a 27 y.o. G1P0 at [redacted]w[redacted]d being seen today for ongoing prenatal care.  She is currently monitored for the following issues for this high-risk pregnancy and has Pulmonary embolism affecting pregnancy in first trimester and Supervision of high-risk pregnancy on their problem list.  Patient reports periodic pain in left chest like when the pulmonary embolism started and frequent nose bleeds at night..  Contractions: Irritability. Vag. Bleeding: None.  Movement: Present. Denies leaking of fluid.   The following portions of the patient's history were reviewed and updated as appropriate: allergies, current medications, past family history, past medical history, past social history, past surgical history and problem list. Problem list updated.  Objective:   Vitals:   06/11/18 1008  BP: 108/65  Pulse: 94  Weight: 143 lb 4.8 oz (65 kg)  Pulse O2 sat - 100%  Fetal Status: Fetal Heart Rate (bpm): 154 Fundal Height: 29 cm Movement: Present     General:  Alert, oriented and cooperative. Patient is in no acute distress.  Skin: Skin is warm and dry. No rash noted.   Cardiovascular: Normal heart rate noted, no murmur  Respiratory: Normal respiratory effort, no problems with respiration noted, lungs clear bilaterally  Abdomen: Soft, gravid, appropriate for gestational age. Pain/Pressure: Present     Pelvic:  Cervical exam deferred        Extremities: Normal range of motion.  Edema: Trace  Mental Status: Normal mood and affect. Normal behavior. Normal judgment and thought content.   Urinalysis:      Assessment and Plan:  Pregnancy: G1P0 at [redacted]w[redacted]d  1. Supervision of high risk pregnancy, antepartum Discussed importance of childbirth and breastfeeding classes. Advised nasal saline spray at night.  2. Pulmonary embolism affecting pregnancy in first trimester On Lovenox and taking BID.  Consult with Dr. Vergie Living about chest pain and nose bleeds.  Will do referral to  hematology for consultation (family history of childbirth related pulomonary embolism).  Advised client to take Pepcid to rule out the possibility of heartburn contributing to chest pain.  She has not been taking antacids.  3. Other iron deficiency anemia Reviewed low hemoglobin with client and risk of blood transfusion possibly being needed postpartum.  Prescribed iron supplement BID with stool softener.    Preterm labor symptoms and general obstetric precautions including but not limited to vaginal bleeding, contractions, leaking of fluid and fetal movement were reviewed in detail with the patient. Please refer to After Visit Summary for other counseling recommendations.  Return in about 2 weeks (around 06/25/2018) for [facility] - MD if possible.  Nolene Bernheim, RN, MSN, NP-BC Nurse Practitioner, [facility] for [employer], [facility] 06/11/2018 11:17 AM

## 2018-06-11 NOTE — Patient Instructions (Addendum)
Colace Surfak Miralax  Nasal Saline   Iron Deficiency Anemia, Adult Iron-deficiency anemia is when you have a low amount of red blood cells or hemoglobin. This happens because you have too little iron in your body. Hemoglobin carries oxygen to parts of the body. Anemia can cause your body to not get enough oxygen. It may or may not cause symptoms. Follow these instructions at home: Medicines  Take over-the-counter and prescription medicines only as told by your doctor. This includes iron pills (supplements) and vitamins.  If you cannot handle taking iron pills by mouth, ask your doctor about getting iron through: ? A vein (intravenously). ? A shot (injection) into a muscle.  Take iron pills when your stomach is empty. If you cannot handle this, take them with food.  Do not drink milk or take antacids at the same time as your iron pills.  To prevent trouble pooping (constipation), eat fiber or take medicine (stool softener) as told by your doctor. Eating and drinking  Talk with your doctor before changing the foods you eat. He or she may tell you to eat foods that have a lot of iron, such as: ? Liver. ? Lowfat (lean) beef. ? Breads and cereals that have iron added to them (fortified breads and cereals). ? Eggs. ? Dried fruit. ? Dark green, leafy vegetables.  Drink enough fluid to keep your pee (urine) clear or pale yellow.  Eat fresh fruits and vegetables that are high in vitamin C. They help your body to use iron. Foods with a lot of vitamin C include: ? Oranges. ? Peppers. ? Tomatoes. ? Mangoes. General instructions  Return to your normal activities as told by your doctor. Ask your doctor what activities are safe for you.  Keep yourself clean, and keep things clean around you (your surroundings). Anemia can make you get sick more easily.  Keep all follow-up visits as told by your doctor. This is important. Contact a doctor if:  You feel sick to your stomach  (nauseous).  You throw up (vomit).  You feel weak.  You are sweating for no clear reason.  You have trouble pooping, such as: ? Pooping (having a bowel movement) less than 3 times a week. ? Straining to poop. ? Having poop that is hard, dry, or larger than normal. ? Feeling full or bloated. ? Pain in the lower belly. ? Not feeling better after pooping. Get help right away if:  You pass out (faint). If this happens, do not drive yourself to the hospital. Call your local emergency services (911 in the [place]  You have chest pain.  You have shortness of breath that: ? Is very bad. ? Gets worse with physical activity.  You have a fast heartbeat.  You get light-headed when getting up from sitting or lying down. This information is not intended to replace advice given to you by your health care provider. Make sure you discuss any questions you have with your health care provider. Document Released: 10/28/2010 Document Revised: 06/14/2016 Document Reviewed: 06/14/2016 Elsevier Interactive Patient Education  2017 [place] Education Options: [facility] Department Classes:  Childbirth education classes can help you get ready for a positive parenting experience. You can also meet other expectant parents and get free stuff for your baby. Each class runs for five weeks on the same night and costs $45 for the mother-to-be and her support person. Medicaid covers the cost if you are eligible. Call [phone] to register. [facility] Childbirth Education:  [phone] or [phone] or [email or web address]  Baby & Me Class: Discuss newborn & infant parenting and family adjustment issues with other new mothers in a relaxed environment. Each week brings a new speaker or baby-centered activity. We encourage new mothers to join us every Thursday at 11:00am. Babies birth until crawling. No registration or fee. Daddy Boot Camp: This course offers  Dads-to-be the tools and knowledge needed to feel confident on their journey to becoming new fathers. Experienced dads, who have been trained as coaches, teach dads-to-be how to hold, comfort, diaper, swaddle and play with their infant while being able to support the new mom as well. A class for men taught by men. $25/dad Big Brother/Big Sister: Let your children share in the joy of a new brother or sister in this special class designed just for them. Class includes discussion about how families care for babies: swaddling, holding, diapering, safety as well as how they can be helpful in their new role. This class is designed for children ages 2 to 6, but any age is welcome. Please register each child individually. $5/child  Mom Talk: This mom-led group offers support and connection to mothers as they journey through the adjustments and struggles of that sometimes overwhelming first year after the birth of a child. Tuesdays at 10:00am and Thursdays at 6:00pm. Babies welcome. No registration or fee. Breastfeeding Support Group: This group is a mother-to-mother support circle where moms have the opportunity to share their breastfeeding experiences. A Lactation Consultant is present for questions and concerns. Meets each Tuesday at 11:00am. No fee or registration. Breastfeeding Your Baby: Learn what to expect in the first days of breastfeeding your newborn.  This class will help you feel more confident with the skills needed to begin your breastfeeding experience. Many new mothers are concerned about breastfeeding after leaving the hospital. This class will also address the most common fears and challenges about breastfeeding during the first few weeks, months and beyond. (call for fee) Comfort Techniques and Tour: This 2 hour interactive class will provide you the opportunity to learn & practice hands-on techniques that can help relieve some of the discomfort of labor and encourage your baby to rotate toward the  best position for birth. You and your partner will be able to try a variety of labor positions with birth balls and rebozos as well as practice breathing, relaxation, and visualization techniques. A tour of the [facility] is included with this class. $20 per registrant and support person Childbirth Class- Weekend Option: This class is a Weekend version of our Birth & Baby series. It is designed for parents who have a difficult time fitting several weeks of classes into their schedule. It covers the care of your newborn and the basics of labor and childbirth. It also includes a Maternity Care Center Tour of [facility] and lunch. The class is held two consecutive days: beginning on Friday evening from 6:30 - 8:30 p.m. and the next day, Saturday from 9 a.m. - 4 p.m. (call for fee) Waterbirth Class: Interested in a waterbirth?  This informational class will help you discover whether waterbirth is the right fit for you. Education about waterbirth itself, supplies you would need and how to assemble your support team is what you can expect from this class. Some obstetrical practices require this class in order to pursue a waterbirth. (Not all obstetrical practices offer waterbirth-check with your healthcare provider.) Register only the expectant mom, but you are encouraged   to bring your partner to class! Required if planning waterbirth, no fee. Infant/Child CPR: Parents, grandparents, babysitters, and friends learn Cardio-Pulmonary Resuscitation skills for infants and children. You will also learn how to treat both conscious and unconscious choking in infants and children. This Family & Friends program does not offer certification. Register each participant individually to ensure that enough mannequins are available. (Call for fee) Grandparent Love: Expecting a grandbaby? This class is for you! Learn about the latest infant care and safety recommendations and ways to support your own  child as he or she transitions into the parenting role. Taught by Registered Nurses who are childbirth instructors, but most importantly...they are grandmothers too! $10/person. Childbirth Class- Natural Childbirth: This series of 5 weekly classes is for expectant parents who want to learn and practice natural methods of coping with the process of labor and childbirth. Relaxation, breathing, massage, visualization, role of the partner, and helpful positioning are highlighted. Participants learn how to be confident in their body's ability to give birth. This class will empower and help parents make informed decisions about their own care. Includes discussion that will help new parents transition into the immediate postpartum period. Maternity Care Center Tour of [facility] is included. We suggest taking this class between 25-32 weeks, but it's only a recommendation. $75 per registrant and one support person or $30 Medicaid. Childbirth Class- 3 week Series: This option of 3 weekly classes helps you and your labor partner prepare for childbirth. Newborn care, labor & birth, cesarean birth, pain management, and comfort techniques are discussed and a Maternity Care Center Tour of [facility] is included. The class meets at the same time, on the same day of the week for 3 consecutive weeks beginning with the starting date you choose. $60 for registrant and one support person.  Marvelous Multiples: Expecting twins, triplets, or more? This class covers the differences in labor, birth, parenting, and breastfeeding issues that face multiples' parents. NICU tour is included. Led by a Certified Childbirth Educator who is the mother of twins. No fee. Caring for Baby: This class is for expectant and adoptive parents who want to learn and practice the most up-to-date newborn care for their babies. Focus is on birth through the first six weeks of life. Topics include feeding, bathing, diapering, crying, umbilical  cord care, circumcision care and safe sleep. Parents learn to recognize symptoms of illness and when to call the pediatrician. Register only the mom-to-be and your partner or support person can plan to come with you! $10 per registrant and support person Childbirth Class- online option: This online class offers you the freedom to complete a Birth and Baby series in the comfort of your own home. The flexibility of this option allows you to review sections at your own pace, at times convenient to you and your support people. It includes additional video information, animations, quizzes, and extended activities. Get organized with helpful eClass tools, checklists, and trackers. Once you register online for the class, you will receive an email within a few days to accept the invitation and begin the class when the time is right for you. The content will be available to you for 60 days. $60 for 60 days of online access for you and your support people.  Local Doulas: Natural Baby Doulas [email or web address] Tel: [phone] [email or web address] [facility] [phone] [email or web address] The Labor Ladies  (also do waterbirth tub rental) [phone] [email or web address] [email or web address] [phone] [email or web address] Sacred Rhythms  [phone] [email or web address] [place] [facility]   Association (PADA) [email or web address] [place] [facility]  [email or web address] Considering Waterbirth? Guide for patients at [facility] [employer]  Why consider waterbirth?  . Gentle birth for babies . Less pain medicine used in labor . May allow for passive descent/less pushing . May reduce perineal tears  . More mobility and instinctive maternal position changes . Increased maternal  relaxation . Reduced blood pressure in labor  Is waterbirth safe? What are the risks of infection, drowning or other complications?  . Infection: o Very low risk (3.7 % for tub vs 4.8% for bed) o 7 in 8000 waterbirths with documented infection o Poorly cleaned equipment most common cause o Slightly lower group B strep transmission rate  . Drowning o Maternal:  - Very low risk   - Related to seizures or fainting o Newborn:  - Very low risk. No evidence of increased risk of respiratory problems in multiple large studies - Physiological protection from breathing under water - Avoid underwater birth if there are any fetal complications - Once baby's head is out of the water, keep it out.  . Birth complication o Some reports of cord trauma, but risk decreased by bringing baby to surface gradually o No evidence of increased risk of shoulder dystocia. Mothers can usually change positions faster in water than in a bed, possibly aiding the maneuvers to free the shoulder.   You must attend a Doren Custard class at [facility]  3rd Wednesday of every month from 7-9pm  [employer] by calling [phone] or online at [email or web address]  Bring [place] the certificate from the class to your prenatal appointment  Meet with a midwife at 36 weeks to see if you can still plan a waterbirth and to sign the consent.   Purchase or rent the following supplies:   Water Birth Pool (Birth Pool in a Box or [place] for instance)  (Tubs start ~$125)  Single-use disposable tub liner designed for your brand of tub  New garden hose labeled "lead-free", "suitable for drinking water",  Electric drain pump to remove water (We recommend 792 gallon per hour or greater pump.)   Separate garden hose to remove the dirty water  Fish net  Bathing suit top (optional)  Long-handled mirror (optional)  Places to purchase or rent supplies  GotWebTools.is for tub purchases and  supplies  Waterbirthsolutions.com for tub purchases and supplies  The Labor Ladies (www.[email or web address]) $275 for tub rental/set-up & take down/kit   [employer] (http://www.[place]ming.com/.htm) Information regarding doulas (labor support) who provide pool rentals  Our practice has a Birth Pool in a Box tub at the hospital that you may borrow on a first-come-first-served basis. It is your responsibility to to set up, clean and break down the tub. We cannot guarantee the availability of this tub in advance. You are responsible for bringing all accessories listed above. If you do not have all necessary supplies you cannot have a waterbirth.    Things that would prevent you from having a waterbirth:  Premature, <37wks  Previous cesarean birth  Presence of thick meconium-stained fluid  Multiple gestation (Twins, triplets, etc.)  Uncontrolled diabetes or gestational diabetes requiring medication  Hypertension requiring medication or diagnosis of pre-eclampsia  Heavy vaginal bleeding  Non-reassuring fetal heart rate  Active infection (MRSA, etc.). Group B Strep is NOT a contraindication for  waterbirth.  If your labor has to be induced and induction method requires continuous  monitoring of the baby's heart rate  Other risks/issues identified by your  obstetrical provider  Please remember that birth is unpredictable. Under certain unforeseeable circumstances your provider may advise against giving birth in the tub. These decisions will be made on a case-by-case basis and with the safety of you and your baby as our highest priority.      

## 2018-06-11 NOTE — Progress Notes (Signed)
Pt states skin is itching, what creams can she use & having chest pains are coming in spurts.

## 2018-06-17 ENCOUNTER — Encounter: Payer: Self-pay | Admitting: Hematology

## 2018-06-17 ENCOUNTER — Telehealth: Payer: Self-pay | Admitting: Hematology

## 2018-06-17 ENCOUNTER — Telehealth: Payer: Self-pay | Admitting: *Deleted

## 2018-06-17 NOTE — Telephone Encounter (Signed)
Pt left a message stating that she missed a call from our office.  Please call back.

## 2018-06-17 NOTE — Telephone Encounter (Signed)
Pt has been scheduled to see Dr. Candise Che for hematology on 9/25 at 1pm. Pt aware to arrive 30 minutes early. Letter mailed.

## 2018-06-19 NOTE — Telephone Encounter (Signed)
Called pt back, Per her missed call from our office, Pt stated that she had spoken to who she needed to speak to and had no further questions.

## 2018-06-25 ENCOUNTER — Inpatient Hospital Stay (HOSPITAL_COMMUNITY)
Admission: AD | Admit: 2018-06-25 | Discharge: 2018-06-25 | Disposition: A | Discharge status: Home / Self Care | Payer: Medicaid Other | Source: Ambulatory Visit | Attending: Family Medicine | Admitting: Family Medicine

## 2018-06-25 ENCOUNTER — Encounter (HOSPITAL_COMMUNITY): Payer: Self-pay

## 2018-06-25 ENCOUNTER — Other Ambulatory Visit: Payer: Self-pay

## 2018-06-25 DIAGNOSIS — Z3689 Encounter for other specified antenatal screening: Secondary | ICD-10-CM | POA: Diagnosis not present

## 2018-06-25 DIAGNOSIS — O0993 Supervision of high risk pregnancy, unspecified, third trimester: Secondary | ICD-10-CM | POA: Diagnosis not present

## 2018-06-25 DIAGNOSIS — O4703 False labor before 37 completed weeks of gestation, third trimester: Secondary | ICD-10-CM

## 2018-06-25 DIAGNOSIS — Z8759 Personal history of other complications of pregnancy, childbirth and the puerperium: Secondary | ICD-10-CM | POA: Diagnosis not present

## 2018-06-25 DIAGNOSIS — Z87891 Personal history of nicotine dependence: Secondary | ICD-10-CM | POA: Insufficient documentation

## 2018-06-25 DIAGNOSIS — Z3A30 30 weeks gestation of pregnancy: Secondary | ICD-10-CM | POA: Insufficient documentation

## 2018-06-25 DIAGNOSIS — Z86711 Personal history of pulmonary embolism: Secondary | ICD-10-CM

## 2018-06-25 LAB — URINALYSIS, ROUTINE W REFLEX MICROSCOPIC
Bilirubin Urine: NEGATIVE
Glucose, UA: NEGATIVE mg/dL
Hgb urine dipstick: NEGATIVE
Ketones, ur: NEGATIVE mg/dL
Leukocytes, UA: NEGATIVE
Nitrite: NEGATIVE
Protein, ur: NEGATIVE mg/dL
Specific Gravity, Urine: 1.018 (ref 1.005–1.030)
pH: 6 (ref 5.0–8.0)

## 2018-06-25 MED ORDER — NIFEDIPINE 10 MG PO CAPS
10.0000 mg | ORAL_CAPSULE | ORAL | Status: DC | PRN
  Administered 2018-06-25 (×3): 10 mg via ORAL
  Filled 2018-06-25 (×3): qty 1

## 2018-06-25 NOTE — MAU Note (Signed)
Pt reports to MAU c/o ctxs that are every 5-10 mins apart. Pt denise bleeding or LOF. +FM. No other complaints at this time.

## 2018-06-25 NOTE — MAU Provider Note (Signed)
History     CSN: [id number]  Arrival date and time: 06/25/18 2036   First Provider Initiated Contact with Patient 06/25/18 2143      Chief Complaint  Patient presents with  . Contractions   Carmen [name] is a 27 y.o. G1P0 at 6958w0d who presents to MAU complaining of contractions. She reports contractions have been occurring for weeks. They became painful this morning and continued throughout the day. She reports contractions are 5-10 minutes apart and this afternoon contractions became closer together. She rates pain 8/10, denies contractions taking her breath away. Reports +FM, denies vaginal bleeding, vaginal discharge or LOF. She was seen in MAU on 8/3 for same complaints and was closed.    OB History    Gravida  1   Para      Term      Preterm      AB      Living        SAB      TAB      Ectopic      Multiple      Live Births              Past Medical History:  Diagnosis Date  . Asthma   . Panic attacks   . Pulmonary embolism (HCC) 02/08/2018    Past Surgical History:  Procedure Laterality Date  . NO PAST SURGERIES      Family History  Problem Relation Age of Onset  . Pulmonary embolism Mother     Social History   Tobacco Use  . Smoking status: Former Smoker    Types: Cigarettes  . Smokeless tobacco: Never Used  Substance Use Topics  . Alcohol use: Not Currently  . Drug use: No    Allergies:  Allergies  Allergen Reactions  . Morphine And Related Anaphylaxis    Medications Prior to Admission  Medication Sig Dispense Refill Last Dose  . docusate sodium (COLACE) 100 MG capsule Take by mouth.   Past Month at Unknown time  . enoxaparin (LOVENOX) 60 MG/0.6ML injection Inject into the skin.   Past Week at Unknown time  . famotidine (PEPCID) 20 MG tablet Take 1 tablet (20 mg total) by mouth 2 (two) times daily. 60 tablet 1 Past Month at Unknown time  . Ferrous Fumarate (HEMOCYTE) 324 (106 Fe) MG TABS tablet Take 1 tablet (106 mg of iron  total) by mouth 2 (two) times daily. 60 tablet 3 Past Week at Unknown time  . Prenatal Vit-Fe Phos-FA-Omega (VITAFOL GUMMIES) 3.33-0.333-34.8 MG CHEW Chew 3 each by mouth daily. 90 tablet 11 Past Week at Unknown time  . hydrocortisone 2.5 % cream Apply topically 2 (two) times daily. 30 g 2 Unknown at Unknown time    Review of Systems  Constitutional: Negative.   Respiratory: Negative.   Cardiovascular: Negative.   Gastrointestinal: Positive for abdominal pain. Negative for constipation, diarrhea, nausea and vomiting.  Genitourinary: Negative.   Neurological: Negative.    Physical Exam   Blood pressure 116/70, pulse 91, temperature 98.2 F (36.8 C), temperature source Oral, resp. rate 19, height 4\' 11"  (1.499 m), weight 67.2 kg, last menstrual period 11/27/2017.  Physical Exam  Nursing note and vitals reviewed. Constitutional: She is oriented to person, place, and time. She appears well-developed and well-nourished. No distress.  Cardiovascular: Normal rate, regular rhythm and normal heart sounds.  Respiratory: Effort normal and breath sounds normal. No respiratory distress. She has no wheezes.  GI: Soft.  Gravid appropriate for gestation  age  Musculoskeletal: Normal range of motion. She exhibits no edema.  Neurological: She is alert and oriented to person, place, and time.  Skin: Skin is warm and dry.  Psychiatric: She has a normal mood and affect. Her behavior is normal. Thought content normal.   Dilation: Closed Effacement (%): Thick Cervical Position: Posterior Exam by:: V Marlaine Arey CNM  FHR: 150/ moderate variability/ +accels/ no decels  Toco: irregular mild contractions with UI   MAU Course  Procedures  MDM Urinalysis  Hydration  Cervical examination  Procardia 10mg  x3   NST reactive  Cervical reassessment after 2 hours noted no cervical change. Treatments in MAU included Procardia x3. Patient reports pain is better after treatment in MAU and contractions have  spaced. Educated and discussed reasons to return to MAU, follow up as scheduled for prenatal appointment tomorrow- educated on possible cervical exam tomorrow if symptoms continue. Pt stable at time of discharge.   Assessment and Plan   1. Preterm uterine contractions in third trimester, antepartum   2. Supervision of high risk pregnancy in third trimester   3. [redacted] weeks gestation of pregnancy   4. NST (non-stress test) reactive   5. Hx of pulmonary embolus during pregnancy    Discharge home  Return to MAU as needed for increased/worsening contractions, vaginal bleeding, LOF or DFM  Follow up tomorrow for prenatal appointment  Hydration and Rest  Preterm labor precautions and fetal kick counts   Follow-up Information    [facility] on 06/26/2018.   Specialty:  Obstetrics and Gynecology Why:  Follow up as scheduled for prenatal appointment  Contact information: [address] [place] [address] [phone]         Allergies as of 06/25/2018      Reactions   Morphine And Related Anaphylaxis      Medication List    TAKE these medications   COLACE 100 MG capsule Generic drug:  docusate sodium Take by mouth.   enoxaparin 60 MG/0.6ML injection Commonly known as:  LOVENOX Inject into the skin.   famotidine 20 MG tablet Commonly known as:  PEPCID Take 1 tablet (20 mg total) by mouth 2 (two) times daily.   Ferrous Fumarate 324 (106 Fe) MG Tabs tablet Commonly known as:  HEMOCYTE - 106 mg FE Take 1 tablet (106 mg of iron total) by mouth 2 (two) times daily.   hydrocortisone 2.5 % cream Apply topically 2 (two) times daily.   VITAFOL GUMMIES 3.33-0.333-34.8 MG Chew Chew 3 each by mouth daily.       Sharyon Cable CNM 06/25/2018, 10:41 PM

## 2018-06-26 ENCOUNTER — Ambulatory Visit (INDEPENDENT_AMBULATORY_CARE_PROVIDER_SITE_OTHER): Payer: Medicaid Other | Admitting: Family Medicine

## 2018-06-26 ENCOUNTER — Encounter: Payer: Self-pay | Admitting: Family Medicine

## 2018-06-26 VITALS — BP 98/69 | HR 91 | Wt 146.6 lb

## 2018-06-26 DIAGNOSIS — D649 Anemia, unspecified: Secondary | ICD-10-CM | POA: Insufficient documentation

## 2018-06-26 DIAGNOSIS — O0993 Supervision of high risk pregnancy, unspecified, third trimester: Secondary | ICD-10-CM

## 2018-06-26 DIAGNOSIS — O88211 Thromboembolism in pregnancy, first trimester: Secondary | ICD-10-CM

## 2018-06-26 DIAGNOSIS — Z23 Encounter for immunization: Secondary | ICD-10-CM | POA: Diagnosis not present

## 2018-06-26 DIAGNOSIS — O88213 Thromboembolism in pregnancy, third trimester: Secondary | ICD-10-CM

## 2018-06-26 NOTE — Progress Notes (Signed)
   PRENATAL VISIT NOTE  Subjective:  Carmen [name] is a 27 y.o. G1P0 at 6278w1d being seen today for ongoing prenatal care.  She is currently monitored for the following issues for this high-risk pregnancy and has Pulmonary embolism affecting pregnancy in first trimester and Supervision of high-risk pregnancy on their problem list.  - waking up every 20 minutes to go to the bathroom, at least 10x per night, feels  Very tired - not going as much during the day, no urge  - lays on right side and left side  - trouble taking naps during the day  - threatened PTL yesterday, went to [facility], needed Procardia, feeling much better now, no more ctx - pains in chest have been coming back, hematology appt 9/25, woke up clutching chest one night, sometimes SOB with walking but not always, mother also had one  Patient reports no complaints.  Contractions: Irritability. Vag. Bleeding: None.  Movement: Present. Denies leaking of fluid.   The following portions of the patient's history were reviewed and updated as appropriate: allergies, current medications, past family history, past medical history, past social history, past surgical history and problem list. Problem list updated.  Objective:   Vitals:   06/26/18 1129  BP: 98/69  Pulse: 91  Weight: 146 lb 9.6 oz (66.5 kg)    Fetal Status: Fetal Heart Rate (bpm): 157   Movement: Present     General:  Alert, oriented and cooperative. Patient is in no acute distress.  Skin: Skin is warm and dry. No rash noted.   Cardiovascular: Normal heart rate noted  Respiratory: Normal respiratory effort, no problems with respiration noted  Abdomen: Soft, gravid, appropriate for gestational age.  Pain/Pressure: Present     Pelvic: Cervical exam deferred        Extremities: Normal range of motion.  Edema: Trace  Mental Status: Normal mood and affect. Normal behavior. Normal judgment and thought content.   Assessment and Plan:  Pregnancy: G1P0 at 1578w1d  1.  Supervision of high risk pregnancy in third trimester -- reviewed prenatal record, UTD -- will plan for hospital tour and labor classes  -- recommended increased water, fruits with electrolytes -- trial of Benadryl to help with sleeping  -- Tdap vaccine greater than or equal to 7yo IM  2. Pulmonary embolism affecting pregnancy in first trimester: Stable. Occasional chest pains and shortness of breath. No swelling/edema. Able to walk distances without symptoms most of the time.  -- continue current Lovenox tx 1mg /kg BID (currently on 60mg  BID) - current weight is 68kg -- follow-up with hematology scheduled for next week -- counseled on return precautions   3. Normocytic, Iron-Deficiency Anemia: Taking iron. Asymptomatic aside from occasional chest pains but also history of PE confounding presentation.  -- repeat CBC at next visit, consider hemoglobin electrophoresis (not scanned in from prior provider) ferritin, B12, folate, and retic as well   Preterm labor symptoms and general obstetric precautions including but not limited to vaginal bleeding, contractions, leaking of fluid and fetal movement were reviewed in detail with the patient. Please refer to After Visit Summary for other counseling recommendations.  Return in about 2 weeks (around 07/10/2018) for ROB.  Future Appointments  Date Time Provider Department Center  07/03/2018  1:00 PM Johney MaineKale, Gautam Kishore, MD [facility] None  07/10/2018  8:15 AM Conan Bowensavis, Kelly [facility]   Tamera StandsLaurel S Wallace, DO

## 2018-07-02 NOTE — Progress Notes (Signed)
HEMATOLOGY/ONCOLOGY CONSULTATION NOTE  Date of Service: 07/03/2018  Patient Care Team: Patient, No Pcp Per as PCP - General (General Practice)  CHIEF COMPLAINTS/PURPOSE OF CONSULTATION:  History of Pulmonary Embolism in pregnancy   HISTORY OF PRESENTING ILLNESS:   Carmen [name] is a wonderful 27 y.o. female who has been referred to [place] by Nolene Bernheim, NP  for evaluation and management of History of Pulmonary Embolism in pregnancy. The pt reports that she is doing well overall. The pt is currently at [redacted] weeks gestation.   The pt presented to the ED on 02/08/18 at [redacted] weeks gestation with chest pain and SOB, and was found to have a pulmonary embolism of the left pulmonary artery and left lower lobe artery branches. She had IV heparin while she was admitted for a week. She was discharged with Lovenox 60mg  [place] BID for the remainder of pregnancy and 6 weeks post-partum.   The pt reports that this was her first blood clot and her first pregnancy. Her mother also had a blood clot after her second pregnancy. The pt denies ever being on PO contraceptives or estrogen replacement.   She notes that when she was around [redacted] weeks gestation, she developed some pain and discomfort in her chest which worsened over weeks until she developed acute SOB and chest pain "out of no where" on 02/08/18. She notes that her chest discomfort remained for another month, and has returned occasionally since then. She adds that recently, her OBGYN was concerned that her Lovenox dose was not high enough. The pt notes that her OBGYN told her to hold Lovenox about 4 weeks ago, until this appointment today. She notes that she developed some nose bleeds while sleeping at night while on Lovenox, and these nose bleeds have stopped since she stopped Lovenox. The pt notes that she did not need medical attention for any of these nose bleeds.    The pt notes that she began taking PO Iron replacement, and is not sure which kind she is  taking. She notes that the Iron occasionally gives her diarrhea but denies any other concerns with taking iron replacement.   The pt notes that she has a history of anxiety. She notes that she has not historically had heavy periods, nor concerns for anemia.   Of note prior to today's visit, the pt had a CT Chest on 02/08/18 which revealed Positive for pulmonary embolus involving the proximal descending branch of the LPA and left lower lobe pulmonary artery branches. 2. Focal opacity involving the left lower lobe and trace left pleural effusion.   Most recent lab results (06/07/18) of CBC is as follows: all values are WNL except for RBC at 3.27, HGB at 8.6, HCT at 27.4, MCH at 26.3, MCHC at 31.4.  On review of systems, pt reports occasional CP, appropriate weight gain, occasional nose bleeds while on Lovenox, stable energy levels, occasional diarrhea, and denies other concerns for bleeding, vaginal bleeding, leg swelling, calf pain, nausea, vomiting, constipation, and any other symptoms.   On PMHx the pt reports PE. On Family Hx the pt reports Mother with blood clot after second pregnancy, mother and sister with anemia.    MEDICAL HISTORY:  Past Medical History:  Diagnosis Date  . Asthma   . Panic attacks   . Pulmonary embolism (HCC) 02/08/2018    SURGICAL HISTORY: Past Surgical History:  Procedure Laterality Date  . NO PAST SURGERIES      SOCIAL HISTORY: Social History   Socioeconomic  History  . Marital status: Single    Spouse name: Not on file  . Number of children: Not on file  . Years of education: Not on file  . Highest education level: Not on file  Occupational History  . Not on file  Social Needs  . Financial resource strain: Not on file  . Food insecurity:    Worry: Not on file    Inability: Not on file  . Transportation needs:    Medical: Not on file    Non-medical: Not on file  Tobacco Use  . Smoking status: Former Smoker    Types: Cigarettes  . Smokeless  tobacco: Never Used  Substance and Sexual Activity  . Alcohol use: Not Currently  . Drug use: No  . Sexual activity: Yes  Lifestyle  . Physical activity:    Days per week: Not on file    Minutes per session: Not on file  . Stress: Not on file  Relationships  . Social connections:    Talks on phone: Not on file    Gets together: Not on file    Attends religious service: Not on file    Active member of club or organization: Not on file    Attends meetings of clubs or organizations: Not on file    Relationship status: Not on file  . Intimate partner violence:    Fear of current or ex partner: Not on file    Emotionally abused: Not on file    Physically abused: Not on file    Forced sexual activity: Not on file  Other Topics Concern  . Not on file  Social History Narrative  . Not on file    FAMILY HISTORY: Family History  Problem Relation Age of Onset  . Pulmonary embolism Mother     ALLERGIES:  is allergic to morphine and related.  MEDICATIONS:  Current Outpatient Medications  Medication Sig Dispense Refill  . docusate sodium (COLACE) 100 MG capsule Take by mouth.    . enoxaparin (LOVENOX) 60 MG/0.6ML injection Inject 0.6 mLs (60 mg total) into the skin every 12 (twelve) hours. 36 mL 3  . famotidine (PEPCID) 20 MG tablet Take 1 tablet (20 mg total) by mouth 2 (two) times daily. 60 tablet 1  . hydrocortisone 2.5 % cream Apply topically 2 (two) times daily. 30 g 2  . iron polysaccharides (NIFEREX) 150 MG capsule Take 1 capsule (150 mg total) by mouth 2 (two) times daily. 60 capsule 3  . Prenatal Vit-Fe Phos-FA-Omega (VITAFOL GUMMIES) 3.33-0.333-34.8 MG CHEW Chew 3 each by mouth daily. 90 tablet 11   No current facility-administered medications for this visit.     REVIEW OF SYSTEMS:    10 Point review of Systems was done is negative except as noted above.  PHYSICAL EXAMINATION:  . Vitals:   07/03/18 1316  BP: 111/76  Pulse: (!) 104  Resp: 18  Temp: 97.7 F  (36.5 C)  SpO2: 100%   Filed Weights   07/03/18 1316  Weight: 150 lb (68 kg)   .Body mass index is 30.3 kg/m.  GENERAL:alert, in no acute distress and comfortable SKIN: no acute rashes, no significant lesions EYES: conjunctiva are pink and non-injected, sclera anicteric OROPHARYNX: MMM, no exudates, no oropharyngeal erythema or ulceration NECK: supple, no JVD LYMPH:  no palpable lymphadenopathy in the cervical, axillary or inguinal regions LUNGS: clear to auscultation b/l with normal respiratory effort HEART: regular rate & rhythm ABDOMEN:  normoactive bowel sounds , non tender, gravid  uterus causing abdominal distension Extremity: no pedal edema PSYCH: alert & oriented x 3 with fluent speech NEURO: no focal motor/sensory deficits  LABORATORY DATA:  I have reviewed the data as listed  . CBC Latest Ref Rng & Units 07/03/2018 07/03/2018 06/07/2018  WBC 3.9 - 10.3 K/uL 9.4 - 8.9  Hemoglobin 11.6 - 15.9 g/dL 7.8(G8.5(L) - 8.6(L)  Hematocrit 34.0 - 46.6 % 26.4(L) 27.4(L) 27.4(L)  Platelets 145 - 400 K/uL 169 - 184   . CBC    Component Value Date/Time   WBC 9.4 07/03/2018 1434   RBC 3.32 (L) 07/03/2018 1434   HGB 8.5 (L) 07/03/2018 1434   HGB 8.6 (L) 06/07/2018 0905   HGB 12.1 01/31/2018   HCT 26.4 (L) 07/03/2018 1435   HCT 37 01/31/2018   PLT 169 07/03/2018 1434   PLT 184 06/07/2018 0905   PLT 180 01/31/2018   MCV 82.5 07/03/2018 1434   MCV 84 06/07/2018 0905   MCH 25.6 07/03/2018 1434   MCHC 31.0 (L) 07/03/2018 1434   RDW 14.4 07/03/2018 1434   RDW 12.8 06/07/2018 0905   LYMPHSABS 1.8 07/03/2018 1434   MONOABS 0.7 07/03/2018 1434   EOSABS 0.1 07/03/2018 1434   BASOSABS 0.0 07/03/2018 1434     . CMP Latest Ref Rng & Units 07/03/2018 01/25/2018 01/15/2018  Glucose 70 - 99 mg/dL 85 90 956(O103(H)  BUN 6 - 20 mg/dL 7 7 11   Creatinine 0.44 - 1.00 mg/dL 1.300.64 8.650.61 7.840.60  Sodium 135 - 145 mmol/L 137 138 137  Potassium 3.5 - 5.1 mmol/L 3.9 4.2 4.2  Chloride 98 - 111 mmol/L 106  104 104  CO2 22 - 32 mmol/L 25 25 24   Calcium 8.9 - 10.3 mg/dL 6.9(G8.6(L) 9.6 9.3  Total Protein 6.5 - 8.1 g/dL 6.6 - 7.5  Total Bilirubin 0.3 - 1.2 mg/dL 2.9(B0.2(L) - 0.9  Alkaline Phos 38 - 126 U/L 75 - 60  AST 15 - 41 U/L 14(L) - 16  ALT 0 - 44 U/L 14 - 14   . Lab Results  Component Value Date   IRON 25 (L) 07/03/2018   TIBC 563 (H) 07/03/2018   IRONPCTSAT 4 (L) 07/03/2018   (Iron and TIBC)  Lab Results  Component Value Date   FERRITIN 11 07/03/2018   Component     Latest Ref Rng & Units 07/03/2018  Iron     41 - 142 ug/dL 25 (L)  TIBC     284236 - 444 ug/dL 132563 (H)  Saturation Ratios     21 - 57 % 4 (L)  UIBC     ug/dL 440538  Folate, Hemolysate     Not Estab. ng/mL 348.2  HCT     34.0 - 46.6 % 26.4 (L)  Folate, RBC     >498 ng/mL 1,319  Vitamin B12     180 - 914 pg/mL 402  Ferritin     11 - 307 ng/mL 11     RADIOGRAPHIC STUDIES: I have personally reviewed the radiological images as listed and agreed with the findings in the report. No results found.   02/08/18 CT Chest:    ASSESSMENT & PLAN:   27 y.o. female with   1. Pulmonary Embolism in 1st trimester of current pregnancy 2. Iron deficiency anemia  PLAN: -Discussed patient's most recent labs from 06/07/18, anemic with HGB at 8.6. Normal WBC at 8.9k and PLT at 184k -Discussed the 02/08/18 CT Chest which revealed Positive for pulmonary embolus involving the proximal descending  branch of the LPA and left lower lobe pulmonary artery branches. 2. Focal opacity involving the left lower lobe and trace left pleural effusion.  -Discussed that this would be considered a hormonally triggered blood clot, not an unprovoked clot  -would do a limited testing for inherited risk factors - Factor V leiden mutation and prothrombin gene mutation testing given maternal history of VTE. -Pt began Lovenox in early May and began holding Lovenox in late August -Advised that pt return to 60mg  Lovenox [place] BID today, then begin 60mg  Lovenox  once a day at [redacted] weeks gestation, then hold Lovenox 12-24 hours prior to delivery or Cesarean section, then post-delivery return to 60mg  Lovenox once a day until 6 weeks post-delivery  -Will refill Lovenox today  -Recommended that if pt becomes pregnant again, she should take preventative dose of Lovenox 60mg  [place] daily throughout pregnancy and 6 weeks after delivery -Recommended that the pt not use hormonal contraceptives in the future -Recommend Vaseline ointment to nares and nasal saline sprays to keep nose moist and prevent nose bleeds  -Will look for common clotting disorders with blood work today, given first trimester PE  -switched to Iron polysaccharide 150mg  po BID for iron deficiency anemia in addition to prenatal vitamins with iron. May use OTC stool softner like docusate sodium for constipation if needed. -continue close f/u with ObGyn for higher risk pregnancy  Labs today RTC with Dr Candise Che in 4 weeks with labs    All of the patients questions were answered with apparent satisfaction. The patient knows to call the clinic with any problems, questions or concerns.  The total time spent in the appt was 45 minutes and more than 50% was on counseling and direct patient cares.    Wyvonnia Lora MD MS AAHIVMS [facility] Hematology/Oncology Physician [facility]  (Office):       [phone] (Work cell):  [phone] (Fax):           [phone]  07/03/2018 1:57 PM  I, Marcelline Mates, am acting as a scribe for Dr. Candise Che  .I have reviewed the above documentation for accuracy and completeness, and I agree with the above. Johney Maine MD

## 2018-07-03 ENCOUNTER — Inpatient Hospital Stay: Payer: Medicaid Other | Attending: Hematology | Admitting: Hematology

## 2018-07-03 ENCOUNTER — Telehealth: Payer: Self-pay

## 2018-07-03 ENCOUNTER — Inpatient Hospital Stay: Payer: Medicaid Other

## 2018-07-03 VITALS — BP 111/76 | HR 104 | Temp 97.7°F | Resp 18 | Ht 59.0 in | Wt 150.0 lb

## 2018-07-03 DIAGNOSIS — D509 Iron deficiency anemia, unspecified: Secondary | ICD-10-CM | POA: Diagnosis not present

## 2018-07-03 DIAGNOSIS — I2699 Other pulmonary embolism without acute cor pulmonale: Secondary | ICD-10-CM

## 2018-07-03 DIAGNOSIS — D6859 Other primary thrombophilia: Secondary | ICD-10-CM

## 2018-07-03 DIAGNOSIS — O88211 Thromboembolism in pregnancy, first trimester: Secondary | ICD-10-CM

## 2018-07-03 DIAGNOSIS — O99013 Anemia complicating pregnancy, third trimester: Secondary | ICD-10-CM | POA: Insufficient documentation

## 2018-07-03 DIAGNOSIS — O99513 Diseases of the respiratory system complicating pregnancy, third trimester: Secondary | ICD-10-CM

## 2018-07-03 DIAGNOSIS — J90 Pleural effusion, not elsewhere classified: Secondary | ICD-10-CM

## 2018-07-03 LAB — CBC WITH DIFFERENTIAL/PLATELET
BASOS PCT: 0 %
Basophils Absolute: 0 10*3/uL (ref 0.0–0.1)
Eosinophils Absolute: 0.1 10*3/uL (ref 0.0–0.5)
Eosinophils Relative: 1 %
HEMATOCRIT: 27.4 % — AB (ref 34.8–46.6)
HEMOGLOBIN: 8.5 g/dL — AB (ref 11.6–15.9)
LYMPHS ABS: 1.8 10*3/uL (ref 0.9–3.3)
LYMPHS PCT: 19 %
MCH: 25.6 pg (ref 25.1–34.0)
MCHC: 31 g/dL — AB (ref 31.5–36.0)
MCV: 82.5 fL (ref 79.5–101.0)
MONO ABS: 0.7 10*3/uL (ref 0.1–0.9)
MONOS PCT: 7 %
NEUTROS ABS: 6.9 10*3/uL — AB (ref 1.5–6.5)
NEUTROS PCT: 73 %
Platelets: 169 10*3/uL (ref 145–400)
RBC: 3.32 MIL/uL — ABNORMAL LOW (ref 3.70–5.45)
RDW: 14.4 % (ref 11.2–14.5)
WBC: 9.4 10*3/uL (ref 3.9–10.3)

## 2018-07-03 LAB — CMP (CANCER CENTER ONLY)
ALBUMIN: 2.8 g/dL — AB (ref 3.5–5.0)
ALT: 14 U/L (ref 0–44)
ANION GAP: 6 (ref 5–15)
AST: 14 U/L — ABNORMAL LOW (ref 15–41)
Alkaline Phosphatase: 75 U/L (ref 38–126)
BUN: 7 mg/dL (ref 6–20)
CALCIUM: 8.6 mg/dL — AB (ref 8.9–10.3)
CHLORIDE: 106 mmol/L (ref 98–111)
CO2: 25 mmol/L (ref 22–32)
Creatinine: 0.64 mg/dL (ref 0.44–1.00)
GFR, Estimated: >60 mL/min (ref >60)
GLUCOSE: 85 mg/dL (ref 70–99)
POTASSIUM: 3.9 mmol/L (ref 3.5–5.1)
SODIUM: 137 mmol/L (ref 135–145)
Total Bilirubin: 0.2 mg/dL — ABNORMAL LOW (ref 0.3–1.2)
Total Protein: 6.6 g/dL (ref 6.5–8.1)

## 2018-07-03 LAB — IRON AND TIBC
Iron: 25 ug/dL — ABNORMAL LOW (ref 41–142)
SATURATION RATIOS: 4 % — AB (ref 21–57)
TIBC: 563 ug/dL — ABNORMAL HIGH (ref 236–444)
UIBC: 538 ug/dL

## 2018-07-03 LAB — FERRITIN: FERRITIN: 11 ng/mL (ref 11–307)

## 2018-07-03 LAB — VITAMIN B12: Vitamin B-12: 402 pg/mL (ref 180–914)

## 2018-07-03 NOTE — Telephone Encounter (Signed)
Printed avs and calender of upcoming appointment. Per 9/25 los. Arrived patient for lab add on.

## 2018-07-04 LAB — FOLATE RBC
FOLATE, HEMOLYSATE: 348.2 ng/mL
FOLATE, RBC: 1319 ng/mL (ref >498)
HEMATOCRIT: 26.4 % — AB (ref 34.0–46.6)

## 2018-07-04 MED ORDER — ENOXAPARIN SODIUM 60 MG/0.6ML ~~LOC~~ SOLN: 60.0000 mg | Freq: Two times a day (BID) | SUBCUTANEOUS | 3 refills | Status: DC

## 2018-07-04 MED ORDER — POLYSACCHARIDE IRON COMPLEX 150 MG PO CAPS: 150.0000 mg | ORAL_CAPSULE | Freq: Two times a day (BID) | ORAL | 3 refills | Status: DC

## 2018-07-08 LAB — PROTHROMBIN GENE MUTATION

## 2018-07-08 LAB — FACTOR 5 LEIDEN

## 2018-07-10 ENCOUNTER — Ambulatory Visit (INDEPENDENT_AMBULATORY_CARE_PROVIDER_SITE_OTHER): Payer: Medicaid Other | Admitting: Obstetrics and Gynecology

## 2018-07-10 VITALS — BP 101/60 | HR 80 | Wt 152.4 lb

## 2018-07-10 DIAGNOSIS — O0993 Supervision of high risk pregnancy, unspecified, third trimester: Secondary | ICD-10-CM

## 2018-07-10 DIAGNOSIS — O88211 Thromboembolism in pregnancy, first trimester: Secondary | ICD-10-CM

## 2018-07-10 NOTE — Progress Notes (Signed)
   PRENATAL VISIT NOTE  Subjective:  Carmen [name] is a 27 y.o. G1P0 at [redacted]w[redacted]d being seen today for ongoing prenatal care.  She is currently monitored for the following issues for this high-risk pregnancy and has Pulmonary embolism affecting pregnancy in first trimester; Supervision of high-risk pregnancy; and Normocytic anemia on their problem list.  Patient reports occasional contractions.  Contractions: Irritability. Vag. Bleeding: None.  Movement: Present. Denies leaking of fluid.   The following portions of the patient's history were reviewed and updated as appropriate: allergies, current medications, past family history, past medical history, past social history, past surgical history and problem list. Problem list updated.  Objective:   Vitals:   07/10/18 0819  BP: 101/60  Pulse: 80  Weight: 152 lb 6.4 oz (69.1 kg)    Fetal Status: Fetal Heart Rate (bpm): 152   Movement: Present     General:  Alert, oriented and cooperative. Patient is in no acute distress.  Skin: Skin is warm and dry. No rash noted.   Cardiovascular: Normal heart rate noted  Respiratory: Normal respiratory effort, no problems with respiration noted  Abdomen: Soft, gravid, appropriate for gestational age.  Pain/Pressure: Present     Pelvic: Cervical exam deferred      cephalic by palpation  Extremities: Normal range of motion.  Edema: Trace  Mental Status: Normal mood and affect. Normal behavior. Normal judgment and thought content.   Assessment and Plan:  Pregnancy: G1P0 at [redacted]w[redacted]d  1. Supervision of high risk pregnancy in third trimester Reviewed options for contraception, gave info  2. Pulmonary embolism affecting pregnancy in first trimester Cont 60mg  Lovenox [place] BID  Preterm labor symptoms and general obstetric precautions including but not limited to vaginal bleeding, contractions, leaking of fluid and fetal movement were reviewed in detail with the patient. Please refer to After Visit Summary for  other counseling recommendations.  Return in about 2 weeks (around 07/24/2018) for OB visit (MD).  Future Appointments  Date Time Provider Department Center  07/24/2018 10:55 AM Allie Bossier, MD [facility]  07/31/2018  1:45 PM [facility] LAB 2 [facility] None  07/31/2018  2:20 PM Johney Maine, MD [facility] None    Conan Bowens, MD

## 2018-07-10 NOTE — Patient Instructions (Signed)
Etonogestrel implant What is this medicine? ETONOGESTREL (et oh noe JES trel) is a contraceptive (birth control) device. It is used to prevent pregnancy. It can be used for up to 3 years. This medicine may be used for other purposes; ask your health care provider or pharmacist if you have questions. COMMON BRAND NAME(S): Implanon, Nexplanon What should I tell my health care provider before I take this medicine? They need to know if you have any of these conditions: -abnormal vaginal bleeding -blood vessel disease or blood clots -cancer of the breast, cervix, or liver -depression -diabetes -gallbladder disease -headaches -heart disease or recent heart attack -high blood pressure -high cholesterol -kidney disease -liver disease -renal disease -seizures -tobacco smoker -an unusual or allergic reaction to etonogestrel, other hormones, anesthetics or antiseptics, medicines, foods, dyes, or preservatives -pregnant or trying to get pregnant -breast-feeding How should I use this medicine? This device is inserted just under the skin on the inner side of your upper arm by a health care professional. Talk to your pediatrician regarding the use of this medicine in children. Special care may be needed. Overdosage: If you think you have taken too much of this medicine contact a poison control center or emergency room at once. NOTE: This medicine is only for you. Do not share this medicine with others. What if I miss a dose? This does not apply. What may interact with this medicine? Do not take this medicine with any of the following medications: -amprenavir -bosentan -fosamprenavir This medicine may also interact with the following medications: -barbiturate medicines for inducing sleep or treating seizures -certain medicines for fungal infections like ketoconazole and itraconazole -grapefruit juice -griseofulvin -medicines to treat seizures like carbamazepine, felbamate, oxcarbazepine,  phenytoin, topiramate -modafinil -phenylbutazone -rifampin -rufinamide -some medicines to treat HIV infection like atazanavir, indinavir, lopinavir, nelfinavir, tipranavir, ritonavir -St. John's wort This list may not describe all possible interactions. Give your health care provider a list of all the medicines, herbs, non-prescription drugs, or dietary supplements you use. Also tell them if you smoke, drink alcohol, or use illegal drugs. Some items may interact with your medicine. What should I watch for while using this medicine? This product does not protect you against HIV infection (AIDS) or other sexually transmitted diseases. You should be able to feel the implant by pressing your fingertips over the skin where it was inserted. Contact your doctor if you cannot feel the implant, and use a non-hormonal birth control method (such as condoms) until your doctor confirms that the implant is in place. If you feel that the implant may have broken or become bent while in your arm, contact your healthcare provider. What side effects may I notice from receiving this medicine? Side effects that you should report to your doctor or health care professional as soon as possible: -allergic reactions like skin rash, itching or hives, swelling of the face, lips, or tongue -breast lumps -changes in emotions or moods -depressed mood -heavy or prolonged menstrual bleeding -pain, irritation, swelling, or bruising at the insertion site -scar at site of insertion -signs of infection at the insertion site such as fever, and skin redness, pain or discharge -signs of pregnancy -signs and symptoms of a blood clot such as breathing problems; changes in vision; chest pain; severe, sudden headache; pain, swelling, warmth in the leg; trouble speaking; sudden numbness or weakness of the face, arm or leg -signs and symptoms of liver injury like dark yellow or brown urine; general ill feeling or flu-like symptoms;  light-colored   stools; loss of appetite; nausea; right upper belly pain; unusually weak or tired; yellowing of the eyes or skin -unusual vaginal bleeding, discharge -signs and symptoms of a stroke like changes in vision; confusion; trouble speaking or understanding; severe headaches; sudden numbness or weakness of the face, arm or leg; trouble walking; dizziness; loss of balance or coordination Side effects that usually do not require medical attention (report to your doctor or health care professional if they continue or are bothersome): -acne -back pain -breast pain -changes in weight -dizziness -general ill feeling or flu-like symptoms -headache -irregular menstrual bleeding -nausea -sore throat -vaginal irritation or inflammation This list may not describe all possible side effects. Call your doctor for medical advice about side effects. You may report side effects to FDA at 1-800-FDA-1088. Where should I keep my medicine? This drug is given in a hospital or clinic and will not be stored at home. NOTE: This sheet is a summary. It may not cover all possible information. If you have questions about this medicine, talk to your doctor, pharmacist, or health care provider.  2018 Elsevier/Gold Standard (2016-04-13 11:19:22) Intrauterine Device Information An intrauterine device (IUD) is inserted into your uterus to prevent pregnancy. There are two types of IUDs available:  Copper IUD-This type of IUD is wrapped in copper wire and is placed inside the uterus. Copper makes the uterus and fallopian tubes produce a fluid that kills sperm. The copper IUD can stay in place for 10 years.  Hormone IUD-This type of IUD contains the hormone progestin (synthetic progesterone). The hormone thickens the cervical mucus and prevents sperm from entering the uterus. It also thins the uterine lining to prevent implantation of a fertilized egg. The hormone can weaken or kill the sperm that get into the uterus.  One type of hormone IUD can stay in place for 5 years, and another type can stay in place for 3 years.  Your health care provider will make sure you are a good candidate for a contraceptive IUD. Discuss with your health care provider the possible side effects. Advantages of an intrauterine device  IUDs are highly effective, reversible, long acting, and low maintenance.  There are no estrogen-related side effects.  An IUD can be used when breastfeeding.  IUDs are not associated with weight gain.  The copper IUD works immediately after insertion.  The hormone IUD works right away if inserted within 7 days of your period starting. You will need to use a backup method of birth control for 7 days if the hormone IUD is inserted at any other time in your cycle.  The copper IUD does not interfere with your female hormones.  The hormone IUD can make heavy menstrual periods lighter and decrease cramping.  The hormone IUD can be used for 3 or 5 years.  The copper IUD can be used for 10 years. Disadvantages of an intrauterine device  The hormone IUD can be associated with irregular bleeding patterns.  The copper IUD can make your menstrual flow heavier and more painful.  You may experience cramping and vaginal bleeding after insertion. This information is not intended to replace advice given to you by your health care provider. Make sure you discuss any questions you have with your health care provider. Document Released: 08/29/2004 Document Revised: 03/02/2016 Document Reviewed: 03/16/2013 Elsevier Interactive Patient Education  2017 Elsevier Inc.  

## 2018-07-24 ENCOUNTER — Ambulatory Visit (INDEPENDENT_AMBULATORY_CARE_PROVIDER_SITE_OTHER): Payer: Medicaid Other | Admitting: Obstetrics & Gynecology

## 2018-07-24 VITALS — BP 110/72 | HR 95 | Wt 153.0 lb

## 2018-07-24 DIAGNOSIS — O0993 Supervision of high risk pregnancy, unspecified, third trimester: Secondary | ICD-10-CM

## 2018-07-24 DIAGNOSIS — O88211 Thromboembolism in pregnancy, first trimester: Secondary | ICD-10-CM

## 2018-07-24 NOTE — Progress Notes (Signed)
   PRENATAL VISIT NOTE  Subjective:  Carmen [name] is a 27 y.o. G1P0 at [redacted]w[redacted]d being seen today for ongoing prenatal care.  She is currently monitored for the following issues for this high-risk pregnancy and has Pulmonary embolism affecting pregnancy in first trimester; Supervision of high-risk pregnancy; and Normocytic anemia on their problem list.  Patient reports no complaints.  Contractions: Irritability. Vag. Bleeding: None.  Movement: Present. Denies leaking of fluid.   The following portions of the patient's history were reviewed and updated as appropriate: allergies, current medications, past family history, past medical history, past social history, past surgical history and problem list. Problem list updated.  Objective:   Vitals:   07/24/18 1123  BP: 110/72  Pulse: 95  Weight: 153 lb (69.4 kg)    Fetal Status: Fetal Heart Rate (bpm): 137   Movement: Present     General:  Alert, oriented and cooperative. Patient is in no acute distress.  Skin: Skin is warm and dry. No rash noted.   Cardiovascular: Normal heart rate noted  Respiratory: Normal respiratory effort, no problems with respiration noted  Abdomen: Soft, gravid, appropriate for gestational age.  Pain/Pressure: Present     Pelvic: Cervical exam deferred        Extremities: Normal range of motion.  Edema: Trace  Mental Status: Normal mood and affect. Normal behavior. Normal judgment and thought content.   Assessment and Plan:  Pregnancy: G1P0 at [redacted]w[redacted]d  1. Supervision of high risk pregnancy in third trimester   2. Pulmonary embolism affecting pregnancy in first trimester - on Lovenox  3. Anemia- start iron infusion x 2  Preterm labor symptoms and general obstetric precautions including but not limited to vaginal bleeding, contractions, leaking of fluid and fetal movement were reviewed in detail with the patient. Please refer to After Visit Summary for other counseling recommendations.  Return in about 2 weeks  (around 08/07/2018).  Future Appointments  Date Time Provider Department Center  07/31/2018  1:45 PM [facility] 2 [facility] None  07/31/2018  2:20 PM Kale, Corene Cornea, MD [facility] None    Allie Bossier, MD

## 2018-07-24 NOTE — Progress Notes (Signed)
She will see the hematologist next week.

## 2018-07-24 NOTE — Addendum Note (Signed)
Addended by: Allie Bossier on: 07/24/2018 11:51 AM   Modules accepted: Orders

## 2018-07-31 ENCOUNTER — Other Ambulatory Visit: Payer: Self-pay | Admitting: *Deleted

## 2018-07-31 ENCOUNTER — Inpatient Hospital Stay: Payer: Medicaid Other

## 2018-07-31 ENCOUNTER — Inpatient Hospital Stay: Payer: Medicaid Other | Attending: Hematology | Admitting: Hematology

## 2018-07-31 ENCOUNTER — Telehealth: Payer: Self-pay | Admitting: Hematology

## 2018-07-31 VITALS — BP 112/72 | HR 93 | Temp 98.3°F | Resp 18 | Ht 59.0 in | Wt 154.0 lb

## 2018-07-31 DIAGNOSIS — D509 Iron deficiency anemia, unspecified: Secondary | ICD-10-CM

## 2018-07-31 DIAGNOSIS — O99513 Diseases of the respiratory system complicating pregnancy, third trimester: Secondary | ICD-10-CM | POA: Diagnosis not present

## 2018-07-31 DIAGNOSIS — O88211 Thromboembolism in pregnancy, first trimester: Secondary | ICD-10-CM

## 2018-07-31 DIAGNOSIS — I2699 Other pulmonary embolism without acute cor pulmonale: Secondary | ICD-10-CM | POA: Insufficient documentation

## 2018-07-31 LAB — CMP (CANCER CENTER ONLY)
ALT: 12 U/L (ref 0–44)
AST: 15 U/L (ref 15–41)
Albumin: 2.7 g/dL — ABNORMAL LOW (ref 3.5–5.0)
Alkaline Phosphatase: 108 U/L (ref 38–126)
Anion gap: 12 (ref 5–15)
BILIRUBIN TOTAL: 0.4 mg/dL (ref 0.3–1.2)
BUN: 9 mg/dL (ref 6–20)
CHLORIDE: 105 mmol/L (ref 98–111)
CO2: 21 mmol/L — ABNORMAL LOW (ref 22–32)
Calcium: 8.9 mg/dL (ref 8.9–10.3)
Creatinine: 0.72 mg/dL (ref 0.44–1.00)
Glucose, Bld: 117 mg/dL — ABNORMAL HIGH (ref 70–99)
POTASSIUM: 3.6 mmol/L (ref 3.5–5.1)
Sodium: 138 mmol/L (ref 135–145)
TOTAL PROTEIN: 6.7 g/dL (ref 6.5–8.1)

## 2018-07-31 LAB — VITAMIN B12: VITAMIN B 12: 353 pg/mL (ref 180–914)

## 2018-07-31 LAB — CBC WITH DIFFERENTIAL (CANCER CENTER ONLY)
Abs Immature Granulocytes: 0.14 10*3/uL — ABNORMAL HIGH (ref 0.00–0.07)
BASOS ABS: 0 10*3/uL (ref 0.0–0.1)
BASOS PCT: 0 %
Eosinophils Absolute: 0 10*3/uL (ref 0.0–0.5)
Eosinophils Relative: 0 %
HCT: 27.3 % — ABNORMAL LOW (ref 36.0–46.0)
HEMOGLOBIN: 8.5 g/dL — AB (ref 12.0–15.0)
Immature Granulocytes: 2 %
LYMPHS PCT: 18 %
Lymphs Abs: 1.6 10*3/uL (ref 0.7–4.0)
MCH: 25.6 pg — ABNORMAL LOW (ref 26.0–34.0)
MCHC: 31.1 g/dL (ref 30.0–36.0)
MCV: 82.2 fL (ref 80.0–100.0)
Monocytes Absolute: 0.5 10*3/uL (ref 0.1–1.0)
Monocytes Relative: 5 %
NEUTROS ABS: 6.7 10*3/uL (ref 1.7–7.7)
NRBC: 0 % (ref 0.0–0.2)
Neutrophils Relative %: 75 %
PLATELETS: 149 10*3/uL — AB (ref 150–400)
RBC: 3.32 MIL/uL — AB (ref 3.87–5.11)
RDW: 17.1 % — ABNORMAL HIGH (ref 11.5–15.5)
WBC: 9 10*3/uL (ref 4.0–10.5)

## 2018-07-31 LAB — IRON AND TIBC
IRON: 62 ug/dL (ref 41–142)
Saturation Ratios: 11 % — ABNORMAL LOW (ref 21–57)
TIBC: 561 ug/dL — AB (ref 236–444)
UIBC: 499 ug/dL

## 2018-07-31 LAB — FERRITIN: Ferritin: 19 ng/mL (ref 11–307)

## 2018-07-31 NOTE — Progress Notes (Signed)
HEMATOLOGY/ONCOLOGY CONSULTATION NOTE  Date of Service: 07/31/2018  Patient Care Team: Patient, No Pcp Per as PCP - General (General Practice)  CHIEF COMPLAINTS/PURPOSE OF CONSULTATION:  History of Pulmonary Embolism in pregnancy   HISTORY OF PRESENTING ILLNESS:   Carmen [name] is a wonderful 27 y.o. female who has been referred to [place] by Nolene Bernheim, NP  for evaluation and management of History of Pulmonary Embolism in pregnancy. The pt reports that she is doing well overall. The pt is currently at [redacted] weeks gestation.   The pt presented to the ED on 02/08/18 at [redacted] weeks gestation with chest pain and SOB, and was found to have a pulmonary embolism of the left pulmonary artery and left lower lobe artery branches. She had IV heparin while she was admitted for a week. She was discharged with Lovenox 60mg  [place] BID for the remainder of pregnancy and 6 weeks post-partum.   The pt reports that this was her first blood clot and her first pregnancy. Her mother also had a blood clot after her second pregnancy. The pt denies ever being on PO contraceptives or estrogen replacement.   She notes that when she was around [redacted] weeks gestation, she developed some pain and discomfort in her chest which worsened over weeks until she developed acute SOB and chest pain "out of no where" on 02/08/18. She notes that her chest discomfort remained for another month, and has returned occasionally since then. She adds that recently, her OBGYN was concerned that her Lovenox dose was not high enough. The pt notes that her OBGYN told her to hold Lovenox about 4 weeks ago, until this appointment today. She notes that she developed some nose bleeds while sleeping at night while on Lovenox, and these nose bleeds have stopped since she stopped Lovenox. The pt notes that she did not need medical attention for any of these nose bleeds.    The pt notes that she began taking PO Iron replacement, and is not sure which kind she is  taking. She notes that the Iron occasionally gives her diarrhea but denies any other concerns with taking iron replacement.   The pt notes that she has a history of anxiety. She notes that she has not historically had heavy periods, nor concerns for anemia.   Of note prior to today's visit, the pt had a CT Chest on 02/08/18 which revealed Positive for pulmonary embolus involving the proximal descending branch of the LPA and left lower lobe pulmonary artery branches. 2. Focal opacity involving the left lower lobe and trace left pleural effusion.   Most recent lab results (06/07/18) of CBC is as follows: all values are WNL except for RBC at 3.27, HGB at 8.6, HCT at 27.4, MCH at 26.3, MCHC at 31.4.  On review of systems, pt reports occasional CP, appropriate weight gain, occasional nose bleeds while on Lovenox, stable energy levels, occasional diarrhea, and denies other concerns for bleeding, vaginal bleeding, leg swelling, calf pain, nausea, vomiting, constipation, and any other symptoms.   On PMHx the pt reports PE. On Family Hx the pt reports Mother with blood clot after second pregnancy, mother and sister with anemia.  Interval History:   Carmen [name] returns today for management and evaluation of her PE in pregnancy. The patient's last visit with [place] was on 07/03/18. The pt reports that she is doing well overall. She is currently at [redacted] weeks gestation.   The pt reports that she has been feeling nauseous about an  hour after she takes her PO Iron replacement. She denies any concerns for bleeding. The pt notes that she has begun having contractions and her OBGYN has set an induction date at 39 weeks. The pt has continued to take Lovenox and is taking this once a day at this time.   Lab results today (07/31/18) of CBC w/diff, CMP is as follows: all values are WNL except for RBC at 3.32, HGB at 8.5, HCT at 27.3, MCH at 25.6, RDW at 17.1, PLT at 149k, Abs immature granulocytes at 0.14k, CO2 at 21,  Glucose at 117, Albumin at 2.7. 07/31/18 Ferritin is 19 07/31/18 Iron and TIBC revealed all values WNL except for TIBC at 561 and an 11% Saturation raio  On review of systems, pt reports good energy levels, appropriate weight gain, and denies vaginal bleeding, other concerns for bleeding, SOB, CP, leg pain, leg swelling, and any other symptoms.   MEDICAL HISTORY:  Past Medical History:  Diagnosis Date  . Asthma   . Panic attacks   . Pulmonary embolism (HCC) 02/08/2018    SURGICAL HISTORY: Past Surgical History:  Procedure Laterality Date  . NO PAST SURGERIES      SOCIAL HISTORY: Social History   Socioeconomic History  . Marital status: Single    Spouse name: Not on file  . Number of children: Not on file  . Years of education: Not on file  . Highest education level: Not on file  Occupational History  . Not on file  Social Needs  . Financial resource strain: Not on file  . Food insecurity:    Worry: Not on file    Inability: Not on file  . Transportation needs:    Medical: Not on file    Non-medical: Not on file  Tobacco Use  . Smoking status: Former Smoker    Types: Cigarettes  . Smokeless tobacco: Never Used  Substance and Sexual Activity  . Alcohol use: Not Currently  . Drug use: No  . Sexual activity: Yes  Lifestyle  . Physical activity:    Days per week: Not on file    Minutes per session: Not on file  . Stress: Not on file  Relationships  . Social connections:    Talks on phone: Not on file    Gets together: Not on file    Attends religious service: Not on file    Active member of club or organization: Not on file    Attends meetings of clubs or organizations: Not on file    Relationship status: Not on file  . Intimate partner violence:    Fear of current or ex partner: Not on file    Emotionally abused: Not on file    Physically abused: Not on file    Forced sexual activity: Not on file  Other Topics Concern  . Not on file  Social History  Narrative  . Not on file    FAMILY HISTORY: Family History  Problem Relation Age of Onset  . Pulmonary embolism Mother     ALLERGIES:  is allergic to morphine and related.  MEDICATIONS:  Current Outpatient Medications  Medication Sig Dispense Refill  . docusate sodium (COLACE) 100 MG capsule Take by mouth.    . enoxaparin (LOVENOX) 60 MG/0.6ML injection Inject 0.6 mLs (60 mg total) into the skin every 12 (twelve) hours. 36 mL 3  . famotidine (PEPCID) 20 MG tablet Take 1 tablet (20 mg total) by mouth 2 (two) times daily. (Patient not taking: Reported  on 07/24/2018) 60 tablet 1  . hydrocortisone 2.5 % cream Apply topically 2 (two) times daily. (Patient not taking: Reported on 07/24/2018) 30 g 2  . iron polysaccharides (NIFEREX) 150 MG capsule Take 1 capsule (150 mg total) by mouth 2 (two) times daily. (Patient not taking: Reported on 07/24/2018) 60 capsule 3  . Prenatal Vit-Fe Phos-FA-Omega (VITAFOL GUMMIES) 3.33-0.333-34.8 MG CHEW Chew 3 each by mouth daily. 90 tablet 11   No current facility-administered medications for this visit.     REVIEW OF SYSTEMS:    A 10+ POINT REVIEW OF SYSTEMS WAS OBTAINED including neurology, dermatology, psychiatry, cardiac, respiratory, lymph, extremities, GI, GU, Musculoskeletal, constitutional, breasts, reproductive, HEENT.  All pertinent positives are noted in the HPI.  All others are negative.   PHYSICAL EXAMINATION:  . Vitals:   07/31/18 1435  BP: 112/72  Pulse: 93  Resp: 18  Temp: 98.3 F (36.8 C)  SpO2: 99%   Filed Weights   07/31/18 1435  Weight: 154 lb (69.9 kg)   .Body mass index is 31.1 kg/m.  GENERAL:alert, in no acute distress and comfortable SKIN: no acute rashes, no significant lesions EYES: conjunctiva are pink and non-injected, sclera anicteric OROPHARYNX: MMM, no exudates, no oropharyngeal erythema or ulceration NECK: supple, no JVD LYMPH:  no palpable lymphadenopathy in the cervical, axillary or inguinal  regions LUNGS: clear to auscultation b/l with normal respiratory effort HEART: regular rate & rhythm ABDOMEN:  normoactive bowel sounds , non tender, gravid uterus causing abdominal distension. Extremity: no pedal edema PSYCH: alert & oriented x 3 with fluent speech NEURO: no focal motor/sensory deficits   LABORATORY DATA:  I have reviewed the data as listed  . CBC Latest Ref Rng & Units 07/31/2018 07/03/2018 07/03/2018  WBC 4.0 - 10.5 K/uL 9.0 9.4 -  Hemoglobin 12.0 - 15.0 g/dL 1.6(X) 0.9(U) -  Hematocrit 36.0 - 46.0 % 27.3(L) 26.4(L) 27.4(L)  Platelets 150 - 400 K/uL 149(L) 169 -   . CBC    Component Value Date/Time   WBC 9.0 07/31/2018 1415   WBC 9.4 07/03/2018 1434   RBC 3.32 (L) 07/31/2018 1415   HGB 8.5 (L) 07/31/2018 1415   HGB 8.6 (L) 06/07/2018 0905   HGB 12.1 01/31/2018   HCT 27.3 (L) 07/31/2018 1415   HCT 26.4 (L) 07/03/2018 1435   HCT 37 01/31/2018   PLT 149 (L) 07/31/2018 1415   PLT 184 06/07/2018 0905   PLT 180 01/31/2018   MCV 82.2 07/31/2018 1415   MCV 84 06/07/2018 0905   MCH 25.6 (L) 07/31/2018 1415   MCHC 31.1 07/31/2018 1415   RDW 17.1 (H) 07/31/2018 1415   RDW 12.8 06/07/2018 0905   LYMPHSABS 1.6 07/31/2018 1415   MONOABS 0.5 07/31/2018 1415   EOSABS 0.0 07/31/2018 1415   BASOSABS 0.0 07/31/2018 1415     . CMP Latest Ref Rng & Units 07/31/2018 07/03/2018 01/25/2018  Glucose 70 - 99 mg/dL 045(W) 85 90  BUN 6 - 20 mg/dL 9 7 7   Creatinine 0.44 - 1.00 mg/dL 0.98 1.19 1.47  Sodium 135 - 145 mmol/L 138 137 138  Potassium 3.5 - 5.1 mmol/L 3.6 3.9 4.2  Chloride 98 - 111 mmol/L 105 106 104  CO2 22 - 32 mmol/L 21(L) 25 25  Calcium 8.9 - 10.3 mg/dL 8.9 8.2(N) 9.6  Total Protein 6.5 - 8.1 g/dL 6.7 6.6 -  Total Bilirubin 0.3 - 1.2 mg/dL 0.4 5.6(O) -  Alkaline Phos 38 - 126 U/L 108 75 -  AST 15 -  41 U/L 15 14(L) -  ALT 0 - 44 U/L 12 14 -     Lab Results  Component Value Date   FERRITIN 11 07/03/2018   . Lab Results  Component Value Date    IRON 62 07/31/2018   TIBC 561 (H) 07/31/2018   IRONPCTSAT 11 (L) 07/31/2018   (Iron and TIBC)  Lab Results  Component Value Date   FERRITIN 19 07/31/2018      RADIOGRAPHIC STUDIES: I have personally reviewed the radiological images as listed and agreed with the findings in the report. No results found.   02/08/18 CT Chest:    ASSESSMENT & PLAN:   27 y.o. female with   1. Pulmonary Embolism in 1st trimester of current pregnancy  02/08/18 CT Chest revealed Positive for pulmonary embolus involving the proximal descending branch of the LPA and left lower lobe pulmonary artery branches. 2. Focal opacity involving the left lower lobe and trace left pleural effusion  07/03/18 Factor V Leiden was Negative. 07/03/18 Prothrombin gene mutation was Negative   2. Iron deficiency anemia Ferritin improved from 11 to 19 and iron saturation from 4 to 11% Hgb stable at 8.5 PLAN:.  -Discussed that this would be considered a hormonally triggered blood clot, not an unprovoked clot  -did limited testing for inherited risk factors - Factor V leiden mutation and prothrombin gene mutation testing unrevealing -Previously advised that pt return to 60mg  Lovenox [place] BID on 07/03/18, then begin 60mg  Lovenox once a day at [redacted] weeks gestation, then hold Lovenox 12-24 hours prior to delivery or Cesarean section, then post-delivery return to 60mg  Lovenox once a day until 6 weeks post-delivery  -Recommended that if pt becomes pregnant again, she should take preventative dose of Lovenox 60mg  [place] daily throughout pregnancy and 6 weeks after delivery -Recommended that the pt not use hormonal contraceptives in the future -Recommend Vaseline ointment to nares and nasal saline sprays to keep nose moist and prevent nose bleeds  -continue close f/u with ObGyn for higher risk pregnancy -Discussed pt labwork today, 07/31/18; HGB stable at 8.5, Saturation ratio improved to 11% -07/31/18 Ferritin is 19 -Discussed that pt could  receive transfusion when she enters labor if needed -Continue po iron replacement through breast feeding and till ferritin > 50 - take Iron Polysaccharide BID -Will plan to replace Iron IV after delivery which the pt requests  -Advised that if the pt develops increasingly frequent contractions, she should contact her OBGYN -Will see the pt back in 2 months with consideration of IV Injectafer   RTC with Dr Candise Che with labs in 2 months   All of the patients questions were answered with apparent satisfaction. The patient knows to call the clinic with any problems, questions or concerns.  The total time spent in the appt was 30 minutes and more than 50% was on counseling and direct patient cares.     Wyvonnia Lora MD MS AAHIVMS [facility] Hematology/Oncology Physician [facility]  (Office):       [phone] (Work cell):  [phone] (Fax):           [phone]  07/31/2018 3:23 PM  I, Marcelline Mates, am acting as a scribe for Dr. Candise Che  .I have reviewed the above documentation for accuracy and completeness, and I agree with the above. Johney Maine MD

## 2018-07-31 NOTE — Telephone Encounter (Signed)
Appts scheduled avs/calendar printed per 10/23 los °

## 2018-08-01 LAB — FOLATE RBC
FOLATE, RBC: 1405 ng/mL (ref >498)
Folate, Hemolysate: 369.6 ng/mL
Hematocrit: 26.3 % — ABNORMAL LOW (ref 34.0–46.6)

## 2018-08-05 ENCOUNTER — Telehealth: Payer: Self-pay

## 2018-08-05 NOTE — Telephone Encounter (Signed)
Pt called and states she is having irregular painful contractions since early this morning. Pt also states she has had two episodes of small amounts of bright red bleeding. Pt reports good fetal movement. Advised pt to go to [facility] for evaluation of pre term labor, considering previous preterm labor in other pregnancy. Pt verbalized understanding.

## 2018-08-09 ENCOUNTER — Other Ambulatory Visit (HOSPITAL_COMMUNITY)
Admission: RE | Admit: 2018-08-09 | Discharge: 2018-08-09 | Disposition: A | Discharge status: Home / Self Care | Payer: Medicaid Other | Source: Ambulatory Visit | Attending: Obstetrics & Gynecology | Admitting: Obstetrics & Gynecology

## 2018-08-09 ENCOUNTER — Ambulatory Visit (INDEPENDENT_AMBULATORY_CARE_PROVIDER_SITE_OTHER): Payer: Medicaid Other | Admitting: Obstetrics & Gynecology

## 2018-08-09 VITALS — BP 111/75 | HR 96 | Wt 157.9 lb

## 2018-08-09 DIAGNOSIS — O0993 Supervision of high risk pregnancy, unspecified, third trimester: Secondary | ICD-10-CM | POA: Insufficient documentation

## 2018-08-09 DIAGNOSIS — O88211 Thromboembolism in pregnancy, first trimester: Secondary | ICD-10-CM

## 2018-08-09 DIAGNOSIS — D649 Anemia, unspecified: Secondary | ICD-10-CM

## 2018-08-09 NOTE — Progress Notes (Signed)
   PRENATAL VISIT NOTE  Subjective:  Carmen [name] is a 27 y.o. G1P0 at [redacted]w[redacted]d being seen today for ongoing prenatal care.  She is currently monitored for the following issues for this high-risk pregnancy and has Pulmonary embolism affecting pregnancy in first trimester; Supervision of high-risk pregnancy; and Normocytic anemia on their problem list.  Patient reports no complaints.  Contractions: Irritability. Vag. Bleeding: Scant.  Movement: Present. Denies leaking of fluid.   The following portions of the patient's history were reviewed and updated as appropriate: allergies, current medications, past family history, past medical history, past social history, past surgical history and problem list. Problem list updated.  Objective:   Vitals:   08/09/18 1046  BP: 111/75  Pulse: 96  Weight: 157 lb 14.4 oz (71.6 kg)    Fetal Status: Fetal Heart Rate (bpm): 156   Movement: Present     General:  Alert, oriented and cooperative. Patient is in no acute distress.  Skin: Skin is warm and dry. No rash noted.   Cardiovascular: Normal heart rate noted  Respiratory: Normal respiratory effort, no problems with respiration noted  Abdomen: Soft, gravid, appropriate for gestational age.  Pain/Pressure: Present     Pelvic: Cervical exam performed        Extremities: Normal range of motion.  Edema: None  Mental Status: Normal mood and affect. Normal behavior. Normal judgment and thought content.   Assessment and Plan:  Pregnancy: G1P0 at [redacted]w[redacted]d  1. Supervision of high risk pregnancy in third trimester  - GC/Chlamydia probe amp ([place])not at [facility] - Culture, beta strep (group b only)  2. Pulmonary embolism affecting pregnancy in first trimester - her hematologist decreased her lovenox to daily instead of BID  3. Normocytic anemia   Preterm labor symptoms and general obstetric precautions including but not limited to vaginal bleeding, contractions, leaking of fluid and fetal movement were  reviewed in detail with the patient. Please refer to After Visit Summary for other counseling recommendations.  Return in about 1 week (around 08/16/2018).  Future Appointments  Date Time Provider Department Center  08/16/2018 10:35 AM Allie Bossier, MD [facility]OC  08/22/2018 10:35 AM Allie Bossier, MD [facility] WOC  08/29/2018 10:35 AM Allie Bossier, MD [facility] WOC  09/30/2018 10:15 AM [facility] None  09/30/2018 10:40 AM Johney Maine, MD [facility] None    Allie Bossier, MD

## 2018-08-12 LAB — GC/CHLAMYDIA PROBE AMP (~~LOC~~) NOT AT ARMC
CHLAMYDIA, DNA PROBE: NEGATIVE
NEISSERIA GONORRHEA: NEGATIVE

## 2018-08-13 LAB — OB RESULTS CONSOLE GBS: STREP GROUP B AG: NEGATIVE

## 2018-08-13 LAB — OB RESULTS CONSOLE GC/CHLAMYDIA: Gonorrhea: NEGATIVE

## 2018-08-13 LAB — CULTURE, BETA STREP (GROUP B ONLY): Strep Gp B Culture: NEGATIVE

## 2018-08-16 ENCOUNTER — Ambulatory Visit (INDEPENDENT_AMBULATORY_CARE_PROVIDER_SITE_OTHER): Payer: Medicaid Other | Admitting: Obstetrics & Gynecology

## 2018-08-16 VITALS — BP 110/69 | HR 94 | Wt 159.4 lb

## 2018-08-16 DIAGNOSIS — O3663X0 Maternal care for excessive fetal growth, third trimester, not applicable or unspecified: Secondary | ICD-10-CM

## 2018-08-16 DIAGNOSIS — O0993 Supervision of high risk pregnancy, unspecified, third trimester: Secondary | ICD-10-CM

## 2018-08-16 DIAGNOSIS — O88213 Thromboembolism in pregnancy, third trimester: Secondary | ICD-10-CM

## 2018-08-16 DIAGNOSIS — O88219 Thromboembolism in pregnancy, unspecified trimester: Secondary | ICD-10-CM

## 2018-08-16 MED ORDER — ENOXAPARIN SODIUM 60 MG/0.6ML ~~LOC~~ SOLN: 60.0000 mg | SUBCUTANEOUS | 3 refills | Status: DC

## 2018-08-16 NOTE — Progress Notes (Signed)
   PRENATAL VISIT NOTE  Subjective:  Carmen [name] is a 27 y.o. G1P0 at [redacted]w[redacted]d being seen today for ongoing prenatal care.  She is currently monitored for the following issues for this high-risk pregnancy and has Pulmonary embolism, antepartum; Supervision of high-risk pregnancy; and Normocytic anemia on their problem list.  Patient reports no complaints.  Contractions: Irritability. Vag. Bleeding: None.  Movement: Present. Denies leaking of fluid.   The following portions of the patient's history were reviewed and updated as appropriate: allergies, current medications, past family history, past medical history, past social history, past surgical history and problem list. Problem list updated.  Objective:   Vitals:   08/16/18 1122  BP: 110/69  Pulse: 94  Weight: 159 lb 6.4 oz (72.3 kg)    Fetal Status: Fetal Heart Rate (bpm): 159 Fundal Height: 40 cm Movement: Present     General:  Alert, oriented and cooperative. Patient is in no acute distress.  Skin: Skin is warm and dry. No rash noted.   Cardiovascular: Normal heart rate noted  Respiratory: Normal respiratory effort, no problems with respiration noted  Abdomen: Soft, gravid, appropriate for gestational age.  Pain/Pressure: Present     Pelvic: Cervical exam deferred        Extremities: Normal range of motion.  Edema: None  Mental Status: Normal mood and affect. Normal behavior. Normal judgment and thought content.   Assessment and Plan:  Pregnancy: G1P0 at [redacted]w[redacted]d  1. Pulmonary embolism, antepartum On Lovenox 60 mg daily. Plan for delivery at 39 weeks for anticoagulation coordination. Prior to delivery, anticoagulation will be managed as follows:  Stop Lovenox 24 hours prior to cesarean/induction        Will check aPTT on admission        Intrapartum, will allow ambulation and use SCDs. Postpartum anticoagulation plan is: continue Lovenox for 6 weeks Patient instructed to hold anticoagulation for regular painful  contractions requiring evaluation or for SROM. - enoxaparin (LOVENOX) 60 MG/0.6ML injection; Inject 0.6 mLs (60 mg total) into the skin daily.  Dispense: 36 mL; Refill: 3  2. Large for dates affecting management of mother, third trimester, not applicable or unspecified fetus Growth scan ordered. - [place] MFM OB FOLLOW UP; Future  3. Supervision of high risk pregnancy in third trimester Term labor symptoms and general obstetric precautions including but not limited to vaginal bleeding, contractions, leaking of fluid and fetal movement were reviewed in detail with the patient. Please refer to After Visit Summary for other counseling recommendations.  Return in about 1 week (around 08/23/2018) for OB Visit (HOB).  Future Appointments  Date Time Provider Department Center  08/22/2018 10:35 AM Dor[facility], [facility]  08/29/2018 10:35 AM Reva Bores, MD [facility] WOC  09/30/2018 10:15 AM [facility] 4 [facility] None  09/30/2018 10:40 AM Johney Maine, MD [facility] None    Jaynie Collins, MD

## 2018-08-16 NOTE — Patient Instructions (Signed)
Labor Induction Labor induction is when steps are taken to cause a pregnant woman to begin the labor process. Most women go into labor on their own between 37 weeks and 42 weeks of the pregnancy. When this does not happen or when there is a medical need, methods may be used to induce labor. Labor induction causes a pregnant woman's uterus to contract. It also causes the cervix to soften (ripen), open (dilate), and thin out (efface). Usually, labor is not induced before 39 weeks of the pregnancy unless there is a problem with the baby or mother. Before inducing labor, your health care provider will consider a number of factors, including the following:  The medical condition of you and the baby.  How many weeks along you are.  The status of the baby's lung maturity.  The condition of the cervix.  The position of the baby. What are the reasons for labor induction? Labor may be induced for the following reasons:  The health of the baby or mother is at risk.  The pregnancy is overdue by 1 week or more.  The water breaks but labor does not start on its own.  The mother has a health condition or serious illness, such as high blood pressure, infection, placental abruption, or diabetes.  The amniotic fluid amounts are low around the baby.  The baby is distressed. Convenience or wanting the baby to be born on a certain date is not a reason for inducing labor. What methods are used for labor induction? Several methods of labor induction may be used, such as:  Prostaglandin medicine. This medicine causes the cervix to dilate and ripen. The medicine will also start contractions. It can be taken by mouth or by inserting a suppository into the vagina.  Inserting a thin tube (catheter) with a balloon on the end into the vagina to dilate the cervix. Once inserted, the balloon is expanded with water, which causes the cervix to open.  Stripping the membranes. Your health care provider separates  amniotic sac tissue from the cervix, causing the cervix to be stretched and causing the release of a hormone called progesterone. This may cause the uterus to contract. It is often done during an office visit. You will be sent home to wait for the contractions to begin. You will then come in for an induction.  Breaking the water. Your health care provider makes a hole in the amniotic sac using a small instrument. Once the amniotic sac breaks, contractions should begin. This may still take hours to see an effect.  Medicine to trigger or strengthen contractions. This medicine is given through an IV access tube inserted into a vein in your arm. All of the methods of induction, besides stripping the membranes, will be done in the hospital. Induction is done in the hospital so that you and the baby can be carefully monitored. How long does it take for labor to be induced? Some inductions can take up to 2-3 days. Depending on the cervix, it usually takes less time. It takes longer when you are induced early in the pregnancy or if this is your first pregnancy. If a mother is still pregnant and the induction has been going on for 2-3 days, either the mother will be sent home or a cesarean delivery will be needed. What are the risks associated with labor induction? Some of the risks of induction include:  Changes in fetal heart rate, such as too high, too low, or erratic.  Fetal distress.    Chance of infection for the mother and baby.  Increased chance of having a cesarean delivery.  Breaking off (abruption) of the placenta from the uterus (rare).  Uterine rupture (very rare). When induction is needed for medical reasons, the benefits of induction may outweigh the risks. What are some reasons for not inducing labor? Labor induction should not be done if:  It is shown that your baby does not tolerate labor.  You have had previous surgeries on your uterus, such as a myomectomy or the removal of  fibroids.  Your placenta lies very low in the uterus and blocks the opening of the cervix (placenta previa).  Your baby is not in a head-down position.  The umbilical cord drops down into the birth canal in front of the baby. This could cut off the baby's blood and oxygen supply.  You have had a previous cesarean delivery.  There are unusual circumstances, such as the baby being extremely premature. This information is not intended to replace advice given to you by your health care provider. Make sure you discuss any questions you have with your health care provider. Document Released: 02/14/2007 Document Revised: 03/02/2016 Document Reviewed: 04/24/2013 Elsevier Interactive Patient Education  2017 Elsevier Inc.  

## 2018-08-19 ENCOUNTER — Encounter (HOSPITAL_COMMUNITY): Payer: Self-pay | Admitting: *Deleted

## 2018-08-19 ENCOUNTER — Telehealth (HOSPITAL_COMMUNITY): Payer: Self-pay | Admitting: *Deleted

## 2018-08-19 NOTE — Telephone Encounter (Signed)
Preadmission screen  

## 2018-08-22 ENCOUNTER — Ambulatory Visit (HOSPITAL_COMMUNITY)
Admission: RE | Admit: 2018-08-22 | Discharge: 2018-08-22 | Disposition: A | Discharge status: Home / Self Care | Payer: Medicaid Other | Source: Ambulatory Visit | Attending: Obstetrics & Gynecology | Admitting: Obstetrics & Gynecology

## 2018-08-22 ENCOUNTER — Encounter: Payer: Medicaid Other | Admitting: Advanced Practice Midwife

## 2018-08-22 DIAGNOSIS — Z362 Encounter for other antenatal screening follow-up: Secondary | ICD-10-CM | POA: Insufficient documentation

## 2018-08-22 DIAGNOSIS — O9989 Other specified diseases and conditions complicating pregnancy, childbirth and the puerperium: Secondary | ICD-10-CM

## 2018-08-22 DIAGNOSIS — O88213 Thromboembolism in pregnancy, third trimester: Secondary | ICD-10-CM | POA: Diagnosis not present

## 2018-08-22 DIAGNOSIS — J45909 Unspecified asthma, uncomplicated: Secondary | ICD-10-CM | POA: Insufficient documentation

## 2018-08-22 DIAGNOSIS — O99513 Diseases of the respiratory system complicating pregnancy, third trimester: Secondary | ICD-10-CM | POA: Diagnosis not present

## 2018-08-22 DIAGNOSIS — Z3A38 38 weeks gestation of pregnancy: Secondary | ICD-10-CM | POA: Diagnosis not present

## 2018-08-22 DIAGNOSIS — O3663X0 Maternal care for excessive fetal growth, third trimester, not applicable or unspecified: Secondary | ICD-10-CM

## 2018-08-26 ENCOUNTER — Ambulatory Visit (INDEPENDENT_AMBULATORY_CARE_PROVIDER_SITE_OTHER): Payer: Medicaid Other | Admitting: Obstetrics & Gynecology

## 2018-08-26 VITALS — BP 116/77 | HR 90 | Wt 162.0 lb

## 2018-08-26 DIAGNOSIS — O0990 Supervision of high risk pregnancy, unspecified, unspecified trimester: Secondary | ICD-10-CM

## 2018-08-26 DIAGNOSIS — O0993 Supervision of high risk pregnancy, unspecified, third trimester: Secondary | ICD-10-CM

## 2018-08-26 DIAGNOSIS — Z3A38 38 weeks gestation of pregnancy: Secondary | ICD-10-CM

## 2018-08-26 NOTE — Patient Instructions (Addendum)
Labor Induction Labor induction is when steps are taken to cause a pregnant woman to begin the labor process. Most women go into labor on their own between 37 weeks and 42 weeks of the pregnancy. When this does not happen or when there is a medical need, methods may be used to induce labor. Labor induction causes a pregnant woman's uterus to contract. It also causes the cervix to soften (ripen), open (dilate), and thin out (efface). Usually, labor is not induced before 39 weeks of the pregnancy unless there is a problem with the baby or mother. Before inducing labor, your health care provider will consider a number of factors, including the following:  The medical condition of you and the baby.  How many weeks along you are.  The status of the baby's lung maturity.  The condition of the cervix.  The position of the baby.  What are the reasons for labor induction? Labor may be induced for the following reasons:  The health of the baby or mother is at risk.  The pregnancy is overdue by 1 week or more.  The water breaks but labor does not start on its own.  The mother has a health condition or serious illness, such as high blood pressure, infection, placental abruption, or diabetes.  The amniotic fluid amounts are low around the baby.  The baby is distressed.  Convenience or wanting the baby to be born on a certain date is not a reason for inducing labor. What methods are used for labor induction? Several methods of labor induction may be used, such as:  Prostaglandin medicine. This medicine causes the cervix to dilate and ripen. The medicine will also start contractions. It can be taken by mouth or by inserting a suppository into the vagina.  Inserting a thin tube (catheter) with a balloon on the end into the vagina to dilate the cervix. Once inserted, the balloon is expanded with water, which causes the cervix to open.  Stripping the membranes. Your health care provider separates  amniotic sac tissue from the cervix, causing the cervix to be stretched and causing the release of a hormone called progesterone. This may cause the uterus to contract. It is often done during an office visit. You will be sent home to wait for the contractions to begin. You will then come in for an induction.  Breaking the water. Your health care provider makes a hole in the amniotic sac using a small instrument. Once the amniotic sac breaks, contractions should begin. This may still take hours to see an effect.  Medicine to trigger or strengthen contractions. This medicine is given through an IV access tube inserted into a vein in your arm.  All of the methods of induction, besides stripping the membranes, will be done in the hospital. Induction is done in the hospital so that you and the baby can be carefully monitored. How long does it take for labor to be induced? Some inductions can take up to 2-3 days. Depending on the cervix, it usually takes less time. It takes longer when you are induced early in the pregnancy or if this is your first pregnancy. If a mother is still pregnant and the induction has been going on for 2-3 days, either the mother will be sent home or a cesarean delivery will be needed. What are the risks associated with labor induction? Some of the risks of induction include:  Changes in fetal heart rate, such as too high, too low, or erratic.    Fetal distress.  Chance of infection for the mother and baby.  Increased chance of having a cesarean delivery.  Breaking off (abruption) of the placenta from the uterus (rare).  Uterine rupture (very rare).  When induction is needed for medical reasons, the benefits of induction may outweigh the risks. What are some reasons for not inducing labor? Labor induction should not be done if:  It is shown that your baby does not tolerate labor.  You have had previous surgeries on your uterus, such as a myomectomy or the removal of  fibroids.  Your placenta lies very low in the uterus and blocks the opening of the cervix (placenta previa).  Your baby is not in a head-down position.  The umbilical cord drops down into the birth canal in front of the baby. This could cut off the baby's blood and oxygen supply.  You have had a previous cesarean delivery.  There are unusual circumstances, such as the baby being extremely premature.  This information is not intended to replace advice given to you by your health care provider. Make sure you discuss any questions you have with your health care provider. Document Released: 02/14/2007 Document Revised: 03/02/2016 Document Reviewed: 04/24/2013 Elsevier Interactive Patient Education  2017 Elsevier [employer].   AREA PEDIATRIC/FAMILY PRACTICE PHYSICIANS  [facility] [address] [place], [place]  [address] Phone - [phone]   Fax - [phone]  [facility] [address] [place], [place] [address] Phone - [phone]   Fax - [phone]  [facility] [address] [place], [place]  [address] Phone - [phone]   Fax - [phone]  [facility] [address] [place], [place]  [address] Phone - [phone]   Fax - [phone]  [facility] [address] [place], [place]  [address] Phone - [phone]   Fax - [phone]  [facility] [address] [place], [place]  [address] Phone - [phone]   Fax - [phone]  [facility] [address] [place], [place]  [address] Phone - [phone]   Fax - [phone]  [facility] [address] [place], [address] [place], [place]  [address] Phone - [phone]   Fax - [phone]  [facility] AT [facility] [address] [place], [place]  [address] Phone - [phone]   Fax - [phone] [facility] AT  [place] [address] [place], [place]  [address] Phone - [phone]   Fax - [phone]  [facility] [address] [place], [place]  [address] Phone - [phone]   Fax - [phone]  [facility] [address], [address] [place], [place]  [address] Phone - [phone]   Fax - [phone]  [facility] [address] [place], [place]  [address] Phone - [phone]   Fax - [phone]  [facility] [address] [place], [place]  [address] Phone - [phone]  [facility] [address] [place], [place]  [address] Phone - [phone]   Fax - [phone]  [facility] [address] [place], [place]  [address] Phone - [phone]   Fax - [phone]  [facility] [address] [place], [place]  [address] Phone - [phone]   Fax - [phone]  [facility] [address] [place], [place]  [address] Phone - [phone]   Fax - [phone]   [facility] [address] [place], [place]  [address] Phone - [phone]   Fax - [phone]  [facility] [address] [place], [place]  [address] Phone - [phone]   Fax - [phone]  [facility] RUBIN [address] [place], [place]  [address] Phone - [phone]   Fax - [phone]  [facility] [address] [place], [place]  [address] Phone - [phone]   Fax - [phone]  [facility] [address] [place], [place]  [address] Phone - [phone]   Fax - [phone] [facility] [place] [address] [place], [place]  [address] Phone - [phone]   Fax - [phone]  [facility] [place] [address] [place], [place]  [address] Phone - [phone]   Fax - [phone]  [facility] [place] [address] [place], [place]  [address] Phone - [phone]   Fax - [phone]  [facility] [place] Wyvonne Lenzharlene Flemming MD [address] [place] [address] Phone [phone]  Fax [phone]

## 2018-08-26 NOTE — Progress Notes (Signed)
   PRENATAL VISIT NOTE  Subjective:  Carmen [name] is a 27 y.o. G1P0 at 4642w6d being seen today for ongoing prenatal care.  She is currently monitored for the following issues for this high-risk pregnancy and has Pulmonary embolism, antepartum; Supervision of high-risk pregnancy; and Normocytic anemia on their problem list.  Patient reports no complaints. Denies leaking of fluid.   The following portions of the patient's history were reviewed and updated as appropriate: allergies, current medications, past family history, past medical history, past social history, past surgical history and problem list. Problem list updated.  Objective:  There were no vitals filed for this visit.  Fetal Status:           General:  Alert, oriented and cooperative. Patient is in no acute distress.  Skin: Skin is warm and dry. No rash noted.   Cardiovascular: Normal heart rate noted  Respiratory: Normal respiratory effort, no problems with respiration noted  Abdomen: Soft, gravid, appropriate for gestational age.        Pelvic: Cervical exam deferred        Extremities: Normal range of motion.     Mental Status: Normal mood and affect. Normal behavior. Normal judgment and thought content.   Assessment and Plan:  Pregnancy: G1P0 at 5542w6d  1. Pulmonary embolism, antepartum On Lovenox 60 mg daily. Plan for delivery at 39 weeks for anticoagulation coordination. Prior to delivery, anticoagulation will be managed as follows:  Stop Lovenox 24 hours prior to cesarean/induction (Patient successfully         stopped 24hrs prior)       Will check aPTT on admission        Intrapartum, will allow ambulation and use SCDs. Postpartum anticoagulation plan is: continue Lovenox for 6 weeks Patient instructed to hold anticoagulation for regular painful contractions requiring evaluation or for SROM. - enoxaparin (LOVENOX) 60 MG/0.6ML injection; Inject 0.6 mLs (60 mg total) into the skin daily.  Dispense: 36 mL;  Refill: 3  2. Large for dates affecting management of mother, third trimester, not applicable or unspecified fetus Appropriate fetal growth (EFW 58%tile) on 11/14 US  3. Supervision of high risk pregnancy in third trimester Term labor symptoms and general obstetric precautions including but not limited to vaginal bleeding, contractions, leaking of fluid and fetal movement were reviewed in detail with the patient.  Please refer to After Visit Summary for other counseling recommendations.   Patient will present tomorrow morning at 7am for induction of labor.   Future Appointments  Date Time Provider Department Center  08/27/2018  7:00 AM [facility] ROOM [facility] None  09/30/2018 10:15 AM [facility] LAB 4 [facility] None  09/30/2018 10:40 AM Kale, Corene CorneaGautam Kishore, MD [facility] None    Adella HareSean J Jaidan Prevette, Medical Student

## 2018-08-26 NOTE — Progress Notes (Signed)
Stopped lovenox 08/22/18 as instructed.

## 2018-08-27 ENCOUNTER — Encounter (HOSPITAL_COMMUNITY): Payer: Self-pay

## 2018-08-27 ENCOUNTER — Inpatient Hospital Stay (HOSPITAL_COMMUNITY)
Admission: RE | Admit: 2018-08-27 | Discharge: 2018-08-31 | DRG: 807 | Disposition: A | Discharge status: Home / Self Care | Payer: Medicaid Other | Attending: Obstetrics and Gynecology | Admitting: Obstetrics and Gynecology

## 2018-08-27 DIAGNOSIS — Z87891 Personal history of nicotine dependence: Secondary | ICD-10-CM | POA: Diagnosis not present

## 2018-08-27 DIAGNOSIS — Z86711 Personal history of pulmonary embolism: Secondary | ICD-10-CM

## 2018-08-27 DIAGNOSIS — O9902 Anemia complicating childbirth: Secondary | ICD-10-CM | POA: Diagnosis present

## 2018-08-27 DIAGNOSIS — D649 Anemia, unspecified: Secondary | ICD-10-CM | POA: Diagnosis present

## 2018-08-27 DIAGNOSIS — Z8759 Personal history of other complications of pregnancy, childbirth and the puerperium: Secondary | ICD-10-CM | POA: Diagnosis present

## 2018-08-27 DIAGNOSIS — O88219 Thromboembolism in pregnancy, unspecified trimester: Secondary | ICD-10-CM | POA: Diagnosis present

## 2018-08-27 DIAGNOSIS — O0990 Supervision of high risk pregnancy, unspecified, unspecified trimester: Secondary | ICD-10-CM

## 2018-08-27 DIAGNOSIS — Z3A39 39 weeks gestation of pregnancy: Secondary | ICD-10-CM

## 2018-08-27 DIAGNOSIS — O26893 Other specified pregnancy related conditions, third trimester: Secondary | ICD-10-CM | POA: Diagnosis present

## 2018-08-27 LAB — RPR: RPR: NONREACTIVE

## 2018-08-27 LAB — CBC
HCT: 29.1 % — ABNORMAL LOW (ref 36.0–46.0)
HEMOGLOBIN: 8.8 g/dL — AB (ref 12.0–15.0)
MCH: 24.4 pg — ABNORMAL LOW (ref 26.0–34.0)
MCHC: 30.2 g/dL (ref 30.0–36.0)
MCV: 80.6 fL (ref 80.0–100.0)
PLATELETS: 176 10*3/uL (ref 150–400)
RBC: 3.61 MIL/uL — AB (ref 3.87–5.11)
RDW: 17.1 % — ABNORMAL HIGH (ref 11.5–15.5)
WBC: 9.3 10*3/uL (ref 4.0–10.5)
nRBC: 0 % (ref 0.0–0.2)

## 2018-08-27 LAB — PROTIME-INR
INR: 0.9
PROTHROMBIN TIME: 12.1 s (ref 11.4–15.2)

## 2018-08-27 LAB — ABO/RH: ABO/RH(D): O POS

## 2018-08-27 LAB — APTT: aPTT: 26 seconds (ref 24–36)

## 2018-08-27 MED ORDER — MISOPROSTOL 25 MCG QUARTER TABLET
25.0000 ug | ORAL_TABLET | ORAL | Status: DC | PRN
  Administered 2018-08-27: 25 ug via VAGINAL
  Filled 2018-08-27 (×2): qty 1

## 2018-08-27 MED ORDER — ACETAMINOPHEN 325 MG PO TABS: 650.0000 mg | ORAL_TABLET | ORAL | Status: DC | PRN

## 2018-08-27 MED ORDER — LACTATED RINGERS IV SOLN
500.0000 mL | INTRAVENOUS | Status: DC | PRN
  Administered 2018-08-28: 500 mL via INTRAVENOUS

## 2018-08-27 MED ORDER — HYDROXYZINE HCL 50 MG PO TABS
50.0000 mg | ORAL_TABLET | Freq: Four times a day (QID) | ORAL | Status: DC | PRN
  Filled 2018-08-27: qty 1

## 2018-08-27 MED ORDER — ZOLPIDEM TARTRATE 5 MG PO TABS: 5.0000 mg | ORAL_TABLET | Freq: Every evening | ORAL | Status: DC | PRN

## 2018-08-27 MED ORDER — DIPHENHYDRAMINE HCL 50 MG/ML IJ SOLN: 12.5000 mg | INTRAMUSCULAR | Status: DC | PRN

## 2018-08-27 MED ORDER — EPHEDRINE 5 MG/ML INJ
10.0000 mg | INTRAVENOUS | Status: DC | PRN
  Filled 2018-08-27: qty 2

## 2018-08-27 MED ORDER — LACTATED RINGERS IV SOLN
500.0000 mL | Freq: Once | INTRAVENOUS | Status: AC
  Administered 2018-08-27: 125 mL via INTRAVENOUS

## 2018-08-27 MED ORDER — OXYTOCIN BOLUS FROM INFUSION
500.0000 mL | Freq: Once | INTRAVENOUS | Status: AC
  Administered 2018-08-29: 500 mL via INTRAVENOUS

## 2018-08-27 MED ORDER — MISOPROSTOL 50MCG HALF TABLET
50.0000 ug | ORAL_TABLET | ORAL | Status: DC | PRN
  Administered 2018-08-27: 50 ug via ORAL
  Filled 2018-08-27 (×2): qty 1

## 2018-08-27 MED ORDER — SOD CITRATE-CITRIC ACID 500-334 MG/5ML PO SOLN: 30.0000 mL | ORAL | Status: DC | PRN

## 2018-08-27 MED ORDER — MISOPROSTOL 50MCG HALF TABLET: 50.0000 ug | ORAL_TABLET | ORAL | Status: DC | PRN

## 2018-08-27 MED ORDER — LACTATED RINGERS IV SOLN
INTRAVENOUS | Status: DC
  Administered 2018-08-27 – 2018-08-29 (×7): via INTRAVENOUS

## 2018-08-27 MED ORDER — LIDOCAINE HCL (PF) 1 % IJ SOLN
30.0000 mL | INTRAMUSCULAR | Status: AC | PRN
  Administered 2018-08-29: 30 mL via SUBCUTANEOUS
  Filled 2018-08-27: qty 30

## 2018-08-27 MED ORDER — FENTANYL CITRATE (PF) 100 MCG/2ML IJ SOLN
50.0000 ug | INTRAMUSCULAR | Status: DC | PRN
  Administered 2018-08-27 – 2018-08-28 (×6): 100 ug via INTRAVENOUS
  Filled 2018-08-27 (×6): qty 2

## 2018-08-27 MED ORDER — TERBUTALINE SULFATE 1 MG/ML IJ SOLN
0.2500 mg | Freq: Once | INTRAMUSCULAR | Status: DC | PRN
  Filled 2018-08-27: qty 1

## 2018-08-27 MED ORDER — OXYTOCIN 40 UNITS IN LACTATED RINGERS INFUSION - SIMPLE MED: 1.0000 m[IU]/min | INTRAVENOUS | Status: DC

## 2018-08-27 MED ORDER — PHENYLEPHRINE 40 MCG/ML (10ML) SYRINGE FOR IV PUSH (FOR BLOOD PRESSURE SUPPORT)
80.0000 ug | PREFILLED_SYRINGE | INTRAVENOUS | Status: DC | PRN
  Filled 2018-08-27 (×2): qty 10
  Filled 2018-08-27: qty 5

## 2018-08-27 MED ORDER — OXYTOCIN 40 UNITS IN LACTATED RINGERS INFUSION - SIMPLE MED
1.0000 m[IU]/min | INTRAVENOUS | Status: DC
  Administered 2018-08-27: 1 m[IU]/min via INTRAVENOUS
  Filled 2018-08-27: qty 1000

## 2018-08-27 MED ORDER — ONDANSETRON HCL 4 MG/2ML IJ SOLN
4.0000 mg | Freq: Four times a day (QID) | INTRAMUSCULAR | Status: DC | PRN
  Administered 2018-08-28: 4 mg via INTRAVENOUS
  Filled 2018-08-27: qty 2

## 2018-08-27 MED ORDER — PHENYLEPHRINE 40 MCG/ML (10ML) SYRINGE FOR IV PUSH (FOR BLOOD PRESSURE SUPPORT)
80.0000 ug | PREFILLED_SYRINGE | INTRAVENOUS | Status: DC | PRN
  Filled 2018-08-27: qty 10
  Filled 2018-08-27: qty 5

## 2018-08-27 MED ORDER — OXYCODONE-ACETAMINOPHEN 5-325 MG PO TABS: 2.0000 | ORAL_TABLET | ORAL | Status: DC | PRN

## 2018-08-27 MED ORDER — FLEET ENEMA 7-19 GM/118ML RE ENEM: 1.0000 | ENEMA | Freq: Every day | RECTAL | Status: DC | PRN

## 2018-08-27 MED ORDER — OXYCODONE-ACETAMINOPHEN 5-325 MG PO TABS: 1.0000 | ORAL_TABLET | ORAL | Status: DC | PRN

## 2018-08-27 MED ORDER — OXYTOCIN 40 UNITS IN LACTATED RINGERS INFUSION - SIMPLE MED
2.5000 [IU]/h | INTRAVENOUS | Status: DC
  Filled 2018-08-27: qty 1000

## 2018-08-27 MED ORDER — FENTANYL 2.5 MCG/ML BUPIVACAINE 1/10 % EPIDURAL INFUSION (WH - ANES)
14.0000 mL/h | INTRAMUSCULAR | Status: DC | PRN
  Administered 2018-08-28: 14 mL/h via EPIDURAL
  Administered 2018-08-28: 12 mL/h via EPIDURAL
  Administered 2018-08-29 (×2): 14 mL/h via EPIDURAL
  Filled 2018-08-27 (×5): qty 100

## 2018-08-27 NOTE — Progress Notes (Signed)
LABOR PROGRESS NOTE  Carmen [name] is a 27 y.o. G1P0000 at 2259w0d  admitted for IOL for hx of PE on lovenox  Subjective: Feeling pain with contractions  Objective: BP 120/81   Pulse 99   LMP 11/27/2017 (Exact Date) Comment: pt shielded or  Vitals:   08/27/18 0745  BP: 120/81  Pulse: 99     Dilation: Closed Effacement (%): Thick Cervical Position: Posterior Station: -3 Presentation: Vertex Exam by:: dr Nicie Milan FHT: baseline rate 130s, moderate varibility, + acel, no decels Toco: regular contractions  Labs: Lab Results  Component Value Date   WBC 9.3 08/27/2018   HGB 8.8 (L) 08/27/2018   HCT 29.1 (L) 08/27/2018   MCV 80.6 08/27/2018   PLT 176 08/27/2018    Patient Active Problem List   Diagnosis Date Noted  . Maternal pulmonary embolus (PE), current pregnancy 08/27/2018  . Normocytic anemia 06/26/2018  . Supervision of high-risk pregnancy 04/10/2018  . Pulmonary embolism, antepartum 02/08/2018    Assessment / Plan: 27 y.o. G1P0000 at 3859w0d here for IOL for PE on lovenox  Labor: Cook's placed using speculum and ring forcep with some bloody show after procedure Fetal Wellbeing:  Cat 1 strip Pain Control:  Would like to avoid epidural Anticipated MOD:  SVD  Carmen AbbotNimeka Mineola Duan, MD OB Fellow  08/27/2018, 1:08 PM

## 2018-08-27 NOTE — H&P (Signed)
LABOR AND DELIVERY ADMISSION HISTORY AND PHYSICAL NOTE  Carmen [name] is a 27 y.o. female G1P0000 with IUP at 5589w0d by LMP=10w US presenting for IOL for hx of PE on lovenox. Last dose of lovenox was Thursday 08/22/18.  She reports positive fetal movement. She denies leakage of fluid or vaginal bleeding.  Pren[facility] History/Complications: [facility] Pregnancy complications:  - hx of PE on lovenox  Past Medical History: Past Medical History:  Diagnosis Date  . Anemia   . Asthma   . Panic attacks   . Pulmonary embolism (HCC) 02/08/2018    Past Surgical History: Past Surgical History:  Procedure Laterality Date  . NO PAST SURGERIES      Obstetrical History: OB History    Gravida  1   Para  0   Term  0   Preterm  0   AB  0   Living  0     SAB  0   TAB  0   Ectopic  0   Multiple  0   Live Births  0           Social History: Social History   Socioeconomic History  . Marital status: Single    Spouse name: Not on file  . Number of children: Not on file  . Years of education: Not on file  . Highest education level: Not on file  Occupational History  . Not on file  Social Needs  . Financial resource strain: Somewhat hard  . Food insecurity:    Worry: Often true    Inability: Not on file  . Transportation needs:    Medical: No    Non-medical: Not on file  Tobacco Use  . Smoking status: Former Smoker    Types: Cigarettes  . Smokeless tobacco: Never Used  Substance and Sexual Activity  . Alcohol use: Not Currently  . Drug use: No  . Sexual activity: Yes  Lifestyle  . Physical activity:    Days per week: Not on file    Minutes per session: Not on file  . Stress: Only a little  Relationships  . Social connections:    Talks on phone: Not on file    Gets together: Not on file    Attends religious service: Not on file    Active member of club or organization: Not on file    Attends meetings of clubs or organizations: Not on file    Relationship  status: Not on file  Other Topics Concern  . Not on file  Social History Narrative  . Not on file    Family History: Family History  Problem Relation Age of Onset  . Pulmonary embolism Mother   . Ulcers Sister     Allergies: Allergies  Allergen Reactions  . Morphine And Related Anaphylaxis    Medications Prior to Admission  Medication Sig Dispense Refill Last Dose  . docusate sodium (COLACE) 100 MG capsule Take by mouth.   Taking  . enoxaparin (LOVENOX) 60 MG/0.6ML injection Inject 0.6 mLs (60 mg total) into the skin daily. (Patient not taking: Reported on 08/26/2018) 36 mL 3 Not Taking  . famotidine (PEPCID) 20 MG tablet Take 1 tablet (20 mg total) by mouth 2 (two) times daily. (Patient not taking: Reported on 07/24/2018) 60 tablet 1 Not Taking  . ferrous sulfate 325 (65 FE) MG tablet Take 325 mg by mouth daily with breakfast.   Taking  . hydrocortisone 2.5 % cream Apply topically 2 (two) times daily. (  Patient not taking: Reported on 07/24/2018) 30 g 2 Not Taking  . iron polysaccharides (NIFEREX) 150 MG capsule Take 1 capsule (150 mg total) by mouth 2 (two) times daily. 60 capsule 3 Taking  . Prenatal Vit-Fe Phos-FA-Omega (VITAFOL GUMMIES) 3.33-0.333-34.8 MG CHEW Chew 3 each by mouth daily. (Patient not taking: Reported on 08/26/2018) 90 tablet 11 Not Taking     Review of Systems  All systems reviewed and negative except as stated in HPI  Physical Exam Blood pressure 120/81, pulse 99, last menstrual period 11/27/2017. General appearance: alert, oriented, NAD Lungs: normal respiratory effort Heart: regular rate Abdomen: soft, non-tender; gravid, FH appropriate for GA Extremities: No calf swelling or tenderness Presentation: cephalic Fetal monitoring: baseline 130-40s/mod variability/+ acels/no decels Uterine activity: irregular contractions    Prenatal labs: ABO, Rh: --/--/O POS (11/19 0745) Antibody: NEG (11/19 0745) Rubella: Immune (04/25 0000) RPR: Non Reactive  (08/30 0905)  HBsAg:   Negative HIV: Non Reactive (08/30 0905)  GC/Chlamydia: negative GBS: Negative (11/05 0000)  2-hr GTT: normal (72/130/104) Genetic screening:  Quad: negative Anatomy US: Echogenic focus in LV  Prenatal Transfer Tool  Maternal Diabetes: No Genetic Screening: Normal Maternal Ultrasounds/Referrals: Normal Fetal Ultrasounds or other Referrals:  Other:  Anatomy scan with echogenic focus in LV Maternal Substance Abuse:  No Significant Maternal Medications:  Meds include: Other: lovenox Significant Maternal Lab Results: None  Results for orders placed or performed during the hospital encounter of 08/27/18 (from the past 24 hour(s))  APTT   Collection Time: 08/27/18  7:45 AM  Result Value Ref Range   aPTT 26 24 - 36 seconds  Protime-INR   Collection Time: 08/27/18  7:45 AM  Result Value Ref Range   Prothrombin Time 12.1 11.4 - 15.2 seconds   INR 0.90   CBC   Collection Time: 08/27/18  7:45 AM  Result Value Ref Range   WBC 9.3 4.0 - 10.5 K/uL   RBC 3.61 (L) 3.87 - 5.11 MIL/uL   Hemoglobin 8.8 (L) 12.0 - 15.0 g/dL   HCT 16.1 (L) 09.6 - 04.5 %   MCV 80.6 80.0 - 100.0 fL   MCH 24.4 (L) 26.0 - 34.0 pg   MCHC 30.2 30.0 - 36.0 g/dL   RDW 40.9 (H) 81.1 - 91.4 %   Platelets 176 150 - 400 K/uL   nRBC 0.0 0.0 - 0.2 %  Type and screen   Collection Time: 08/27/18  7:45 AM  Result Value Ref Range   ABO/RH(D) O POS    Antibody Screen NEG    Sample Expiration      08/30/2018 Performed at [facility], [address], [place], [place] [address]     Patient Active Problem List   Diagnosis Date Noted  . Maternal pulmonary embolus (PE), current pregnancy 08/27/2018  . Normocytic anemia 06/26/2018  . Supervision of high-risk pregnancy 04/10/2018  . Pulmonary embolism, antepartum 02/08/2018    Assessment: Carmen [name] is a 27 y.o. G1P0000 at [redacted]w[redacted]d here for IOL for hx PE on lovenox  #Labor: Start induction with cytotec. Will place FB when able.   #Pain: Desires IV pain meds and nitrous oxide. Hopes to avoid epidural. #FWB: Cat 1 strip #ID: GBS neg #MOF: Breast #MOC: undecided #Circ:  NA  Gwenevere Abbot, MD  Ob Fellow 08/27/2018, 9:07 AM

## 2018-08-27 NOTE — Progress Notes (Addendum)
LABOR PROGRESS NOTE  Carmen [name] is a 27 y.o. G1P0000 at 1347w0d   Subjective: Patient appears comfortable and is resting in bed.  Presented to patient's room to confirm presentation via US.  Objective: BP 123/75 (BP Location: Left Arm)   Pulse 99   Temp 98.4 F (36.9 C) (Oral)   Resp 16   LMP 11/27/2017 (Exact Date) Comment: pt shielded or  Vitals:   08/27/18 0745 08/27/18 1413  BP: 120/81 123/75  Pulse: 99 99  Resp: 16   Temp: 98 F (36.7 C) 98.4 F (36.9 C)  TempSrc:  Oral     Dilation: Closed Effacement (%): Thick Cervical Position: Posterior Station: -3 Presentation: Vertex Exam by:: dr Tiffaney Heimann FHT: baseline rate 140, moderate varibility, occasional acel, no decel Toco: irregular contraction pattern  Labs: Lab Results  Component Value Date   WBC 9.3 08/27/2018   HGB 8.8 (L) 08/27/2018   HCT 29.1 (L) 08/27/2018   MCV 80.6 08/27/2018   PLT 176 08/27/2018    Patient Active Problem List   Diagnosis Date Noted  . Maternal pulmonary embolus (PE), current pregnancy 08/27/2018  . Normocytic anemia 06/26/2018  . Supervision of high-risk pregnancy 04/10/2018  . Pulmonary embolism, antepartum 02/08/2018    Assessment / Plan: 27 y.o. G1P0000 at 2647w0d here for induction of labor Monitor cervical changes q4hr PO cytotec until >4cm IV pitocin   Labor: 2nd dose of cytotec. Foley bulb still in place. Fetal Wellbeing:  Category I Pain Control:  Fentanyl q1hr PRN Anticipated MOD:  vaginal  Jamelle Rushinghelsey Anderson DO PGY-1 FM resident  08/27/2018, 2:47 PM   OB FELLOW LABOR PROGRESS NOTE ATTESTATION  I have seen and examined this patient and agree with above documentation in the resident's note.   Gwenevere AbbotNimeka Delphine Sizemore, MD OB Fellow  08/27/2018, 11:09 PM

## 2018-08-27 NOTE — Progress Notes (Signed)
LABOR PROGRESS NOTE  Carmen [name] is a 27 y.o. G1P0000 at 5055w0d  admitted for IOL for hx of PE on lovenox  Subjective: Feeling pain with contractions, has not required pain medication   Objective: BP 121/70   Pulse 96   Temp 98.1 F (36.7 C) (Oral)   Resp 16   LMP 11/27/2017 (Exact Date) Comment: pt shielded or  Vitals:   08/27/18 0745 08/27/18 1413 08/27/18 1734 08/27/18 1850  BP: 120/81 123/75 120/80 121/70  Pulse: 99 99 89 96  Resp: 16     Temp: 98 F (36.7 C) 98.4 F (36.9 C) 98.2 F (36.8 C) 98.1 F (36.7 C)  TempSrc:  Oral Oral Oral    Dilation: Closed Effacement (%): Thick Cervical Position: Posterior Station: -3 Presentation: Vertex Exam by:: dr Makinley Muscato FHT: baseline rate 140s, moderate varibility, + acel, no decel Toco: regular contractions q2-29m  Labs: Lab Results  Component Value Date   WBC 9.3 08/27/2018   HGB 8.8 (L) 08/27/2018   HCT 29.1 (L) 08/27/2018   MCV 80.6 08/27/2018   PLT 176 08/27/2018    Patient Active Problem List   Diagnosis Date Noted  . Maternal pulmonary embolus (PE), current pregnancy 08/27/2018  . Normocytic anemia 06/26/2018  . Supervision of high-risk pregnancy 04/10/2018  . Pulmonary embolism, antepartum 02/08/2018    Assessment / Plan: 27 y.o. G1P0000 at 2955w0d here for IOL for hx of PE on lovenox  Labor: hold cytotec given frequent contractions; FB still in place Fetal Wellbeing:  Cat 1 strip Pain Control:  Comfortable at this moment; desires epidural later in labor Anticipated MOD:  SVD  Carmen AbbotNimeka Dafney Farler, MD OB Fellow  08/27/2018, 8:32 PM

## 2018-08-27 NOTE — Anesthesia Pain Management Evaluation Note (Signed)
  [facility]NA Pain Management Visit Note  Patient: Carmen [name], 27 y.o., female  "Hello I am a [facility] anesthesia [facility]. We have an anesthesia team available at all times to provide care throughout the hospital, including epidural management and anesthesia for C-section. I don't know your plan for the delivery whether it a natural birth, water birth, IV sedation, nitrous supplementation, doula or epidural, but we want to meet your pain goals."   1.Was your pain managed to your expectations on prior hospitalizations?   No prior hospitalizations  2.What is your expectation for pain management during this hospitalization?     Epidural  3.How can we help you reach that goal? unsure  Record the patient's initial score and the patient's pain goal.   Pain: 3  Pain Goal: 5 [facility] wants you to be able to say your pain was always managed very well.  Cephus ShellingBURGER,Carmen [name] 08/27/2018

## 2018-08-28 ENCOUNTER — Other Ambulatory Visit: Payer: Self-pay

## 2018-08-28 ENCOUNTER — Inpatient Hospital Stay (HOSPITAL_COMMUNITY): Payer: Medicaid Other | Admitting: Anesthesiology

## 2018-08-28 MED ORDER — OXYTOCIN 40 UNITS IN LACTATED RINGERS INFUSION - SIMPLE MED: 1.0000 m[IU]/min | INTRAVENOUS | Status: DC

## 2018-08-28 MED ORDER — LIDOCAINE HCL (PF) 1 % IJ SOLN
INTRAMUSCULAR | Status: DC | PRN
  Administered 2018-08-28 (×2): 4 mL via EPIDURAL

## 2018-08-28 MED ORDER — LACTATED RINGERS IV SOLN
500.0000 mL | Freq: Once | INTRAVENOUS | Status: AC
  Administered 2018-08-29: 500 mL via INTRAVENOUS

## 2018-08-28 MED ORDER — DIPHENHYDRAMINE HCL 50 MG/ML IJ SOLN
12.5000 mg | Freq: Once | INTRAMUSCULAR | Status: AC
  Administered 2018-08-28: 12.5 mg via INTRAVENOUS
  Filled 2018-08-28: qty 1

## 2018-08-28 NOTE — Progress Notes (Signed)
LABOR PROGRESS NOTE  Carmen [name] is a 27 y.o. G1P0000 at 5654w1d  admitted for IOL due to h/o PE.   Subjective: Comfortable with epidural. Mother of patient at bedside.   Objective: BP 121/77   Pulse 96   Temp 98.1 F (36.7 C) (Oral)   Resp 18   Ht 4\' 11"  (1.499 m)   Wt 74.3 kg Comment: measured at admission  LMP 11/27/2017 (Exact Date) Comment: pt shielded  SpO2 92%   BMI 33.08 kg/m  or  Vitals:   08/28/18 1931 08/28/18 2001 08/28/18 2031 08/28/18 2101  BP: 117/74 120/83 115/69 121/77  Pulse: 95 (!) 104 91 96  Resp: 18 18  18   Temp:    98.1 F (36.7 C)  TempSrc:    Oral  SpO2:      Weight:      Height:        Dilation: 8.5 Effacement (%): 80 Cervical Position: Middle Station: 0 Presentation: Vertex Exam by:: Dr. Sheran Fava   FHT: baseline rate 150, moderate varibility, +acel, occ variable decel Toco: q2-3 min   Labs: Lab Results  Component Value Date   WBC 9.3 08/27/2018   HGB 8.8 (L) 08/27/2018   HCT 29.1 (L) 08/27/2018   MCV 80.6 08/27/2018   PLT 176 08/27/2018    Patient Active Problem List   Diagnosis Date Noted  . Maternal pulmonary embolus (PE), current pregnancy 08/27/2018  . Normocytic anemia 06/26/2018  . Supervision of high-risk pregnancy 04/10/2018  . Pulmonary embolism, antepartum 02/08/2018    Assessment / Plan: 27 y.o. G1P0000 at 4754w1d here for IOL for PE.   Labor: Patient with IUPC in place currently on Pitocin 15 mu/min. MVUs currently 130. Patient with periods of adequate ctx. Will continue to titrate Pitocin as needed. Has been on Pitocin for nearly 24h. Considered Pit break but given cervical change will continue with current plan. If no cervical change at next exam, will plan for 4h Pit break.  Fetal Wellbeing:  Cat II with overall reassuring FHT  Pain Control:  Epidural in place  Anticipated MOD:  NSVD   Marcy Sirenatherine , D.O. OB Fellow  08/28/2018, 9:28 PM

## 2018-08-28 NOTE — Anesthesia Procedure Notes (Signed)
Epidural Patient location during procedure: OB Start time: 08/28/2018 1:56 PM End time: 08/28/2018 1:59 PM  Staffing Anesthesiologist: Kaylyn LayerHowze, Ricahrd Schwager E, MD Performed: anesthesiologist   Preanesthetic Checklist Completed: patient identified, pre-op evaluation, timeout performed, IV checked, risks and benefits discussed and monitors and equipment checked  Epidural Patient position: sitting Prep: site prepped and draped and DuraPrep Patient monitoring: continuous pulse ox, blood pressure, heart rate and cardiac monitor Approach: midline Location: L3-L4 Injection technique: LOR air  Needle:  Needle type: Tuohy  Needle gauge: 17 G Needle length: 9 cm Needle insertion depth: 6 cm Catheter type: closed end flexible Catheter size: 19 Gauge Catheter at skin depth: 11 cm Test dose: negative and Other (1% lidocaine)  Assessment Events: blood not aspirated, injection not painful, no injection resistance, negative IV test and no paresthesia  Additional Notes Patient identified. Risks, benefits, and alternatives discussed with patient including but not limited to bleeding, infection, nerve damage, paralysis, failed block, incomplete pain control, headache, blood pressure changes, nausea, vomiting, reactions to medication, itching, and postpartum back pain. Confirmed with bedside nurse the patient's most recent platelet count. Confirmed with patient that they are not currently taking any anticoagulation, have any bleeding history, or any family history of bleeding disorders. Patient expressed understanding and wished to proceed. All questions were answered. Sterile technique was used throughout the entire procedure. Please see nursing notes for vital signs. Crisp LOR on first pass. Test dose was given through epidural catheter and negative prior to continuing to dose epidural or start infusion. Warning signs of high block given to the patient including shortness of breath, tingling/numbness in  hands, complete motor block, or any concerning symptoms with instructions to call for help. Patient was given instructions on fall risk and not to get out of bed. All questions and concerns addressed with instructions to call with any issues or inadequate analgesia.  Reason for block:procedure for pain

## 2018-08-28 NOTE — Progress Notes (Signed)
LABOR PROGRESS NOTE  Carmen [name] is a 27 y.o. G1P0000 at 9047w1d admitted for IOL for PE on lovenox  Subjective: Feeling pain with contractions  Objective: BP 130/80   Pulse (!) 113   Temp 98.5 F (36.9 C) (Oral)   Resp 18   LMP 11/27/2017 (Exact Date) Comment: pt shielded or  Vitals:   08/28/18 0331 08/28/18 0400 08/28/18 0431 08/28/18 0501  BP: 118/71 120/79 123/72 130/80  Pulse: (!) 103 (!) 101 94 (!) 113  Resp: 18 18 19 18   Temp:      TempSrc:        Dilation: 6 Effacement (%): 50 Cervical Position: Posterior Station: -3 Presentation: Vertex Exam by:: Aneta MinsPhillip MD FHT: baseline rate 130s, moderate varibility, + acel, no decel Toco: regular contractions q2-2337m   Labs: Lab Results  Component Value Date   WBC 9.3 08/27/2018   HGB 8.8 (L) 08/27/2018   HCT 29.1 (L) 08/27/2018   MCV 80.6 08/27/2018   PLT 176 08/27/2018    Patient Active Problem List   Diagnosis Date Noted  . Maternal pulmonary embolus (PE), current pregnancy 08/27/2018  . Normocytic anemia 06/26/2018  . Supervision of high-risk pregnancy 04/10/2018  . Pulmonary embolism, antepartum 02/08/2018    Assessment / Plan: 27 y.o. G1P0000 at 4347w1d here for IOL for PE on lovenox  Labor: progressing well. Continue pitocin.  Fetal Wellbeing:  Cat 1 strip Pain Control:  Managing well. 1 dose of fentanyl now Anticipated MOD:  SVD  Carmen AbbotNimeka Ozetta Flatley, MD OB Fellow  08/28/2018, 6:14 AM

## 2018-08-28 NOTE — Progress Notes (Signed)
LABOR PROGRESS NOTE  Carmen [name] is a 27 y.o. G1P0000 at 6279w1d  admitted for IOL for PE   Subjective: Breathing well through contractions, tolerate pain well, does not want epidural   Objective: BP (!) 103/57   Pulse (!) 109   Temp 98.5 F (36.9 [name]) (Oral)   Resp 18   Ht 4\' 11"  (1.499 m)   Wt 74.3 kg Comment: measured at admission  LMP 11/27/2017 (Exact Date) Comment: pt shielded  BMI 33.08 kg/m  or  Vitals:   08/28/18 0722 08/28/18 0802 08/28/18 0831 08/28/18 0901  BP:  139/87 118/61 (!) 103/57  Pulse:  (!) 105 (!) 103 (!) 109  Resp:  18 18 18   Temp: 98.5 F (36.9 [name])     TempSrc: Oral     Weight:    74.3 kg  Height:    4\' 11"  (1.499 m)    Cervix swollen, benadryl 12.5mg  IV ordered for swollen cervix, pitocin currently on 477milli-unit/min  Dilation: 6 Effacement (%): (swollen) Cervical Position: Middle Station: 0 Presentation: Vertex Exam by:: Carmen [name], cnm FHT: baseline rate 140, moderate varibility, +accel, no decel Toco: 3-4/ moderate by palpation   Labs: Lab Results  Component Value Date   WBC 9.3 08/27/2018   HGB 8.8 (L) 08/27/2018   HCT 29.1 (L) 08/27/2018   MCV 80.6 08/27/2018   PLT 176 08/27/2018    Patient Active Problem List   Diagnosis Date Noted  . Maternal pulmonary embolus (PE), current pregnancy 08/27/2018  . Normocytic anemia 06/26/2018  . Supervision of high-risk pregnancy 04/10/2018  . Pulmonary embolism, antepartum 02/08/2018    Assessment / Plan: 27 y.o. G1P0000 at 5079w1d here for IOL for PE  Labor: Cervix swollen, continue to titrate pitocin to active labor and benadryl IV  Fetal Wellbeing:  Cat I  Pain Control:  IV Fentanyl PRN  Anticipated MOD:  SVD  Sharyon CableRogers, Carmen [name], CNM 08/28/2018, 9:30 AM

## 2018-08-28 NOTE — Anesthesia Preprocedure Evaluation (Addendum)
Anesthesia Evaluation  Patient identified by MRN, date of birth, ID band Patient awake    Reviewed: Allergy & Precautions, Patient's Chart, lab work & pertinent test results  History of Anesthesia Complications Negative for: history of anesthetic complications  Airway Mallampati: II  TM Distance: >3 FB Neck ROM: Full    Dental no notable dental hx. (+) Teeth Intact   Pulmonary asthma , former smoker, PE (Hx of PE on lovenox, last dose 08/22/18)   Pulmonary exam normal breath sounds clear to auscultation       Cardiovascular negative cardio ROS Normal cardiovascular exam Rhythm:Regular Rate:Normal     Neuro/Psych Anxiety negative neurological ROS     GI/Hepatic negative GI ROS, Neg liver ROS,   Endo/Other  negative endocrine ROS  Renal/GU negative Renal ROS  negative genitourinary   Musculoskeletal negative musculoskeletal ROS (+)   Abdominal   Peds negative pediatric ROS (+)  Hematology  (+) Blood dyscrasia, anemia , Hgb 8.8 on 08/22/18   Anesthesia Other Findings   Reproductive/Obstetrics (+) Pregnancy                             Anesthesia Physical  Anesthesia Plan  ASA: II  Anesthesia Plan: Epidural   Post-op Pain Management:    Induction:   PONV Risk Score and Plan: 2 and Treatment may vary due to age or medical condition  Airway Management Planned: Natural Airway  Additional Equipment:   Intra-op Plan:   Post-operative Plan:   Informed Consent: I have reviewed the patients History and Physical, chart, labs and discussed the procedure including the risks, benefits and alternatives for the proposed anesthesia with the patient or authorized representative who has indicated his/her understanding and acceptance.       Plan Discussed with: CRNA  Anesthesia Plan Comments:         Anesthesia Quick Evaluation  

## 2018-08-28 NOTE — Progress Notes (Signed)
Carmen [name] is a 27 y.o. G1P0000 at 7874w1d by admitted for induction of labor due to history of PE.  Subjective: Called to room by FOB, saying patient "feels something is coming out." Patient on the toilet - quickly transferred to the bed. Not tolerating pain well.  Objective: BP (!) 100/53   Pulse 94   Temp 98.7 F (37.1 C) (Oral)   Resp 18   Ht 4\' 11"  (1.499 m)   Wt 74.3 kg Comment: measured at admission  LMP 11/27/2017 (Exact Date) Comment: pt shielded  BMI 33.08 kg/m  No intake/output data recorded.  FHT:  FHR: 135 bpm, variability: moderate,  accelerations:  Present,  decelerations:  Present (one variable, resolved when pt got off the toilet) UC:   regular, every 2-3 minutes SVE:   Dilation: 7 Effacement (%): 80 Station: -1 Exam by:: [place] Princ[place], CNM  Labs: Lab Results  Component Value Date   WBC 9.3 08/27/2018   HGB 8.8 (L) 08/27/2018   HCT 29.1 (L) 08/27/2018   MCV 80.6 08/27/2018   PLT 176 08/27/2018    Assessment / Plan: Induction of labor due to hx PE,  progressing well on pitocin  Requested epidural for pain relief  Labor: Current arrest of dilation, but pt is not tolerating contractions well and is unable to relax. Preeclampsia:  no signs or symptoms of toxicity Fetal Wellbeing:  Category I Pain Control:  IV pain meds, has requested epidural I/D:  n/a Anticipated MOD:  NSVD  Bernerd LimboJamilla R Willmer Fellers, SNM 08/28/2018, 1:51 PM

## 2018-08-28 NOTE — Progress Notes (Addendum)
Labor Progress Note  S: Patient seen & examined for progress of labor. Patient comfortable appearing without epidural, although reports her pain is worsening.   O: BP 117/76   Pulse (!) 108   Temp 98.1 F (36.7 C) (Oral)   Resp 20   LMP 11/27/2017 (Exact Date) Comment: pt shielded  FHT: 140bpm, mod var, +accels, no decels TOCO: q2-854min, patient looks comfortable during contractions  CVE: Dilation: 4.5 Effacement (%): 30 Cervical Position: Posterior Station: -3 Presentation: Vertex Exam by:: Mary [place]Jordan Johnson, RN   A&P: 27 y.o. G1P0000 6081w1d here for IOL for hx of PE on lovenox  Currently on 1 milli-units of pitocin Continue expectant management Anticipate SVD  [place] Shirley, DO FM Resident PGY-2 08/28/2018 12:08 AM   OB FELLOW LABOR PROGRESS NOTE ATTESTATION  I have seen and examined this patient and agree with above documentation in the resident's note.   Gwenevere AbbotNimeka Janelle Spellman, MD OB Fellow  08/28/2018, 6:23 AM

## 2018-08-28 NOTE — Progress Notes (Addendum)
Labor Progress Note  S: Patient seen & examined for progress of labor. Patient appears uncomfortable without epidural.   O: BP 114/75   Pulse 96   Temp 98.5 F (36.9 C) (Oral)   Resp 20   LMP 11/27/2017 (Exact Date) Comment: pt shielded  FHT: 135bpm, mod var, +accels, no decels TOCO: q2-774min, patient looks uncomfortable during contractions  CVE: Dilation: 4.5 Effacement (%): 30 Cervical Position: Posterior Station: -3 Presentation: Vertex Exam by:: Mary [place]Jordan Johnson, RN   A&P: 27 y.o. G1P0000 5971w1d here for IOL for hx of PE on lovenox   Currently on 2 milli-units of pitocin Continue current management Anticipate SVD  [place] Shirley, DO FM Resident PGY-2 08/28/2018 2:20 AM   OB FELLOW LABOR PROGRESS NOTE ATTESTATION  I have seen and examined this patient and agree with above documentation in the resident's note.   Gwenevere AbbotNimeka Declan Adamson, MD OB Fellow  08/28/2018, 6:22 AM

## 2018-08-28 NOTE — Progress Notes (Signed)
LABOR PROGRESS NOTE  Carmen [name] is a 27 y.o. G1P0000 at 1351w1d  admitted for IOL for PE  Subjective: Breathing through contractions, feel occasional pressure in bottom during contractions   Objective: BP 126/79   Pulse 92   Temp 98.4 F (36.9 C) (Oral)   Resp 18   Ht 4\' 11"  (1.499 m)   Wt 74.3 kg Comment: measured at admission  LMP 11/27/2017 (Exact Date) Comment: pt shielded  BMI 33.08 kg/m  or  Vitals:   08/28/18 1031 08/28/18 1101 08/28/18 1131 08/28/18 1201  BP: 109/60 (!) 130/43 124/79 126/79  Pulse: 97 (!) 104 (!) 111 92  Resp: 18     Temp:   98.4 F (36.9 C)   TempSrc:   Oral   Weight:      Height:        Pitocin currently at 637milli-unit/min, has not been titrated since 0600 Dilation: 7 Effacement (%): 80 Cervical Position: Middle Station: -1 Presentation: Vertex Exam by:: Marit Goodwill, cnm FHT: baseline rate 140, moderate varibility, +accel, no decel Toco: 2-3/moderate by palpation   Labs: Lab Results  Component Value Date   WBC 9.3 08/27/2018   HGB 8.8 (L) 08/27/2018   HCT 29.1 (L) 08/27/2018   MCV 80.6 08/27/2018   PLT 176 08/27/2018    Patient Active Problem List   Diagnosis Date Noted  . Maternal pulmonary embolus (PE), current pregnancy 08/27/2018  . Normocytic anemia 06/26/2018  . Supervision of high-risk pregnancy 04/10/2018  . Pulmonary embolism, antepartum 02/08/2018    Assessment / Plan: 27 y.o. G1P0000 at 2851w1d here for IOL for PE   Labor: Not progressing, encouraged to continue to titrate pitocin, AROM of forebag, IUPC if not cervical change in 2 hours  Fetal Wellbeing:  Cat I Pain Control:  IV Fentanyl  Anticipated MOD:  SVD  Sharyon CableRogers, Caysen Whang C, CNM 08/28/2018, 12:23 PM

## 2018-08-28 NOTE — Progress Notes (Signed)
Carmen [name] is a 27 y.o. G1P0000 at 5182w1d by admitted for induction of labor for hx of PE. Tolerated IUPC placement without complaint.  Subjective: Patient asleep in left lateral position, FOB asleep, and family at bedside. No complaints of pain.  Objective: BP (!) 132/108   Pulse 96   Temp 98.7 F (37.1 C) (Oral)   Resp 18   Ht 4\' 11"  (1.499 m)   Wt 74.3 kg Comment: measured at admission  LMP 11/27/2017 (Exact Date) Comment: pt shielded  SpO2 92%   BMI 33.08 kg/m  No intake/output data recorded.  FHT:  FHR: 140 bpm, variability: moderate,  accelerations:  Present,  decelerations:  Present one prolonged deceleration right after position change, resolved. UC:   regular, every 2-5 minutes SVE:   Dilation: 7 Effacement (%): 80 Station: -1 Exam by:: [place], CNM  Labs: Lab Results  Component Value Date   WBC 9.3 08/27/2018   HGB 8.8 (L) 08/27/2018   HCT 29.1 (L) 08/27/2018   MCV 80.6 08/27/2018   PLT 176 08/27/2018    Assessment / Plan: Induction of labor due to hx PE,  progressing well on pitocin  Labor: Progressing on Pitocin Preeclampsia:  no signs or symptoms of toxicity Fetal Wellbeing:  Category I Pain Control:  Epidural I/D:  n/a Anticipated MOD:  NSVD  Lyne Khurana R Saylor Murry,SNM 08/28/2018, 3:03 PM

## 2018-08-29 ENCOUNTER — Encounter (HOSPITAL_COMMUNITY): Payer: Self-pay

## 2018-08-29 ENCOUNTER — Encounter: Payer: Medicaid Other | Admitting: Family Medicine

## 2018-08-29 DIAGNOSIS — Z3A39 39 weeks gestation of pregnancy: Secondary | ICD-10-CM

## 2018-08-29 LAB — CBC
HEMATOCRIT: 22.2 % — AB (ref 36.0–46.0)
Hemoglobin: 7.1 g/dL — ABNORMAL LOW (ref 12.0–15.0)
MCH: 25.1 pg — ABNORMAL LOW (ref 26.0–34.0)
MCHC: 32 g/dL (ref 30.0–36.0)
MCV: 78.4 fL — ABNORMAL LOW (ref 80.0–100.0)
Platelets: 150 10*3/uL (ref 150–400)
RBC: 2.83 MIL/uL — ABNORMAL LOW (ref 3.87–5.11)
RDW: 17.2 % — AB (ref 11.5–15.5)
WBC: 24.5 10*3/uL — AB (ref 4.0–10.5)
nRBC: 0 % (ref 0.0–0.2)

## 2018-08-29 LAB — CREATININE, SERUM
CREATININE: 2.32 mg/dL — AB (ref 0.44–1.00)
GFR, EST AFRICAN AMERICAN: 32 mL/min — AB (ref >60)
GFR, EST NON AFRICAN AMERICAN: 27 mL/min — AB (ref >60)

## 2018-08-29 MED ORDER — METHYLERGONOVINE MALEATE 0.2 MG/ML IJ SOLN: 0.2000 mg | Freq: Once | INTRAMUSCULAR | Status: DC

## 2018-08-29 MED ORDER — OXYTOCIN 40 UNITS IN LACTATED RINGERS INFUSION - SIMPLE MED
1.0000 m[IU]/min | INTRAVENOUS | Status: DC
  Administered 2018-08-29: 4 m[IU]/min via INTRAVENOUS
  Administered 2018-08-29: 6 m[IU]/min via INTRAVENOUS
  Administered 2018-08-29: 2 m[IU]/min via INTRAVENOUS
  Administered 2018-08-29: 8 m[IU]/min via INTRAVENOUS
  Administered 2018-08-29: 10 m[IU]/min via INTRAVENOUS
  Filled 2018-08-29: qty 1000

## 2018-08-29 MED ORDER — ACETAMINOPHEN 325 MG PO TABS
650.0000 mg | ORAL_TABLET | ORAL | Status: DC | PRN
  Administered 2018-08-30 – 2018-08-31 (×2): 650 mg via ORAL
  Filled 2018-08-29 (×2): qty 2

## 2018-08-29 MED ORDER — PRENATAL MULTIVITAMIN CH
1.0000 | ORAL_TABLET | Freq: Every day | ORAL | Status: DC
  Administered 2018-08-31: 1 via ORAL
  Filled 2018-08-29 (×2): qty 1

## 2018-08-29 MED ORDER — IBUPROFEN 600 MG PO TABS
600.0000 mg | ORAL_TABLET | Freq: Four times a day (QID) | ORAL | Status: DC
  Administered 2018-08-29 – 2018-08-30 (×3): 600 mg via ORAL
  Filled 2018-08-29 (×3): qty 1

## 2018-08-29 MED ORDER — ENOXAPARIN SODIUM 40 MG/0.4ML ~~LOC~~ SOLN: 30.0000 mg | SUBCUTANEOUS | Status: DC

## 2018-08-29 MED ORDER — ONDANSETRON HCL 4 MG PO TABS: 4.0000 mg | ORAL_TABLET | ORAL | Status: DC | PRN

## 2018-08-29 MED ORDER — DOCUSATE SODIUM 100 MG PO CAPS
100.0000 mg | ORAL_CAPSULE | Freq: Two times a day (BID) | ORAL | Status: DC
  Administered 2018-08-30 – 2018-08-31 (×4): 100 mg via ORAL
  Filled 2018-08-29 (×4): qty 1

## 2018-08-29 MED ORDER — DIBUCAINE 1 % RE OINT: 1.0000 "application " | TOPICAL_OINTMENT | RECTAL | Status: DC | PRN

## 2018-08-29 MED ORDER — MISOPROSTOL 200 MCG PO TABS
ORAL_TABLET | ORAL | Status: AC
  Filled 2018-08-29: qty 4

## 2018-08-29 MED ORDER — TETANUS-DIPHTH-ACELL PERTUSSIS 5-2.5-18.5 LF-MCG/0.5 IM SUSP: 0.5000 mL | Freq: Once | INTRAMUSCULAR | Status: DC

## 2018-08-29 MED ORDER — ENOXAPARIN SODIUM 60 MG/0.6ML ~~LOC~~ SOLN: 60.0000 mg | Freq: Two times a day (BID) | SUBCUTANEOUS | Status: DC

## 2018-08-29 MED ORDER — METHYLERGONOVINE MALEATE 0.2 MG/ML IJ SOLN
INTRAMUSCULAR | Status: AC
  Filled 2018-08-29: qty 1

## 2018-08-29 MED ORDER — ZOLPIDEM TARTRATE 5 MG PO TABS: 5.0000 mg | ORAL_TABLET | Freq: Every evening | ORAL | Status: DC | PRN

## 2018-08-29 MED ORDER — ONDANSETRON HCL 4 MG/2ML IJ SOLN: 4.0000 mg | INTRAMUSCULAR | Status: DC | PRN

## 2018-08-29 MED ORDER — SIMETHICONE 80 MG PO CHEW: 80.0000 mg | CHEWABLE_TABLET | ORAL | Status: DC | PRN

## 2018-08-29 MED ORDER — SENNOSIDES-DOCUSATE SODIUM 8.6-50 MG PO TABS
2.0000 | ORAL_TABLET | ORAL | Status: DC
  Administered 2018-08-30: 2 via ORAL
  Filled 2018-08-29 (×2): qty 2

## 2018-08-29 MED ORDER — DIPHENHYDRAMINE HCL 25 MG PO CAPS: 25.0000 mg | ORAL_CAPSULE | Freq: Four times a day (QID) | ORAL | Status: DC | PRN

## 2018-08-29 MED ORDER — BENZOCAINE-MENTHOL 20-0.5 % EX AERO
1.0000 "application " | INHALATION_SPRAY | CUTANEOUS | Status: DC | PRN
  Administered 2018-08-29: 1 via TOPICAL
  Filled 2018-08-29: qty 56

## 2018-08-29 MED ORDER — MISOPROSTOL 200 MCG PO TABS
800.0000 ug | ORAL_TABLET | Freq: Once | ORAL | Status: AC
  Administered 2018-08-29: 800 ug via RECTAL

## 2018-08-29 MED ORDER — COCONUT OIL OIL: 1.0000 "application " | TOPICAL_OIL | Status: DC | PRN

## 2018-08-29 MED ORDER — WITCH HAZEL-GLYCERIN EX PADS
1.0000 "application " | MEDICATED_PAD | CUTANEOUS | Status: DC | PRN
  Administered 2018-08-29: 1 via TOPICAL

## 2018-08-29 NOTE — Progress Notes (Signed)
Labor Progress Note  S: Strip note, discussed plan of care with RN.   O: BP (!) 101/54   Pulse (!) 104   Temp 98.3 F (36.8 C) (Oral)   Resp 18   Ht 4\' 11"  (1.499 m)   Wt 74.3 kg Comment: measured at admission  LMP 11/27/2017 (Exact Date) Comment: pt shielded  SpO2 92%   BMI 33.08 kg/m   FHT: 145bpm, mod var, +accels, no decels TOCO: q3-4 min, patient looks comfortable during contractions  CVE: Dilation: 8.5 Effacement (%): 70(swollen ) Cervical Position: Middle Station: 0 Presentation: Vertex Exam by:: Irving BurtonEmily Rothermel RN   A&P: 27 y.o. G1P0000 4161w2d here for IOl fpr PE.   Currently on pitocin break Continue current management and restart pitocin after 4 hours of stopping if patient able to tolerate.  Anticipate SVD  [place] Leoncio Hansen, DO [facility] Resident PGY-2 08/29/2018 2:14 AM

## 2018-08-29 NOTE — Progress Notes (Signed)
SVE unchanged per RN. Informed Dr. Sheran Fava Wallace. Pitocin discontinued at this time for 4 hours. Will continue to monitor E. Meyer Dockery RN

## 2018-08-29 NOTE — Anesthesia Postprocedure Evaluation (Signed)
Anesthesia Post Note  Patient: Carmen [name]  Procedure(s) Performed: AN AD HOC LABOR EPIDURAL     Patient location during evaluation: Mother Baby Anesthesia Type: Epidural Level of consciousness: awake and alert and oriented Pain management: satisfactory to patient Vital Signs Assessment: post-procedure vital signs reviewed and stable Respiratory status: respiratory function stable Cardiovascular status: stable Postop Assessment: no headache, no backache, epidural receding, patient able to bend at knees, no signs of nausea or vomiting and adequate PO intake Anesthetic complications: no    Last Vitals:  Vitals:   08/29/18 1546 08/29/18 1600  BP: 113/70   Pulse: (!) 118   Resp: 16   Temp:  37.7 C  SpO2:      Last Pain:  Vitals:   08/29/18 1600  TempSrc: Oral  PainSc:    Pain Goal:                 Peggyann Zwiefelhofer

## 2018-08-29 NOTE — Progress Notes (Addendum)
LABOR PROGRESS NOTE  Carmen [name] is a 27 y.o. G1P0000 at 2872w2d  admitted for IOL 2/2 PE on lovenox, anemia  Subjective: Patient is resting comfortably in bed with peanut between legs. She states that she has a sensation of a BM. She just had a cervical check from nurse who stated she is ~9cm dilated now. She is going to try to rest for a while before time to push.  Objective: BP 124/68   Pulse (!) 104   Temp 99.3 F (37.4 C) (Axillary)   Resp 16   Ht 4\' 11"  (1.499 m)   Wt 74.3 kg Comment: measured at admission  LMP 11/27/2017 (Exact Date) Comment: pt shielded  SpO2 92%   BMI 33.08 kg/m  or  Vitals:   08/29/18 0701 08/29/18 0731 08/29/18 0735 08/29/18 0849  BP: 115/85 133/89 113/73 124/68  Pulse: (!) 109 (!) 117 (!) 108 (!) 104  Resp: 17 16 16 16   Temp:  98.8 F (37.1 C)  99.3 F (37.4 C)  TempSrc:  Oral  Axillary  SpO2:      Weight:      Height:        Dilation: 9 Effacement (%): 100 Cervical Position: Middle Station: Plus 1 Presentation: Vertex Exam by:: S Nix RN FHT: baseline rate 145, moderate varibility, positive, frequent acel, negative decel Toco: 2-4 minutes moderate, IUPC shows pressure 70-80, 130 MVU  Labs: Lab Results  Component Value Date   WBC 9.3 08/27/2018   HGB 8.8 (L) 08/27/2018   HCT 29.1 (L) 08/27/2018   MCV 80.6 08/27/2018   PLT 176 08/27/2018    Patient Active Problem List   Diagnosis Date Noted  . Maternal pulmonary embolus (PE), current pregnancy 08/27/2018  . Normocytic anemia 06/26/2018  . Supervision of high-risk pregnancy 04/10/2018  . Pulmonary embolism, antepartum 02/08/2018    Assessment / Plan: 27 y.o. G1P0000 at 2272w2d here for IOL 2/2 PE on lovenox, anemia. Patient is progressing in labor with reassuring fetal monitoring. Pain appears to be well controlled Pitocin was restarted ~0850 Will continue to monitor.  Labor: continue to monitor. Patient with IUPC in place and adequate contractions. Pitocin restarted and  patients tolerating well currently. Fetal Wellbeing:  Category I Pain Control:  epidural Anticipated MOD:  NSVD  [name] DO 08/29/2018, 9:04 AM   I confirm that I have verified the information documented in the resident's note and that I have also personally reperformed the physical exam and all medical decision making activities.   [name]  9:51 AM 08/29/18

## 2018-08-30 LAB — CBC
HEMATOCRIT: 19.7 % — AB (ref 36.0–46.0)
Hemoglobin: 6.1 g/dL — CL (ref 12.0–15.0)
MCH: 24.3 pg — ABNORMAL LOW (ref 26.0–34.0)
MCHC: 31 g/dL (ref 30.0–36.0)
MCV: 78.5 fL — AB (ref 80.0–100.0)
Platelets: 131 10*3/uL — ABNORMAL LOW (ref 150–400)
RBC: 2.51 MIL/uL — ABNORMAL LOW (ref 3.87–5.11)
RDW: 17.2 % — ABNORMAL HIGH (ref 11.5–15.5)
WBC: 17.2 10*3/uL — ABNORMAL HIGH (ref 4.0–10.5)
nRBC: 0 % (ref 0.0–0.2)

## 2018-08-30 LAB — PREPARE RBC (CROSSMATCH)

## 2018-08-30 LAB — COMPREHENSIVE METABOLIC PANEL
ALK PHOS: 108 U/L (ref 38–126)
ALT: 24 U/L (ref 0–44)
ANION GAP: 6 (ref 5–15)
AST: 47 U/L — ABNORMAL HIGH (ref 15–41)
Albumin: 2 g/dL — ABNORMAL LOW (ref 3.5–5.0)
BUN: 21 mg/dL — ABNORMAL HIGH (ref 6–20)
CALCIUM: 7.9 mg/dL — AB (ref 8.9–10.3)
CO2: 22 mmol/L (ref 22–32)
Chloride: 109 mmol/L (ref 98–111)
Creatinine, Ser: 1.01 mg/dL — ABNORMAL HIGH (ref 0.44–1.00)
Glucose, Bld: 79 mg/dL (ref 70–99)
POTASSIUM: 3.8 mmol/L (ref 3.5–5.1)
Sodium: 137 mmol/L (ref 135–145)
Total Bilirubin: 0.8 mg/dL (ref 0.3–1.2)
Total Protein: 4.8 g/dL — ABNORMAL LOW (ref 6.5–8.1)

## 2018-08-30 LAB — HEMOGLOBIN AND HEMATOCRIT, BLOOD
HCT: 24.8 % — ABNORMAL LOW (ref 36.0–46.0)
HEMOGLOBIN: 8 g/dL — AB (ref 12.0–15.0)

## 2018-08-30 MED ORDER — ENOXAPARIN SODIUM 60 MG/0.6ML ~~LOC~~ SOLN
60.0000 mg | SUBCUTANEOUS | Status: DC
  Administered 2018-08-30 – 2018-08-31 (×2): 60 mg via SUBCUTANEOUS
  Filled 2018-08-30 (×2): qty 0.6

## 2018-08-30 MED ORDER — DIPHENHYDRAMINE HCL 25 MG PO CAPS
25.0000 mg | ORAL_CAPSULE | Freq: Once | ORAL | Status: AC
  Administered 2018-08-30: 25 mg via ORAL
  Filled 2018-08-30: qty 1

## 2018-08-30 MED ORDER — ACETAMINOPHEN 325 MG PO TABS
650.0000 mg | ORAL_TABLET | Freq: Once | ORAL | Status: AC
  Administered 2018-08-30: 650 mg via ORAL
  Filled 2018-08-30: qty 2

## 2018-08-30 MED ORDER — SODIUM CHLORIDE 0.9% IV SOLUTION
Freq: Once | INTRAVENOUS | Status: AC
  Administered 2018-08-30: 11:00:00 via INTRAVENOUS

## 2018-08-30 NOTE — Progress Notes (Signed)
POSTPARTUM PROGRESS NOTE  Post Partum Day 1  Subjective:  Carmen [name] is a 27 y.o. G1P1001 s/p SVD at 2579w2d.  She reports she is doing well. No acute events overnight. She denies any problems with ambulating, voiding or po intake. Denies nausea or vomiting.  Pain is well controlled.  Lochia is improving.  Objective: Blood pressure 114/76, pulse 92, temperature 97.8 F (36.6 C), temperature source Oral, resp. rate 16, height 4\' 11"  (1.499 m), weight 74.3 kg, last menstrual period 11/27/2017, SpO2 100 %, unknown if currently breastfeeding.  Physical Exam:  General: alert, cooperative and no distress Chest: no respiratory distress Heart:regular rate, distal pulses intact Abdomen: soft, nontender,  Uterine Fundus: firm, appropriately tender, above umbilicus GU/GYN: External genitalia within normal limits. Exam performed in the presence of a chaperone. DVT Evaluation: No calf swelling or tenderness Extremities: mild BL edema to ankles Skin: warm, dry  Recent Labs    08/29/18 1911 08/30/18 0517  HGB 7.1* 6.1*  HCT 22.2* 19.7*    Assessment/Plan: Carmen [name] is a 27 y.o. G1P1001 s/p SVD at 379w2d   PPD#1 - Doing well Routine postpartum care Patient to restart Lovenox at 5pm on 11/22.  Anemia: Hgb 6.1 this am, asymptomatic but reports having excessive blood loss, patient to receive 2 U pRBC's; bleeding has slowed down per patient and is now wnl. No findings on exam of excessive bleeding or large hematomas.   AKI: Will repeat CMP when drawing posttransfusion H&H. Trend BMP / urinary output Replace electrolytes as indicated Avoid nephrotoxic agents, ensure adequate renal perfusion- stopped patient's ibuprofen  Contraception: unsure Feeding: breast Dispo: Plan for discharge tomorrow.   LOS: 3 days   [place] Stephany Poorman, DO PGY-2, [facility]  08/30/2018, 6:58 AM

## 2018-08-30 NOTE — Lactation Note (Signed)
This note was copied from a baby's chart. Lactation Consultation Note  Patient Name: Girl Dreama SaaJasmine Ebey ZOXWR'UToday's Date: 08/30/2018 Reason for consult: 1st time breastfeeding;Initial assessment;Term P1, 17 hour female infant  Mom with hx of anemia and will receive blood  transfusion later today.  [facility]. Infant was reluctant to latch at this time. Infant open mouth with wide gape but reluctant to suckle at this time. Mom demonstrated hand expression and infant was give 5 ml of colostrum by spoon. LC discussed I & O. Reviewed Baby & Me book's Breastfeeding Basics.  Mom made aware of O/P services, breastfeeding support groups, community resources, and our phone # for post-discharge questions.  Mom will continue to work on latching infant to breast.  Maternal Data Has patient been taught Hand Expression?: Yes(Mom demostrated hand expression and infant was given 5 ml of colostrum on spoon.) Does the patient have breastfeeding experience prior to this delivery?: No  Feeding Feeding Type: Breast Fed  LATCH Score Latch: Too sleepy or reluctant, no latch achieved, no sucking elicited.  Audible Swallowing: None  Type of Nipple: Everted at rest and after stimulation  Comfort (Breast/Nipple): Soft / non-tender  Hold (Positioning): Assistance needed to correctly position infant at breast and maintain latch.  LATCH Score: 5  Interventions Interventions: Breast feeding basics reviewed;Assisted with latch;Skin to skin;Hand express;Breast massage;Support pillows;Adjust position;Position options;Hand pump  Lactation Tools Discussed/Used Tools: Pump WIC Program: No(Interested in applying [place] WIC [place] Pump Review: Setup, frequency, and cleaning;Milk Storage Initiated by:: Danelle Earthlyobin Kinley Dozier, IBCLC Date initiated:: 08/30/18   Consult Status Consult Status: Follow-up Date: 08/30/18 Follow-up type: In-patient    Danelle EarthlyRobin Mahati Vajda 08/30/2018,  6:55 AM

## 2018-08-30 NOTE — Discharge Summary (Addendum)
Postpartum Discharge Summary     Patient Name: Carmen [name] DOB: 31-Oct-1990 MRN: [id number]  Date of admission: 08/27/2018 Delivering Provider: Thressa Sheller D   Date of discharge: 08/31/2018  Admitting diagnosis: 39wks induction Intrauterine pregnancy: [redacted]w[redacted]d     Secondary diagnosis:  Active Problems:   Supervision of high-risk pregnancy   Normocytic anemia   Maternal pulmonary embolus (PE), current pregnancy  Additional problems:  - hx of PE on lovenox     Discharge diagnosis: Term Pregnancy Delivered, Anemia, PPH and hx of PE this preganncy, on lovenox for 6 weeks postpartum                                                                                                Post partum procedures:blood transfusion  Augmentation: AROM, Pitocin, Cytotec and Foley Balloon  Complications: Hemorrhage>1079mL  Hospital course:  Induction of Labor With Vaginal Delivery   27 y.o. yo G1P1001 at [redacted]w[redacted]d was admitted to the hospital 08/27/2018 for induction of labor.  Indication for induction: - hx of PE @ 10wks; on lovenox.  Patient had a lengthy induction/labor course >48hrs. She remained afebrile, and progressed to SVD. There was extensive bldg from her lacerations with an EBL of 1326cc. Membrane Rupture Time/Date: 11:30 AM ,08/28/2018   Intrapartum Procedures: Episiotomy: None [1]                                         Lacerations:  2nd degree [3]  Patient had delivery of a Viable infant.  Information for the patient's newborn:  Carmen, [name] [[id number]]  Delivery Method: Vaginal, Spontaneous(Filed from Delivery Summary)   08/29/2018  Details of delivery can be found in separate delivery note.  Patient had a postpartum course remarkable for Hgb 6.1 on PPD#1- bld tx was recommended to the pt and accepted. She remained asymptomatic, however. She has 2u PRBC with a resulting Hgb of 8.0 (admission was 8.8). Her creatine was 2.3 on PPD#0 and was 1.0 after the transfusion. She was  started back on lovenox on PPD#1. Patient is discharged home 08/31/18.  Magnesium Sulfate recieved: No BMZ received: No  Physical exam  Vitals:   08/30/18 1455 08/30/18 1920 08/30/18 2200 08/31/18 0550  BP: 106/71 102/62 107/61 118/89  Pulse: 91 87 85 73  Resp: 20  16 18   Temp: 97.6 F (36.4 C) 98.1 F (36.7 C) 98.1 F (36.7 C) 98.2 F (36.8 C)  TempSrc: Oral Oral Oral Oral  SpO2: 100% 100% 100%   Weight:      Height:       General: alert, cooperative and no distress Lochia: appropriate Uterine Fundus: firm Incision: N/A DVT Evaluation: No evidence of DVT seen on physical exam. Negative Homan's sign. No cords or calf tenderness. Calf/Ankle edema is present Labs: Lab Results  Component Value Date   WBC 17.2 (H) 08/30/2018   HGB 8.0 (L) 08/30/2018   HCT 24.8 (L) 08/30/2018   MCV 78.5 (L) 08/30/2018   PLT 131 (L) 08/30/2018  CMP Latest Ref Rng & Units 08/30/2018  Glucose 70 - 99 mg/dL 79  BUN 6 - 20 mg/dL 40(J)  Creatinine 8.11 - 1.00 mg/dL 9.14(N)  Sodium 829 - 562 mmol/L 137  Potassium 3.5 - 5.1 mmol/L 3.8  Chloride 98 - 111 mmol/L 109  CO2 22 - 32 mmol/L 22  Calcium 8.9 - 10.3 mg/dL 7.9(L)  Total Protein 6.5 - 8.1 g/dL 4.8(L)  Total Bilirubin 0.3 - 1.2 mg/dL 0.8  Alkaline Phos 38 - 126 U/L 108  AST 15 - 41 U/L 47(H)  ALT 0 - 44 U/L 24    Discharge instruction: per After Visit Summary and "Baby and Me Booklet".  After visit meds:  Allergies as of 08/31/2018      Reactions   Morphine And Related Anaphylaxis      Medication List    STOP taking these medications   famotidine 20 MG tablet Commonly known as:  PEPCID   VITAFOL GUMMIES 3.33-0.333-34.8 MG Chew     TAKE these medications   acetaminophen 325 MG tablet Commonly known as:  TYLENOL Take 2 tablets (650 mg total) by mouth every 4 (four) hours as needed (for pain scale < 4).   enoxaparin 60 MG/0.6ML injection Commonly known as:  LOVENOX Inject 0.6 mLs (60 mg total) into the skin daily.    hydrocortisone 2.5 % cream Apply topically 2 (two) times daily.   iron polysaccharides 150 MG capsule Commonly known as:  NIFEREX Take 1 capsule (150 mg total) by mouth 2 (two) times daily.       Diet: routine diet  Activity: Advance as tolerated. Pelvic rest for 6 weeks.   Outpatient follow up:1wk for labs, then 4wk PP  Follow up Appt: Future Appointments  Date Time Provider Department Center  09/30/2018 10:15 AM [facility] LAB 4 [facility] None  09/30/2018 10:40 AM Johney Maine, MD [facility] None   Follow up Visit:   Please schedule this patient for Postpartum visit in: 4 weeks with the following provider: Any provider For C/S patients schedule nurse incision check in weeks 2 weeks: no High risk pregnancy complicated by: Bleeding disorder on Lovenox Delivery mode:  SVD Anticipated Birth Control:  other/unsure PP Procedures needed: repeat Creatinine and hemoglobin  Schedule Integrated BH visit: no   Newborn Data: Live born female  Birth Weight: 7 lb 10.1 oz (3460 g) APGAR: 1, 6  Newborn Delivery   Birth date/time:  08/29/2018 13:46:00 Delivery type:  Vaginal, Spontaneous     Baby Feeding: Breast Disposition:home with mother   08/31/2018 [place], DO PGY-2, [facility]   CNM attestation I have seen and examined this patient and agree with above documentation in the resident's note.   Carmen [name] is a 27 y.o. G1P1001 s/p SVD.   Pain is [name] controlled.  Plan for birth control is unsure.  Method of Feeding: breast  PE:  BP 118/89 (BP Location: Right Arm)   Pulse 73   Temp 98.2 F (36.8 C) (Oral)   Resp 18   Ht 4\' 11"  (1.499 m)   Wt 74.3 kg Comment: measured at admission  LMP 11/27/2017 (Exact Date) Comment: pt shielded  SpO2 100%   Breastfeeding? Unknown   BMI 33.08 kg/m  Fundus firm  Recent Labs    08/30/18 0517 08/30/18 1852  HGB 6.1* 8.0*  HCT 19.7* 24.8*     Plan: discharge today - postpartum  care discussed - f/u clinic in 1 week for labs (CBC/creatinine), then 4wks for postpartum  visit; hematology once finished with anticoagulation   Carmen [name], CNM 10:04 AM 08/31/2018

## 2018-08-30 NOTE — Discharge Instructions (Signed)

## 2018-08-31 LAB — BPAM RBC
Blood Product Expiration Date: 201912022359
Blood Product Expiration Date: 201912032359
ISSUE DATE / TIME: 201911221038
ISSUE DATE / TIME: 201911221256
UNIT TYPE AND RH: 9500
Unit Type and Rh: 9500

## 2018-08-31 LAB — TYPE AND SCREEN
ABO/RH(D): O POS
Antibody Screen: NEGATIVE
UNIT DIVISION: 0
Unit division: 0

## 2018-08-31 MED ORDER — ACETAMINOPHEN 325 MG PO TABS: 650.0000 mg | ORAL_TABLET | ORAL | 0 refills | Status: DC | PRN

## 2018-08-31 NOTE — Lactation Note (Signed)
This note was copied from a baby's chart. Lactation Consultation Note  Patient Name: Carmen [name] Reason for consult: Follow-up assessment;Term  Baby is 1044 hours old  LC reviewed and updated the doc flow sheets per mom.  Baby awake and rooting, LC offered assistance to latch,  LC reviewed basics of latching in the cross cradle position,  Depth achieved/ baby fed for 15 mins / swallows noted and mom  Comfortable. Nipple well rounded when baby released. Latch Score 8  LC reviewed basics of latching/ nutritive vs non- nutritive feeding patterns And the importance of not allowing baby to hand put latched.  Importance of STS feedings until the baby is back to birth weight / gaining steadily/  And can stay awake for a feeding. Sore nipple and engorgement prevention and tx.  Per mom having some tenderness initially with latch and improves.  LC noted some areola edema / compresses well / LC instructed mom on the use of  Breast shells between feedings except when sleeping.  Storage of breast milk page 36 in the Mother and Baby care booklet.  Mother informed of post-discharge support and given phone number to the lactation department, including services for phone call assistance; out-patient appointments; and breastfeeding support group. List of other breastfeeding resources in the community given in the handout. Encouraged mother to call for problems or concerns related to breastfeeding.    Maternal Data Has patient been taught Hand Expression?: Yes  Feeding Feeding Type: Breast Fed  LATCH Score Latch: Grasps breast easily, tongue down, lips flanged, rhythmical sucking.  Audible Swallowing: Spontaneous and intermittent  Type of Nipple: Everted at rest and after stimulation  Comfort (Breast/Nipple): Filling, red/small blisters or bruises, mild/mod discomfort  Hold (Positioning): Assistance needed to correctly position infant at breast and maintain  latch.  LATCH Score: 8  Interventions Interventions: Breast feeding basics reviewed;Assisted with latch;Breast massage;Hand express;Pre-pump if needed;Breast compression;Adjust position;Support pillows;Position options;Shells;Hand pump  Lactation Tools Discussed/Used Tools: Shells;Pump;Flanges Flange Size: 24;27 Shell Type: Inverted Breast pump type: Manual Pump Review: Milk Storage   Consult Status Consult Status: Complete Date: 08/31/18    Matilde SprangMargaret Ann Daine Croker [name], 10:03 AM

## 2018-09-03 ENCOUNTER — Telehealth: Payer: Self-pay | Admitting: Family Medicine

## 2018-09-03 NOTE — Telephone Encounter (Signed)
Called patient about coming in to her appointment scheduled for 09/04/18. Patient stated she will be here.

## 2018-09-04 ENCOUNTER — Ambulatory Visit (INDEPENDENT_AMBULATORY_CARE_PROVIDER_SITE_OTHER): Payer: Medicaid Other | Admitting: Internal Medicine

## 2018-09-04 ENCOUNTER — Encounter: Payer: Self-pay | Admitting: Internal Medicine

## 2018-09-04 VITALS — BP 135/88 | HR 88 | Wt 151.1 lb

## 2018-09-04 DIAGNOSIS — N179 Acute kidney failure, unspecified: Secondary | ICD-10-CM

## 2018-09-04 DIAGNOSIS — D509 Iron deficiency anemia, unspecified: Secondary | ICD-10-CM | POA: Diagnosis not present

## 2018-09-04 NOTE — Patient Instructions (Signed)
We will let you know your lab results.

## 2018-09-04 NOTE — Progress Notes (Signed)
   Subjective:    Carmen [name] - 27 y.o. female MRN [id number]  Date of birth: 03-14-1991  HPI  Carmen [name] is a 27 y.o. G1P1001 female 7 days PP from SVD. PP course complicated by PPH >1000 cc with subsequent HgB 6.1 on PPD#1. She received 2uPRBC with result HgB of 8.0. She reports she has been taking OTC Fe supplement and stool softener. Denies lightheadedness and dizziness. Reports energy level is unchanged. Denies SOB, sensation of heart racing or chest pain.   PP course also complicated by AKI with Cr 2.3 on PPD#0 with repeat Cr of 1.0 after transfusion. She reports normal UOP and good PO intake. Has not been taking any NSAIDs.     OB History    Gravida  1   Para  1   Term  1   Preterm  0   AB  0   Living  1     SAB  0   TAB  0   Ectopic  0   Multiple  0   Live Births  1           -  reports that she has quit smoking. Her smoking use included cigarettes. She has never used smokeless tobacco. - Review of Systems: Per HPI. - Past Medical History: Patient Active Problem List   Diagnosis Date Noted  . Maternal pulmonary embolus (PE), current pregnancy 08/27/2018  . Normocytic anemia 06/26/2018  . Supervision of high-risk pregnancy 04/10/2018  . Pulmonary embolism, antepartum 02/08/2018   - Medications: reviewed and updated   Objective:   Physical Exam BP 135/88   Pulse 88   Wt 151 lb 1.6 oz (68.5 kg)   BMI 30.52 kg/m  Gen: NAD, alert, cooperative with exam, well-appearing Psych: good insight, alert and oriented     Assessment & Plan:   1. Iron deficiency anemia, unspecified iron deficiency anemia type -continue Fe supplements  - CBC  2. AKI (acute kidney injury) (HCC) Repeat Cr. Encouraged good PO intake and avoidance of NSAIDs pending lab results.  - Basic Metabolic Panel (BMET)   Routine preventative health maintenance measures emphasized. Please refer to After Visit Summary for other counseling recommendations.   Return in about 3  weeks (around 09/25/2018) for postpartum visit .  Marcy Sirenatherine , D.O. OB Fellow  09/04/2018, 4:38 PM

## 2018-09-05 LAB — BASIC METABOLIC PANEL
BUN/Creatinine Ratio: 12 (ref 9–23)
BUN: 9 mg/dL (ref 6–20)
CALCIUM: 9.1 mg/dL (ref 8.7–10.2)
CO2: 20 mmol/L (ref 20–29)
Chloride: 109 mmol/L — ABNORMAL HIGH (ref 96–106)
Creatinine, Ser: 0.73 mg/dL (ref 0.57–1.00)
GFR, EST AFRICAN AMERICAN: 129 mL/min/{1.73_m2} (ref >59)
GFR, EST NON AFRICAN AMERICAN: 112 mL/min/{1.73_m2} (ref >59)
Glucose: 83 mg/dL (ref 65–99)
POTASSIUM: 4.1 mmol/L (ref 3.5–5.2)
Sodium: 144 mmol/L (ref 134–144)

## 2018-09-05 LAB — CBC
HEMATOCRIT: 27.8 % — AB (ref 34.0–46.6)
Hemoglobin: 8.9 g/dL — ABNORMAL LOW (ref 11.1–15.9)
MCH: 25.1 pg — ABNORMAL LOW (ref 26.6–33.0)
MCHC: 32 g/dL (ref 31.5–35.7)
MCV: 79 fL (ref 79–97)
Platelets: 194 10*3/uL (ref 150–450)
RBC: 3.54 x10E6/uL — ABNORMAL LOW (ref 3.77–5.28)
RDW: 17.2 % — AB (ref 12.3–15.4)
WBC: 9.9 10*3/uL (ref 3.4–10.8)

## 2018-09-30 ENCOUNTER — Inpatient Hospital Stay: Payer: Medicaid Other | Admitting: Hematology

## 2018-09-30 ENCOUNTER — Inpatient Hospital Stay: Payer: Medicaid Other | Attending: Hematology

## 2018-10-10 ENCOUNTER — Ambulatory Visit: Payer: Medicaid Other | Admitting: Family Medicine

## 2018-10-15 ENCOUNTER — Encounter: Payer: Self-pay | Admitting: *Deleted

## 2018-10-21 ENCOUNTER — Ambulatory Visit: Payer: Medicaid Other | Admitting: Obstetrics and Gynecology

## 2018-10-24 ENCOUNTER — Ambulatory Visit: Payer: Medicaid Other | Admitting: Obstetrics and Gynecology

## 2018-10-29 ENCOUNTER — Encounter: Payer: Self-pay | Admitting: Student

## 2018-10-29 ENCOUNTER — Ambulatory Visit (INDEPENDENT_AMBULATORY_CARE_PROVIDER_SITE_OTHER): Payer: Medicaid Other | Admitting: Student

## 2018-10-29 DIAGNOSIS — Z1389 Encounter for screening for other disorder: Secondary | ICD-10-CM

## 2018-10-29 MED ORDER — NORETHINDRONE 0.35 MG PO TABS: 1.0000 | ORAL_TABLET | Freq: Every day | ORAL | 11 refills | Status: DC

## 2018-10-29 NOTE — Progress Notes (Signed)
Subjective:     Carmen [name] is a 28 y.o. female who presents for a postpartum visit. She is 6 weeks postpartum following a spontaneous vaginal delivery. I have fully reviewed the prenatal and intrapartum course. The delivery was at 108w2d gestational weeks. Outcome: spontaneous vaginal delivery. Anesthesia: epidural. Postpartum course has been unremarkable. Baby's course has been unremarkable. Baby is feeding by breast. Bleeding staining only. Bowel function is normal. Bladder function is normal. Patient is not sexually active. Contraception method is none. Postpartum depression screening: negative. She feels a little bit of pressure ion her perineum when she stands or walks a certain way. She feels like her repair is well-healed.   She is still taking her Lovenox; she is taking one injection per day.  The following portions of the patient's history were reviewed and updated as appropriate: allergies, current medications, past family history, past medical history, past social history, past surgical history and problem list.  Review of Systems Pertinent items are noted in HPI.   Objective:    BP 118/66   Pulse 68   Wt 135 lb 8 oz (61.5 kg)   BMI 27.37 kg/m   General:  no distress   Breasts:  inspection negative, no nipple discharge or bleeding, no masses or nodularity palpable  Lungs: clear to auscultation bilaterally  Heart:  regular rate and rhythm, S1, S2 normal, no murmur, click, rub or gallop  Abdomen: soft, non-tender; bowel sounds normal; no masses,  no organomegaly   Vulva:  not evaluated  Vagina: not evaluated  Cervix:  not examined  Corpus: not examined  Adnexa:  not evaluated  Rectal Exam: Not performed.        Assessment:    Healthy  postpartum exam. Pap smear not done at today's visit.   Plan:    1. Contraception: willing to try micronor.  2. Declines to have perineum inspected.  3. CNM will consult with MD on lovenox injections and follow-up plan for her. Patient  knows that we will let her know through My Chart.  3. Follow up in: 1 year or as needed.

## 2019-04-15 ENCOUNTER — Other Ambulatory Visit: Payer: Self-pay | Admitting: *Deleted

## 2019-04-15 DIAGNOSIS — Z20822 Contact with and (suspected) exposure to covid-19: Secondary | ICD-10-CM

## 2019-04-21 LAB — NOVEL CORONAVIRUS, NAA: SARS-CoV-2, NAA: NOT DETECTED

## 2019-08-08 ENCOUNTER — Ambulatory Visit: Payer: Self-pay | Admitting: *Deleted

## 2019-08-08 NOTE — Telephone Encounter (Signed)
Summary: Swollen Nipple   Pt stated she has developed a swollen nipple in the last 2 days. She would like to speak with a nurse about this because she is breast feeding. Please advise.      Patient has 65 month old breastfeeding and is in process of weaning. Patient noticed she has R nipple swelling that is painful to touch. Patient states her milk is expressing normally and she does not feel any knots and warmth. Everything looks normal except for the nipple. patiwnt does state that her daughter could have injured her- she does remember a recent incident of her daughter not paying attention and causing her pain. Patient advised to call OB on call to discuss- UC if needed.  Reason for Disposition . [1] Breast pain AND [2] present > 7 days  Answer Assessment - Initial Assessment Questions 1. PAIN: "How bad is the pain?"  (Scale 1-10; or mild, moderate, severe)   - MILD - doesn't interfere with normal activities    - MODERATE - interferes with normal activities or awakens from sleep    - SEVERE - excruciating pain, unable to do any normal activities      Pain in nipple- R 2. SKIN: "Does the skin appear red?"       No redness or cracking 3. LOCATION: "Which breast?" (e.g., left, right, both)     R Nipple 4. DELIVERY: "When was the baby born?"      11 months ago 67. BREASTFEEDING: "Have you been breastfeeding?" If yes, ask: "When did you stop?"     Yes- patient is in the process of weaning  6. ONSET: "When did the breast pain start?" (e.g., hours, days)     2 days 7. FEVER: "Do you have a fever?" If so, ask: "What is it, how was it measured, and when did it start?"     no 8. OTHER SYMPTOMS: "Do you have any other symptoms?" (e.g., weakness, abdominal pain)     normal  Protocols used: POSTPARTUM - BREAST PAIN AND ENGORGEMENT-A-AH

## 2019-08-11 ENCOUNTER — Emergency Department (HOSPITAL_COMMUNITY)
Admission: EM | Admit: 2019-08-11 | Discharge: 2019-08-11 | Disposition: A | Discharge status: Home / Self Care | Payer: Medicaid Other | Attending: Emergency Medicine | Admitting: Emergency Medicine

## 2019-08-11 ENCOUNTER — Encounter (HOSPITAL_COMMUNITY): Payer: Self-pay | Admitting: Emergency Medicine

## 2019-08-11 ENCOUNTER — Other Ambulatory Visit: Payer: Self-pay

## 2019-08-11 DIAGNOSIS — J45909 Unspecified asthma, uncomplicated: Secondary | ICD-10-CM | POA: Insufficient documentation

## 2019-08-11 DIAGNOSIS — N61 Mastitis without abscess: Secondary | ICD-10-CM

## 2019-08-11 DIAGNOSIS — N644 Mastodynia: Secondary | ICD-10-CM | POA: Diagnosis present

## 2019-08-11 DIAGNOSIS — Z87891 Personal history of nicotine dependence: Secondary | ICD-10-CM | POA: Insufficient documentation

## 2019-08-11 DIAGNOSIS — Z86711 Personal history of pulmonary embolism: Secondary | ICD-10-CM | POA: Diagnosis not present

## 2019-08-11 DIAGNOSIS — Z79899 Other long term (current) drug therapy: Secondary | ICD-10-CM | POA: Diagnosis not present

## 2019-08-11 MED ORDER — IBUPROFEN 600 MG PO TABS: 600.0000 mg | ORAL_TABLET | Freq: Four times a day (QID) | ORAL | 0 refills | Status: DC | PRN

## 2019-08-11 MED ORDER — CEPHALEXIN 500 MG PO CAPS: 500.0000 mg | ORAL_CAPSULE | Freq: Four times a day (QID) | ORAL | 0 refills | Status: DC

## 2019-08-11 NOTE — ED Provider Notes (Signed)
[facility] Provider Note   CSN: [id number] Arrival date & time: 08/11/19  9357     History   Chief Complaint Chief Complaint  Patient presents with  . Breast Pain    HPI Carmen [name] is a 28 y.o. female.     HPI Patient has been breast-feeding for 11 months.  4 days ago she started getting pain in the right breast.  She reports the nipple and areola are painful.  The breast itself is also painful.  She has been able to continue pumping and the milk has appeared normal.  No fever, no chills, no general malaise.  She otherwise feels well. Past Medical History:  Diagnosis Date  . Anemia   . Asthma   . Panic attacks   . Pulmonary embolism ([employer]) 02/08/2018    Patient Active Problem List   Diagnosis Date Noted  . Postpartum care and examination 10/29/2018  . Maternal pulmonary embolus (PE), current pregnancy 08/27/2018  . Normocytic anemia 06/26/2018  . Pulmonary embolism, antepartum 02/08/2018    Past Surgical History:  Procedure Laterality Date  . NO PAST SURGERIES       OB History    Gravida  1   Para  1   Term  1   Preterm  0   AB  0   Living  1     SAB  0   TAB  0   Ectopic  0   Multiple  0   Live Births  1            Home Medications    Prior to Admission medications   Medication Sig Start Date End Date Taking? Authorizing Provider  acetaminophen (TYLENOL) 325 MG tablet Take 2 tablets (650 mg total) by mouth every 4 (four) hours as needed (for pain scale < 4). Patient not taking: Reported on 10/29/2018 08/31/18   Shirley, [place], DO  enoxaparin (LOVENOX) 60 MG/0.6ML injection Inject 0.6 mLs (60 mg total) into the skin daily. 08/16/18 10/29/18  Anyanwu, Sallyanne Havers, MD  hydrocortisone 2.5 % cream Apply topically 2 (two) times daily. Patient not taking: Reported on 07/24/2018 06/11/18   Virginia Rochester, NP  iron polysaccharides (NIFEREX) 150 MG capsule Take 1 capsule (150 mg total) by mouth 2 (two)  times daily. Patient not taking: Reported on 09/04/2018 07/04/18   Brunetta Genera, MD  norethindrone (MICRONOR,CAMILA,ERRIN) 0.35 MG tablet Take 1 tablet (0.35 mg total) by mouth daily. 10/29/18   Starr Lake, CNM    Family History Family History  Problem Relation Age of Onset  . Pulmonary embolism Mother   . Ulcers Sister     Social History Social History   Tobacco Use  . Smoking status: Former Smoker    Types: Cigarettes  . Smokeless tobacco: Never Used  Substance Use Topics  . Alcohol use: Not Currently  . Drug use: No     Allergies   Morphine and related   Review of Systems Review of Systems 10 Systems reviewed and are negative for acute change except as noted in the HPI.   Physical Exam Updated Vital Signs BP 108/83 (BP Location: Right Arm)   Pulse 76   Temp 98.6 F (37 C) (Oral)   Resp 16   Ht 4\' 11"  (1.499 m)   Wt 61 kg   LMP 07/24/2019   SpO2 98%   BMI 27.16 kg/m   Physical Exam Constitutional:      Comments: Alert and clinically  well in appearance.  No distress.  HENT:     Head: Normocephalic and atraumatic.  Eyes:     Extraocular Movements: Extraocular movements intact.     Conjunctiva/sclera: Conjunctivae normal.  Cardiovascular:     Rate and Rhythm: Normal rate and regular rhythm.  Pulmonary:     Effort: Pulmonary effort is normal.     Breath sounds: Normal breath sounds.     Comments: Right breast appears slightly more turgid than the left.  No erythema.  Not hot to the touch.  The nipple on the right is tender.  Slightly more generally firm compared to the left.  No hard induration, mass, fluctuance within the breast tissue.  No drainage.  See attached images.  No axillary lymphadenopathy.  No chest wall tenderness superior or medial to the breast. Abdominal:     General: There is no distension.     Palpations: Abdomen is soft.     Tenderness: There is no abdominal tenderness. There is no guarding.  Musculoskeletal:  Normal range of motion.  Skin:    General: Skin is warm and dry.  Neurological:     General: No focal deficit present.     Mental Status: She is oriented to person, place, and time.     Coordination: Coordination normal.  Psychiatric:        Mood and Affect: Mood normal.          ED Treatments / Results  Labs (all labs ordered are listed, but only abnormal results are displayed) Labs Reviewed - No data to display  EKG None  Radiology No results found.  Procedures Procedures (including critical care time)  Medications Ordered in ED Medications - No data to display   Initial Impression / Assessment and Plan / ED Course  I have reviewed the triage vital signs and the nursing notes.  Pertinent labs & imaging results that were available during my care of the patient were reviewed by me and considered in my medical decision making (see chart for details).       Patient clinically well.  She has been breast-feeding for 11 months.  Breast is slightly more firm but not tense and no fluctuance to suggest focal abscess.  At this time appears to be consistent with mastitis.  Will initiate Keflex, patient is instructed on warm compresses and to continue pumping milk.  She is instructed to follow-up with her GYN providers this week.  Final Clinical Impressions(s) / ED Diagnoses   Final diagnoses:  Mastitis    ED Discharge Orders    None       Arby Barrette, MD 08/11/19 [phone]

## 2019-08-11 NOTE — ED Triage Notes (Signed)
Pt c/o R sided breast pain/swelling x 4 days. Pt is currently breast feeding.

## 2020-03-22 ENCOUNTER — Other Ambulatory Visit: Payer: Self-pay

## 2020-03-22 ENCOUNTER — Ambulatory Visit (HOSPITAL_COMMUNITY)
Admission: EM | Admit: 2020-03-22 | Discharge: 2020-03-22 | Disposition: A | Discharge status: Home / Self Care | Payer: Medicaid Other | Attending: Family Medicine | Admitting: Family Medicine

## 2020-03-22 ENCOUNTER — Encounter (HOSPITAL_COMMUNITY): Payer: Self-pay

## 2020-03-22 DIAGNOSIS — K0889 Other specified disorders of teeth and supporting structures: Secondary | ICD-10-CM | POA: Diagnosis not present

## 2020-03-22 DIAGNOSIS — R1013 Epigastric pain: Secondary | ICD-10-CM | POA: Diagnosis not present

## 2020-03-22 DIAGNOSIS — Z20822 Contact with and (suspected) exposure to covid-19: Secondary | ICD-10-CM | POA: Diagnosis not present

## 2020-03-22 DIAGNOSIS — Z885 Allergy status to narcotic agent status: Secondary | ICD-10-CM | POA: Insufficient documentation

## 2020-03-22 DIAGNOSIS — Z87891 Personal history of nicotine dependence: Secondary | ICD-10-CM | POA: Diagnosis not present

## 2020-03-22 DIAGNOSIS — K047 Periapical abscess without sinus: Secondary | ICD-10-CM | POA: Diagnosis not present

## 2020-03-22 DIAGNOSIS — Z3202 Encounter for pregnancy test, result negative: Secondary | ICD-10-CM | POA: Diagnosis not present

## 2020-03-22 DIAGNOSIS — R5383 Other fatigue: Secondary | ICD-10-CM | POA: Insufficient documentation

## 2020-03-22 DIAGNOSIS — Z86711 Personal history of pulmonary embolism: Secondary | ICD-10-CM | POA: Diagnosis not present

## 2020-03-22 DIAGNOSIS — Z79899 Other long term (current) drug therapy: Secondary | ICD-10-CM | POA: Diagnosis not present

## 2020-03-22 DIAGNOSIS — R110 Nausea: Secondary | ICD-10-CM | POA: Diagnosis present

## 2020-03-22 LAB — POCT URINALYSIS DIP (DEVICE)
Bilirubin Urine: NEGATIVE
Glucose, UA: NEGATIVE mg/dL
Hgb urine dipstick: NEGATIVE
Ketones, ur: NEGATIVE mg/dL
Leukocytes,Ua: NEGATIVE
Nitrite: NEGATIVE
Protein, ur: NEGATIVE mg/dL
Specific Gravity, Urine: 1.025 (ref 1.005–1.030)
Urobilinogen, UA: 1 mg/dL (ref 0.0–1.0)
pH: 7.5 (ref 5.0–8.0)

## 2020-03-22 LAB — POC URINE PREG, ED: Preg Test, Ur: NEGATIVE

## 2020-03-22 MED ORDER — AMOXICILLIN-POT CLAVULANATE 875-125 MG PO TABS: 1.0000 | ORAL_TABLET | Freq: Two times a day (BID) | ORAL | 0 refills | Status: AC

## 2020-03-22 MED ORDER — ACETAMINOPHEN 500 MG PO TABS: 1000.0000 mg | ORAL_TABLET | Freq: Four times a day (QID) | ORAL | 0 refills | Status: DC | PRN

## 2020-03-22 MED ORDER — FAMOTIDINE 20 MG PO TABS: 20.0000 mg | ORAL_TABLET | Freq: Two times a day (BID) | ORAL | 0 refills | Status: DC

## 2020-03-22 NOTE — ED Provider Notes (Signed)
[facility]    CSN: [id number] Arrival date & time: 03/22/20  1038      History   Chief Complaint Chief Complaint  Patient presents with  . nausea, bloated    HPI Carmen [name] is a 29 y.o. female.   Patient presents for about 1 week history of nausea, bloating and concerned about a painful tooth.  She reports last week she was feeling episodes of nausea and uneasy feeling in her stomach throughout the day.  This would at times get better after eating however will return occasionally shortly thereafter.  She denies any episodes of vomiting.  She denies dizziness or lightheaded feeling.  She denied abdominal pain.  She reports she felt better over the weekend however this morning she had no episodes of nauseous feeling when walking to work.  She reports this morning she felt as though she may vomit however did not.  She has not had any fevers or chills.  No cough no congestion or sore throat.  She moves her bowels every 1 to 2 days and this is normal for her.  These bowel movements are not hard or strenuous to have.  Denies any dark stool or blood in her stool.  She reports she would like a pregnancy test this last time she felt this way she was pregnant.  She reports her last menstrual period began around 02/26/2020.  She denies any history of having issues with reflux or acid problems.  Denies waking up with bad breath.  Denies any urinary symptoms.  She reports she eats infrequently and did not eat breakfast this morning before feeling nauseous.  She does report eating some fried foods at times however this not a stable in her diet.  She also reports being fatigued.  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  She reports she recently finished breast-feeding and is no longer breast-feeding.  No reported lightheaded feeling.  Patient also reports a known bad tooth on the right lower mandible.  She reports this was very painful last week however  has been only intermittent painful since then.  She has noted a little bit of swelling around the tooth in her gums.  She reports she was originally planned to have this removed however the dentist rescheduled her and she is unsure when the follow-up appointment is at this time.  She reports it hurts quite a bit to chew on that side.  Denies having a primary care provider     Past Medical History:  Diagnosis Date  . Anemia   . Asthma   . Panic attacks   . Pulmonary embolism ([facility]) 02/08/2018    Patient Active Problem List   Diagnosis Date Noted  . Postpartum care and examination 10/29/2018  . Maternal pulmonary embolus (PE), current pregnancy 08/27/2018  . Normocytic anemia 06/26/2018  . Pulmonary embolism, antepartum 02/08/2018    Past Surgical History:  Procedure Laterality Date  . NO PAST SURGERIES      OB History    Gravida  1   Para  1   Term  1   Preterm  0   AB  0   Living  1     SAB  0   TAB  0   Ectopic  0   Multiple  0   Live Births  1            Home Medications    Prior to Admission  medications   Medication Sig Start Date End Date Taking? Authorizing Provider  acetaminophen (TYLENOL) 500 MG tablet Take 2 tablets (1,000 mg total) by mouth every 6 (six) hours as needed. 03/22/20   Melitza Metheny, Veryl Speak, PA-C  amoxicillin-clavulanate (AUGMENTIN) 875-125 MG tablet Take 1 tablet by mouth 2 (two) times daily for 10 days. 03/22/20 04/01/20  Zelia Yzaguirre, Veryl Speak, PA-C  cephALEXin (KEFLEX) 500 MG capsule Take 1 capsule (500 mg total) by mouth 4 (four) times daily. 08/11/19   Arby Barrette, MD  enoxaparin (LOVENOX) 60 MG/0.6ML injection Inject 0.6 mLs (60 mg total) into the skin daily. 08/16/18 10/29/18  Anyanwu, Jethro Bastos, MD  famotidine (PEPCID) 20 MG tablet Take 1 tablet (20 mg total) by mouth 2 (two) times daily. 03/22/20   Cobie Marcoux, Veryl Speak, PA-C  hydrocortisone 2.5 % cream Apply topically 2 (two) times daily. Patient not taking: Reported on 07/24/2018 06/11/18    Currie Paris, NP  ibuprofen (ADVIL) 600 MG tablet Take 1 tablet (600 mg total) by mouth every 6 (six) hours as needed. 08/11/19   Arby Barrette, MD  iron polysaccharides (NIFEREX) 150 MG capsule Take 1 capsule (150 mg total) by mouth 2 (two) times daily. Patient not taking: Reported on 09/04/2018 07/04/18   Johney Maine, MD  norethindrone (MICRONOR,CAMILA,ERRIN) 0.35 MG tablet Take 1 tablet (0.35 mg total) by mouth daily. 10/29/18   Marylene Land, CNM    Family History Family History  Problem Relation Age of Onset  . Pulmonary embolism Mother   . Ulcers Sister     Social History Social History   Tobacco Use  . Smoking status: Former Smoker    Types: Cigarettes  . Smokeless tobacco: Never Used  Substance Use Topics  . Alcohol use: Yes    Comment: occ  . Drug use: No     Allergies   Morphine and related   Review of Systems Review of Systems   Physical Exam Triage Vital Signs ED Triage Vitals  Enc Vitals Group     BP 03/22/20 1101 116/78     Pulse Rate 03/22/20 1101 74     Resp 03/22/20 1101 16     Temp 03/22/20 1101 99 F (37.2 C)     Temp Source 03/22/20 1101 Oral     SpO2 --      Weight 03/22/20 1102 135 lb (61.2 kg)     Height 03/22/20 1102 4\' 11"  (1.499 m)     Head Circumference --      Peak Flow --      Pain Score 03/22/20 1101 8     Pain Loc --      Pain Edu? --      Excl. in GC? --    No data found.  Updated Vital Signs BP 116/78   Pulse 74   Temp 99 F (37.2 C) (Oral)   Resp 16   Ht 4\' 11"  (1.499 m)   Wt 135 lb (61.2 kg)   BMI 27.27 kg/m   Visual Acuity Right Eye Distance:   Left Eye Distance:   Bilateral Distance:    Right Eye Near:   Left Eye Near:    Bilateral Near:     Physical Exam Vitals and nursing note reviewed.  Constitutional:      General: She is not in acute distress.    Appearance: Normal appearance. She is well-developed. She is not ill-appearing.  HENT:     Head: Normocephalic and  atraumatic.     Mouth/Throat:  Dentition: Abnormal dentition. Dental tenderness and gingival swelling present.      Comments: Dentition overall not so bad with the exception of the cracked decaying molar on the right side. Eyes:     Conjunctiva/sclera: Conjunctivae normal.  Cardiovascular:     Rate and Rhythm: Normal rate and regular rhythm.     Heart sounds: No murmur heard.   Pulmonary:     Effort: Pulmonary effort is normal. No respiratory distress.     Breath sounds: Normal breath sounds.  Abdominal:     Palpations: Abdomen is soft.     Tenderness: There is no abdominal tenderness. There is no right CVA tenderness or left CVA tenderness.  Musculoskeletal:     Cervical back: Neck supple.     Right lower leg: No edema.     Left lower leg: No edema.  Skin:    General: Skin is warm and dry.  Neurological:     Mental Status: She is alert.      UC Treatments / Results  Labs (all labs ordered are listed, but only abnormal results are displayed) Labs Reviewed  SARS CORONAVIRUS 2 (TAT 6-24 HRS)  POCT URINALYSIS DIP (DEVICE)  POC URINE PREG, ED    EKG   Radiology No results found.  Procedures Procedures (including critical care time)  Medications Ordered in UC Medications - No data to display  Initial Impression / Assessment and Plan / UC Course  I have reviewed the triage vital signs and the nursing notes.  Pertinent labs & imaging results that were available during my care of the patient were reviewed by me and considered in my medical decision making (see chart for details).     #Dyspepsia #Fatigue #Dental pain with likely infection Patient is a 29 year old presenting with a 1 week history of dyspepsia and fatigue as well as a likely dental infection.  Given fatigue and vague GI symptoms will test for Covid out of an abundance of caution.  Reassuring she is not vomiting, believe there may be an acid component to this and will recommend Pepcid and healthy  regular meals.  Dental complaints suspicious for early infection will start on Augmentin and have her follow-up close term with her dentist for definitive care.  Discussed return precautions.  Primary care follow-up option given to establish care.  Patient verbalizes understanding plan of care.   Final Clinical Impressions(s) / UC Diagnoses   Final diagnoses:  Dyspepsia  Pain, dental  Dental infection  Fatigue, unspecified type     Discharge Instructions     Take the pepcid 2 times a day to aid in upset stomach  Take the augmentin 2 times a day for 10 days pending a dental appointment  You may take tylenol for pain  If symptoms worsen please return  Schedule with the family medicine center to establish care  Call a dentist ASAP to have the tooth removed  [phone] [employer] Dental clinic in [place], [place] Address: [address] [place], [place] [address] Hours:  Open ? Closes 5:30PM Phone: [phone]    ED Prescriptions    Medication Sig Dispense Auth. Provider   amoxicillin-clavulanate (AUGMENTIN) 875-125 MG tablet Take 1 tablet by mouth 2 (two) times daily for 10 days. 20 tablet Jalina Blowers, Marguerita Beards, PA-C   acetaminophen (TYLENOL) 500 MG tablet Take 2 tablets (1,000 mg total) by mouth every 6 (six) hours as needed. 30 tablet Tylan Kinn, Marguerita Beards, PA-C   famotidine (PEPCID) 20 MG tablet  Take 1 tablet (20 mg total) by mouth 2 (two) times daily. 30 tablet Michiel Sivley, Veryl Speak, PA-C     PDMP not reviewed this encounter.   Hermelinda Medicus, PA-C 03/22/20 1210

## 2020-03-22 NOTE — ED Triage Notes (Signed)
Pt c/o nausea, stomach bloated, fatiguex1wk. Pt wanted pregnancy test, because last time she was feeling this way she was pregnant. Pt states has broken tooth in right lower mouth and is causing pain.

## 2020-03-22 NOTE — Discharge Instructions (Addendum)
Take the pepcid 2 times a day to aid in upset stomach  Take the augmentin 2 times a day for 10 days pending a dental appointment  You may take tylenol for pain  If symptoms worsen please return  Schedule with the family medicine center to establish care  Call a dentist ASAP to have the tooth removed  [phone] [employer] Dental clinic in [place], [place] Address: [address] [place], [place], [place] [address] Hours:  Open ? Closes 5:30PM Phone: [phone]

## 2020-03-23 LAB — SARS CORONAVIRUS 2 (TAT 6-24 HRS): SARS Coronavirus 2: NEGATIVE

## 2020-04-21 ENCOUNTER — Ambulatory Visit (HOSPITAL_COMMUNITY)
Admission: EM | Admit: 2020-04-21 | Discharge: 2020-04-21 | Disposition: A | Discharge status: Home / Self Care | Payer: Medicaid Other | Attending: Urgent Care | Admitting: Urgent Care

## 2020-04-21 ENCOUNTER — Other Ambulatory Visit: Payer: Self-pay

## 2020-04-21 ENCOUNTER — Encounter (HOSPITAL_COMMUNITY): Payer: Self-pay

## 2020-04-21 DIAGNOSIS — Z3202 Encounter for pregnancy test, result negative: Secondary | ICD-10-CM | POA: Diagnosis not present

## 2020-04-21 DIAGNOSIS — N898 Other specified noninflammatory disorders of vagina: Secondary | ICD-10-CM | POA: Insufficient documentation

## 2020-04-21 LAB — POC URINE PREG, ED: Preg Test, Ur: NEGATIVE

## 2020-04-21 NOTE — ED Provider Notes (Signed)
[facility]    CSN: [id number] Arrival date & time: 04/21/20  1443      History   Chief Complaint Chief Complaint  Patient presents with  . Possible Pregnancy  . Vaginal Discharge    HPI Carmen [name] is a 29 y.o. female.   Patient is a 29 year old female that presents today with concern for pregnancy.  Reporting some side and lower abdominal cramping that has been coming and going.  She has had some thick, white vaginal discharge denies any odor, dysuria, hematuria urinary frequency. Patient's last menstrual period was 03/29/2020.  Reporting Her last menstrual cycle was very short and only 2 days with mild spotting. She does not use birth control. Currently sexually active with one partner. No specific concern for STD.   ROS per HPI       Past Medical History:  Diagnosis Date  . Anemia   . Asthma   . Panic attacks   . Pulmonary embolism ([facility]) 02/08/2018    Patient Active Problem List   Diagnosis Date Noted  . Postpartum care and examination 10/29/2018  . Maternal pulmonary embolus (PE), current pregnancy 08/27/2018  . Normocytic anemia 06/26/2018  . Pulmonary embolism, antepartum 02/08/2018    Past Surgical History:  Procedure Laterality Date  . NO PAST SURGERIES      OB History    Gravida  1   Para  1   Term  1   Preterm  0   AB  0   Living  1     SAB  0   TAB  0   Ectopic  0   Multiple  0   Live Births  1            Home Medications    Prior to Admission medications   Not on File    Family History Family History  Problem Relation Age of Onset  . Pulmonary embolism Mother   . Ulcers Sister     Social History Social History   Tobacco Use  . Smoking status: Former Smoker    Types: Cigarettes  . Smokeless tobacco: Never Used  Substance Use Topics  . Alcohol use: Yes    Comment: occ  . Drug use: No     Allergies   Morphine and related   Review of Systems Review of Systems   Physical  Exam Triage Vital Signs ED Triage Vitals  Enc Vitals Group     BP 04/21/20 0841 104/71     Pulse Rate 04/21/20 0841 74     Resp 04/21/20 0841 16     Temp 04/21/20 0841 98.8 F (37.1 C)     Temp src --      SpO2 04/21/20 0841 100 %     Weight --      Height --      Head Circumference --      Peak Flow --      Pain Score 04/21/20 0927 0     Pain Loc --      Pain Edu? --      Excl. in GC? --    No data found.  Updated Vital Signs BP 104/71   Pulse 74   Temp 98.8 F (37.1 C)   Resp 16   LMP 03/29/2020   SpO2 100%   Visual Acuity Right Eye Distance:   Left Eye Distance:   Bilateral Distance:    Right Eye Near:   Left Eye Near:    Bilateral  Near:     Physical Exam Vitals and nursing note reviewed.  Constitutional:      General: She is not in acute distress.    Appearance: Normal appearance. She is not ill-appearing, toxic-appearing or diaphoretic.  HENT:     Head: Normocephalic.     Nose: Nose normal.  Eyes:     Conjunctiva/sclera: Conjunctivae normal.  Pulmonary:     Effort: Pulmonary effort is normal.  Musculoskeletal:        General: Normal range of motion.     Cervical back: Normal range of motion.  Skin:    General: Skin is warm and dry.     Findings: No rash.  Neurological:     Mental Status: She is alert.  Psychiatric:        Mood and Affect: Mood normal.      UC Treatments / Results  Labs (all labs ordered are listed, but only abnormal results are displayed) Labs Reviewed  POC URINE PREG, ED  CERVICOVAGINAL ANCILLARY ONLY    EKG   Radiology No results found.  Procedures Procedures (including critical care time)  Medications Ordered in UC Medications - No data to display  Initial Impression / Assessment and Plan / UC Course  I have reviewed the triage vital signs and the nursing notes.  Pertinent labs & imaging results that were available during my care of the patient were reviewed by me and considered in my medical decision  making (see chart for details).     Vaginal discharge Urine pregnancy test negative. Sending swab for further testing Contact given for OB/GYN for follow-up Final Clinical Impressions(s) / UC Diagnoses   Final diagnoses:  Vaginal discharge     Discharge Instructions     We are checking a swab and will call you with any positive results. Your pregnancy test was negative    ED Prescriptions    None     PDMP not reviewed this encounter.   Janace Aris, NP 04/21/20 1349

## 2020-04-21 NOTE — Discharge Instructions (Addendum)
We are checking a swab and will call you with any positive results. Your pregnancy test was negative

## 2020-04-21 NOTE — ED Triage Notes (Signed)
Pt's LMP was June 21-24, patient is concerned she is pregnant due to "stretching feeling in my side, it was the same the last time I was pregnant." Also states her vaginal discharge is "thick"

## 2020-04-22 LAB — CERVICOVAGINAL ANCILLARY ONLY
Bacterial Vaginitis (gardnerella): NEGATIVE
Candida Glabrata: NEGATIVE
Candida Vaginitis: POSITIVE — AB
Chlamydia: NEGATIVE
Comment: NEGATIVE
Comment: NEGATIVE
Comment: NEGATIVE
Comment: NEGATIVE
Comment: NEGATIVE
Comment: NORMAL
Neisseria Gonorrhea: NEGATIVE
Trichomonas: NEGATIVE

## 2020-04-23 ENCOUNTER — Telehealth (HOSPITAL_COMMUNITY): Payer: Self-pay

## 2020-04-23 MED ORDER — FLUCONAZOLE 150 MG PO TABS
150.0000 mg | ORAL_TABLET | Freq: Every day | ORAL | 0 refills | Status: AC
Start: 2020-04-23 — End: 2020-04-25

## 2020-05-13 ENCOUNTER — Ambulatory Visit (HOSPITAL_COMMUNITY)
Admission: EM | Admit: 2020-05-13 | Discharge: 2020-05-13 | Disposition: A | Discharge status: Home / Self Care | Payer: Medicaid Other | Attending: Family Medicine | Admitting: Family Medicine

## 2020-05-13 ENCOUNTER — Encounter (HOSPITAL_COMMUNITY): Payer: Self-pay

## 2020-05-13 ENCOUNTER — Other Ambulatory Visit: Payer: Self-pay

## 2020-05-13 DIAGNOSIS — Z3202 Encounter for pregnancy test, result negative: Secondary | ICD-10-CM | POA: Diagnosis not present

## 2020-05-13 DIAGNOSIS — R102 Pelvic and perineal pain: Secondary | ICD-10-CM | POA: Diagnosis not present

## 2020-05-13 DIAGNOSIS — R109 Unspecified abdominal pain: Secondary | ICD-10-CM | POA: Diagnosis present

## 2020-05-13 DIAGNOSIS — J4541 Moderate persistent asthma with (acute) exacerbation: Secondary | ICD-10-CM | POA: Insufficient documentation

## 2020-05-13 DIAGNOSIS — Z1152 Encounter for screening for COVID-19: Secondary | ICD-10-CM

## 2020-05-13 LAB — POC URINE PREG, ED: Preg Test, Ur: NEGATIVE

## 2020-05-13 LAB — POCT URINALYSIS DIPSTICK, ED / UC
Bilirubin Urine: NEGATIVE
Glucose, UA: NEGATIVE mg/dL
Ketones, ur: NEGATIVE mg/dL
Leukocytes,Ua: NEGATIVE
Nitrite: NEGATIVE
Protein, ur: NEGATIVE mg/dL
Specific Gravity, Urine: >=1.03 (ref 1.005–1.030)
Urobilinogen, UA: 1 mg/dL (ref 0.0–1.0)
pH: 6 (ref 5.0–8.0)

## 2020-05-13 LAB — SARS CORONAVIRUS 2 (TAT 6-24 HRS): SARS Coronavirus 2: NEGATIVE

## 2020-05-13 MED ORDER — ALBUTEROL SULFATE HFA 108 (90 BASE) MCG/ACT IN AERS: 1.0000 | INHALATION_SPRAY | Freq: Four times a day (QID) | RESPIRATORY_TRACT | 2 refills | Status: DC | PRN

## 2020-05-13 NOTE — Discharge Instructions (Addendum)
You have been tested for COVID-19 today. °If your test returns positive, you will receive a phone call from [facility] regarding your results. °Negative test results are not called. °Both positive and negative results area always visible on MyChart. °If you do not have a MyChart account, sign up instructions are provided in your discharge papers. °Please do not hesitate to contact us should you have questions or concerns. ° °

## 2020-05-13 NOTE — ED Provider Notes (Signed)
[facility] CARE CENTER   [id number] 05/13/20 Arrival Time: 6301  ASSESSMENT & PLAN:  1. Moderate persistent asthma with exacerbation   2. Encounter for screening for COVID-19   3. Right sided abdominal pain     No indication for chest imaging at this time.   Discharge Instructions     You have been tested for COVID-19 today. If your test returns positive, you will receive a phone call from [facility] regarding your results. Negative test results are not called. Both positive and negative results area always visible on MyChart. If you do not have a MyChart account, sign up instructions are provided in your discharge papers. Please do not hesitate to contact us should you have questions or concerns.     COVID vaccine counseling; contemplative stage. Without s/s of PID. Suspect muscular R side pain. She is comfortable with observation.  Labs Reviewed  POCT URINALYSIS DIPSTICK, ED / UC  POC URINE PREG, ED  CERVICOVAGINAL ANCILLARY ONLY    Will notify of any positive results on cytology.  Reviewed expectations re: course of current medical issues. Questions answered. Outlined signs and symptoms indicating need for more acute intervention. Patient verbalized understanding. After Visit Summary given.   SUBJECTIVE:  Carmen [name] is a 29 y.o. female who presents with fairly persistent moderate asthma symptoms while working in heat/dust at work. Requests note stating she cannot work in these conditions; alternative available. No current SOB. Needs refill of inhalers. No chest pain. Does report R side pain for a few days; no trauma; 'feels like a pulling in my side'; no specific abd pain. Normal bowel/bladder habits. Ambulatory without difficulty.  Patient's last menstrual period was 04/30/2020.   OBJECTIVE:  Vitals:   05/13/20 1011  BP: 108/68  Pulse: 74  Resp: 18  Temp: 98.2 F (36.8 C)  TempSrc: Oral  SpO2: 100%     General appearance: alert, cooperative,  appears stated age and no distress Lungs: unlabored respirations; speaks full sentences without difficulty; no active wheezing Back: no CVA tenderness; FROM at waist Abd: soft; no specific abd pain to palp GU: deferred Skin: warm and dry Psychological: alert and cooperative; normal mood and affect.    Labs Reviewed  POCT URINALYSIS DIPSTICK, ED / UC  POC URINE PREG, ED  CERVICOVAGINAL ANCILLARY ONLY    Allergies  Allergen Reactions  . Morphine And Related Anaphylaxis    Past Medical History:  Diagnosis Date  . Anemia   . Asthma   . Panic attacks   . Pulmonary embolism (HCC) 02/08/2018   Family History  Problem Relation Age of Onset  . Pulmonary embolism Mother   . Ulcers Sister    Social History   Socioeconomic History  . Marital status: Single    Spouse name: Not on file  . Number of children: Not on file  . Years of education: Not on file  . Highest education level: Not on file  Occupational History  . Not on file  Tobacco Use  . Smoking status: Former Smoker    Types: Cigarettes  . Smokeless tobacco: Never Used  Substance and Sexual Activity  . Alcohol use: Yes    Comment: occ  . Drug use: No  . Sexual activity: Yes  Other Topics Concern  . Not on file  Social History Narrative  . Not on file   Social Determinants of Health   Financial Resource Strain:   . Difficulty of Paying Living Expenses:   Food Insecurity:   . Worried About  Running Out of Food in the Last Year:   . Ran Out of Food in the Last Year:   Transportation Needs:   . Lack of Transportation (Medical):   Marland Kitchen Lack of Transportation (Non-Medical):   Physical Activity:   . Days of Exercise per Week:   . Minutes of Exercise per Session:   Stress:   . Feeling of Stress :   Social Connections:   . Frequency of Communication with Friends and Family:   . Frequency of Social Gatherings with Friends and Family:   . Attends Religious Services:   . Active Member of Clubs or Organizations:    . Attends Banker Meetings:   Marland Kitchen Marital Status:   Intimate Partner Violence:   . Fear of Current or Ex-Partner:   . Emotionally Abused:   Marland Kitchen Physically Abused:   . Sexually Abused:           Mardella Layman, MD 05/13/20 1056

## 2020-05-13 NOTE — ED Triage Notes (Signed)
Pt presents with recurrent right side lower pelvic pain.  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

## 2020-05-14 ENCOUNTER — Telehealth (HOSPITAL_COMMUNITY): Payer: Self-pay

## 2020-05-14 LAB — CERVICOVAGINAL ANCILLARY ONLY
Bacterial Vaginitis (gardnerella): POSITIVE — AB
Candida Glabrata: NEGATIVE
Candida Vaginitis: NEGATIVE
Chlamydia: NEGATIVE
Comment: NEGATIVE
Comment: NEGATIVE
Comment: NEGATIVE
Comment: NEGATIVE
Comment: NEGATIVE
Comment: NORMAL
Neisseria Gonorrhea: NEGATIVE
Trichomonas: NEGATIVE

## 2020-05-14 MED ORDER — METRONIDAZOLE 500 MG PO TABS: 500.0000 mg | ORAL_TABLET | Freq: Two times a day (BID) | ORAL | 0 refills | Status: DC

## 2020-05-21 ENCOUNTER — Emergency Department (HOSPITAL_COMMUNITY): Payer: Medicaid Other

## 2020-05-21 ENCOUNTER — Emergency Department (HOSPITAL_COMMUNITY)
Admission: EM | Admit: 2020-05-21 | Discharge: 2020-05-21 | Disposition: A | Discharge status: Home / Self Care | Payer: Medicaid Other | Attending: Emergency Medicine | Admitting: Emergency Medicine

## 2020-05-21 ENCOUNTER — Other Ambulatory Visit: Payer: Self-pay

## 2020-05-21 ENCOUNTER — Encounter (HOSPITAL_COMMUNITY): Payer: Self-pay

## 2020-05-21 DIAGNOSIS — J45909 Unspecified asthma, uncomplicated: Secondary | ICD-10-CM | POA: Diagnosis not present

## 2020-05-21 DIAGNOSIS — R0789 Other chest pain: Secondary | ICD-10-CM | POA: Diagnosis not present

## 2020-05-21 DIAGNOSIS — R05 Cough: Secondary | ICD-10-CM | POA: Diagnosis not present

## 2020-05-21 DIAGNOSIS — Z20822 Contact with and (suspected) exposure to covid-19: Secondary | ICD-10-CM | POA: Diagnosis not present

## 2020-05-21 DIAGNOSIS — J189 Pneumonia, unspecified organism: Secondary | ICD-10-CM

## 2020-05-21 DIAGNOSIS — R0602 Shortness of breath: Secondary | ICD-10-CM | POA: Insufficient documentation

## 2020-05-21 DIAGNOSIS — R079 Chest pain, unspecified: Secondary | ICD-10-CM | POA: Diagnosis not present

## 2020-05-21 DIAGNOSIS — G4489 Other headache syndrome: Secondary | ICD-10-CM | POA: Diagnosis not present

## 2020-05-21 DIAGNOSIS — Z87891 Personal history of nicotine dependence: Secondary | ICD-10-CM | POA: Insufficient documentation

## 2020-05-21 LAB — TROPONIN I (HIGH SENSITIVITY)
Troponin I (High Sensitivity): <2 ng/L (ref <18)
Troponin I (High Sensitivity): <2 ng/L (ref <18)

## 2020-05-21 LAB — CBC
HCT: 39.4 % (ref 36.0–46.0)
Hemoglobin: 12.3 g/dL (ref 12.0–15.0)
MCH: 28.3 pg (ref 26.0–34.0)
MCHC: 31.2 g/dL (ref 30.0–36.0)
MCV: 90.6 fL (ref 80.0–100.0)
Platelets: 219 10*3/uL (ref 150–400)
RBC: 4.35 MIL/uL (ref 3.87–5.11)
RDW: 13.1 % (ref 11.5–15.5)
WBC: 5.5 10*3/uL (ref 4.0–10.5)
nRBC: 0 % (ref 0.0–0.2)

## 2020-05-21 LAB — BASIC METABOLIC PANEL
Anion gap: 8 (ref 5–15)
BUN: 10 mg/dL (ref 6–20)
CO2: 23 mmol/L (ref 22–32)
Calcium: 9 mg/dL (ref 8.9–10.3)
Chloride: 107 mmol/L (ref 98–111)
Creatinine, Ser: 0.78 mg/dL (ref 0.44–1.00)
GFR calc Af Amer: >60 mL/min (ref >60)
GFR calc non Af Amer: >60 mL/min (ref >60)
Glucose, Bld: 78 mg/dL (ref 70–99)
Potassium: 3.7 mmol/L (ref 3.5–5.1)
Sodium: 138 mmol/L (ref 135–145)

## 2020-05-21 LAB — SARS CORONAVIRUS 2 BY RT PCR (HOSPITAL ORDER, PERFORMED IN ~~LOC~~ HOSPITAL LAB): SARS Coronavirus 2: NEGATIVE

## 2020-05-21 LAB — I-STAT BETA HCG BLOOD, ED (MC, WL, AP ONLY): I-stat hCG, quantitative: <5 m[IU]/mL (ref <5)

## 2020-05-21 MED ORDER — AZITHROMYCIN 250 MG PO TABS
250.0000 mg | ORAL_TABLET | Freq: Every day | ORAL | 0 refills | Status: DC
Start: 2020-05-21 — End: 2020-06-06

## 2020-05-21 MED ORDER — SODIUM CHLORIDE 0.9 % IV BOLUS
1000.0000 mL | Freq: Once | INTRAVENOUS | Status: AC
  Administered 2020-05-21: 1000 mL via INTRAVENOUS

## 2020-05-21 MED ORDER — IOHEXOL 350 MG/ML SOLN
100.0000 mL | Freq: Once | INTRAVENOUS | Status: AC | PRN
  Administered 2020-05-21: 100 mL via INTRAVENOUS

## 2020-05-21 NOTE — ED Triage Notes (Addendum)
Pt BIB [facility] for eval of L sided CP/pressure, L sided upper back pain, SOB, cough and HA. Onset x 2 days. Pt BIB from work by EMS. Pt reports CP worse w/ inhalation, cough, ROM. LS CTAB, NARD. Pt rec'd 324 ASA.

## 2020-05-21 NOTE — Discharge Instructions (Addendum)
You were tested for Covid so stay home until Covid result comes back.  We do not have any blood clots right now in your lung.  You may have a small area of pneumonia.  Please take Z-Pak.  See your doctor for follow-up.  Your COVID test is negative   Return to ER if you have chest pain, shortness of breath, fever, trouble breathing.

## 2020-05-21 NOTE — ED Notes (Signed)
Patient transported to CT 

## 2020-05-21 NOTE — ED Provider Notes (Signed)
[facility] EMERGENCY DEPARTMENT Provider Note   CSN: [id number] Arrival date & time: 05/21/20  1156     History Chief Complaint  Patient presents with  . Chest Pain    Carmen [name] is a 29 y.o. female history of anemia, previous PE here presenting with shortness of breath.  Patient has some subjective shortness of breath and myalgias since yesterday.  She states that this is similar to her previous PE.  She states that she had a PE while she was [redacted] weeks pregnant several years ago.  She is no longer on blood thinners.  Patient had no recent travel.  Patient denies any recent exposure to Covid.  She states that she tested negative for Covid 2 weeks ago.  She did not receive the Covid vaccine.  The history is provided by the patient.       Past Medical History:  Diagnosis Date  . Anemia   . Asthma   . Panic attacks   . Pulmonary embolism (HCC) 02/08/2018    Patient Active Problem List   Diagnosis Date Noted  . Postpartum care and examination 10/29/2018  . Maternal pulmonary embolus (PE), current pregnancy 08/27/2018  . Normocytic anemia 06/26/2018  . Pulmonary embolism, antepartum 02/08/2018    Past Surgical History:  Procedure Laterality Date  . NO PAST SURGERIES       OB History    Gravida  1   Para  1   Term  1   Preterm  0   AB  0   Living  1     SAB  0   TAB  0   Ectopic  0   Multiple  0   Live Births  1           Family History  Problem Relation Age of Onset  . Pulmonary embolism Mother   . Ulcers Sister     Social History   Tobacco Use  . Smoking status: Former Smoker    Types: Cigarettes  . Smokeless tobacco: Never Used  Substance Use Topics  . Alcohol use: Yes    Comment: occ  . Drug use: No    Home Medications Prior to Admission medications   Medication Sig Start Date End Date Taking? Authorizing Provider  albuterol (VENTOLIN HFA) 108 (90 Base) MCG/ACT inhaler Inhale 1-2 puffs into the lungs every  6 (six) hours as needed for wheezing or shortness of breath. 05/13/20   Mardella Layman, MD  metroNIDAZOLE (FLAGYL) 500 MG tablet Take 1 tablet (500 mg total) by mouth 2 (two) times daily. 05/14/20   Merrilee Jansky, MD    Allergies    Morphine and related  Review of Systems   Review of Systems  Respiratory: Positive for cough.   All other systems reviewed and are negative.   Physical Exam Updated Vital Signs BP 108/82 (BP Location: Left Arm)   Pulse 66   Temp 98.3 F (36.8 C) (Oral)   Resp 16   Ht 4\' 11"  (1.499 m)   Wt 61.2 kg   LMP 04/30/2020   SpO2 99%   BMI 27.27 kg/m   Physical Exam Vitals and nursing note reviewed.  HENT:     Head: Normocephalic.  Eyes:     Extraocular Movements: Extraocular movements intact.     Pupils: Pupils are equal, round, and reactive to light.  Cardiovascular:     Rate and Rhythm: Normal rate and regular rhythm.     Heart sounds: Normal heart  sounds.  Pulmonary:     Effort: Pulmonary effort is normal.     Breath sounds: Normal breath sounds.  Abdominal:     General: Bowel sounds are normal.     Palpations: Abdomen is soft.  Musculoskeletal:        General: Normal range of motion.     Cervical back: Normal range of motion.  Skin:    General: Skin is warm.     Capillary Refill: Capillary refill takes less than 2 seconds.  Neurological:     General: No focal deficit present.     Mental Status: She is alert and oriented to person, place, and time.  Psychiatric:        Mood and Affect: Mood normal.        Behavior: Behavior normal.     ED Results / Procedures / Treatments   Labs (all labs ordered are listed, but only abnormal results are displayed) Labs Reviewed  SARS CORONAVIRUS 2 BY RT PCR (HOSPITAL ORDER, PERFORMED IN [place])  BASIC METABOLIC PANEL  CBC  I-STAT BETA HCG BLOOD, ED ([facility], [facility], [facility] ONLY)  TROPONIN I (HIGH SENSITIVITY)  TROPONIN I (HIGH SENSITIVITY)    EKG EKG  Interpretation  Date/Time:  Friday May 21 2020 11:54:48 EDT Ventricular Rate:  84 PR Interval:  122 QRS Duration: 74 QT Interval:  340 QTC Calculation: 401 R Axis:   5 Text Interpretation: Normal sinus rhythm with sinus arrhythmia Normal ECG No significant change since last tracing Confirmed by Richardean Canal [phone]) on 05/21/2020 8:13:59 PM   Radiology DG Chest 2 View  Result Date: 05/21/2020 CLINICAL DATA:  LEFT-sided chest pain EXAM: CHEST - 2 VIEW COMPARISON:  December 28, 2017 FINDINGS: The cardiomediastinal silhouette is normal in contour. No pleural effusion. No pneumothorax. No acute pleuroparenchymal abnormality. Visualized abdomen is unremarkable. No acute osseous abnormality noted. IMPRESSION: No acute cardiopulmonary abnormality. Electronically Signed   By: Meda Klinefelter MD   On: 05/21/2020 12:38   CT Angio Chest PE W and/or Wo Contrast  Result Date: 05/21/2020 CLINICAL DATA:  Left-sided chest pressure EXAM: CT ANGIOGRAPHY CHEST WITH CONTRAST TECHNIQUE: Multidetector CT imaging of the chest was performed using the standard protocol during bolus administration of intravenous contrast. Multiplanar CT image reconstructions and MIPs were obtained to evaluate the vascular anatomy. CONTRAST:  OMNIPAQUE IOHEXOL 350 MG/ML SOLN COMPARISON:  None. FINDINGS: Cardiovascular: There is a optimal opacification of the pulmonary arteries. There is no central,segmental, or subsegmental filling defects within the pulmonary arteries. The heart is normal in size. No pericardial effusion or thickening. No evidence right heart strain. There is normal three-vessel brachiocephalic anatomy without proximal stenosis. The thoracic aorta is normal in appearance. Mediastinum/Nodes: No hilar, mediastinal, or axillary adenopathy. Thyroid gland, trachea, and esophagus demonstrate no significant findings. Lungs/Pleura: Rounded patchy airspace opacity seen at the posterior left lung base. The right lung is  clear. No pleural effusion. Upper Abdomen: No acute abnormalities present in the visualized portions of the upper abdomen. Musculoskeletal: No chest wall abnormality. No acute or significant osseous findings. Review of the MIP images confirms the above findings. IMPRESSION: No central, segmental, or subsegmental pulmonary embolism. Rounded patchy airspace opacity at the left lung base which could be due to infectious etiology or atelectasis Electronically Signed   By: Jonna Clark M.D.   On: 05/21/2020 21:06    Procedures Procedures (including critical care time)  Medications Ordered in ED Medications  sodium chloride 0.9 % bolus 1,000 mL (1,000  mLs Intravenous New Bag/Given 05/21/20 2020)  iohexol (OMNIPAQUE) 350 MG/ML injection 100 mL (100 mLs Intravenous Contrast Given 05/21/20 2055)    ED Course  I have reviewed the triage vital signs and the nursing notes.  Pertinent labs & imaging results that were available during my care of the patient were reviewed by me and considered in my medical decision making (see chart for details).    MDM Rules/Calculators/A&P                         Cambreigh Dearing is a 29 y.o. female here with SOB. Hx of PE but she is hemodynamically stable. Consider COVID as well, low suspicion for ACS. Will get labs, CTA chest, test for COVID.   10:33 PM Trop neg x 2.  At CT showed possible pneumonia vs atelectasis. COVID pending.  Told her to stay home until Covid result come back.  We will give a course of Z-Pak.  Final Clinical Impression(s) / ED Diagnoses Final diagnoses:  None    Rx / DC Orders ED Discharge Orders    None       Charlynne Pander, MD 05/21/20 2237

## 2020-05-21 NOTE — ED Notes (Signed)
Discharge instructions discussed with pt. Pt verbalized understanding with no questions at this time. Pt to go home with family.  °

## 2020-05-24 ENCOUNTER — Telehealth: Payer: Self-pay | Admitting: *Deleted

## 2020-05-24 NOTE — Telephone Encounter (Signed)
Pt called regarding which pharmacy Rx was e-scribed to.  RNCM reviewed chart to access After Visit Summary and found that Rx was printed.  Advised pt to take printed Rx to pharmacy of choice.  

## 2020-06-02 ENCOUNTER — Encounter: Payer: Self-pay | Admitting: Obstetrics and Gynecology

## 2020-06-02 ENCOUNTER — Ambulatory Visit (INDEPENDENT_AMBULATORY_CARE_PROVIDER_SITE_OTHER): Payer: Medicaid Other | Admitting: *Deleted

## 2020-06-02 ENCOUNTER — Other Ambulatory Visit: Payer: Self-pay

## 2020-06-02 DIAGNOSIS — Z3201 Encounter for pregnancy test, result positive: Secondary | ICD-10-CM | POA: Diagnosis not present

## 2020-06-02 LAB — POCT PREGNANCY, URINE: Preg Test, Ur: POSITIVE — AB

## 2020-06-03 ENCOUNTER — Telehealth (INDEPENDENT_AMBULATORY_CARE_PROVIDER_SITE_OTHER): Payer: Medicaid Other | Admitting: Obstetrics and Gynecology

## 2020-06-03 DIAGNOSIS — Z3201 Encounter for pregnancy test, result positive: Secondary | ICD-10-CM

## 2020-06-03 NOTE — Telephone Encounter (Signed)
Patient is requesting a call back because she is not feeling right.

## 2020-06-03 NOTE — Telephone Encounter (Signed)
Returned patient's call. She reports she is not feeling well. She reports she is having mild cramping in early pregnancy. She reports the cramping is in the center of the abdomen.   Reviewed it is normal for women to experience cramping as the uterus grows.   Reviewed Ectopic and miscarriage precautions and when and where to seek Emergency Care.   She is taking gummy PNV. She was on Lovenox for PE with last pregnancy.   Patient has not other questions or concerns. She has Prenatal appointments scheduled.

## 2020-06-06 ENCOUNTER — Other Ambulatory Visit: Payer: Self-pay

## 2020-06-06 ENCOUNTER — Inpatient Hospital Stay (HOSPITAL_COMMUNITY)
Admission: AD | Admit: 2020-06-06 | Discharge: 2020-06-06 | Disposition: A | Discharge status: Home / Self Care | Payer: Medicaid Other | Attending: Obstetrics & Gynecology | Admitting: Obstetrics & Gynecology

## 2020-06-06 ENCOUNTER — Encounter (HOSPITAL_COMMUNITY): Payer: Self-pay | Admitting: Obstetrics & Gynecology

## 2020-06-06 DIAGNOSIS — Z86711 Personal history of pulmonary embolism: Secondary | ICD-10-CM | POA: Insufficient documentation

## 2020-06-06 DIAGNOSIS — Z79899 Other long term (current) drug therapy: Secondary | ICD-10-CM | POA: Insufficient documentation

## 2020-06-06 DIAGNOSIS — Z87891 Personal history of nicotine dependence: Secondary | ICD-10-CM | POA: Insufficient documentation

## 2020-06-06 DIAGNOSIS — Z3A01 Less than 8 weeks gestation of pregnancy: Secondary | ICD-10-CM | POA: Insufficient documentation

## 2020-06-06 DIAGNOSIS — Z885 Allergy status to narcotic agent status: Secondary | ICD-10-CM | POA: Diagnosis not present

## 2020-06-06 DIAGNOSIS — Z7902 Long term (current) use of antithrombotics/antiplatelets: Secondary | ICD-10-CM | POA: Insufficient documentation

## 2020-06-06 DIAGNOSIS — O210 Mild hyperemesis gravidarum: Secondary | ICD-10-CM | POA: Diagnosis not present

## 2020-06-06 LAB — URINALYSIS, ROUTINE W REFLEX MICROSCOPIC
Bilirubin Urine: NEGATIVE
Glucose, UA: NEGATIVE mg/dL
Hgb urine dipstick: NEGATIVE
Ketones, ur: NEGATIVE mg/dL
Nitrite: NEGATIVE
Protein, ur: NEGATIVE mg/dL
Specific Gravity, Urine: 1.027 (ref 1.005–1.030)
pH: 6 (ref 5.0–8.0)

## 2020-06-06 MED ORDER — DOXYLAMINE-PYRIDOXINE 10-10 MG PO TBEC: DELAYED_RELEASE_TABLET | ORAL | 1 refills | Status: DC

## 2020-06-06 MED ORDER — METOCLOPRAMIDE HCL 10 MG PO TABS
10.0000 mg | ORAL_TABLET | Freq: Once | ORAL | Status: AC
  Administered 2020-06-06: 10 mg via ORAL
  Filled 2020-06-06: qty 1

## 2020-06-06 NOTE — Discharge Instructions (Signed)

## 2020-06-06 NOTE — MAU Provider Note (Signed)
Patient Carmen [name] is a 28 y.o. G2P1001 at [redacted]w[redacted]d by uncertain LMP here for nausea/vomiting. She denies vaginal bleeding, abdominal pain, pain with urination, any other ob-gyn complaints. She denies cough, fever, SOB, nasal congestion, loss of taste or smell.   She reports ongoing nausea, first episode of vomiting this morning. She says Diclegis worked for her in the past and she wants to start taking it again.  She is planning to start prenatal care at [facility]  History     CSN: [id number]  Arrival date and time: 06/06/20 1144   First Provider Initiated Contact with Patient 06/06/20 1238      Chief Complaint  Patient presents with  . Nausea   Emesis  This is a new problem. The current episode started today. The problem occurs less than 2 times per day. There has been no fever. Pertinent negatives include no abdominal pain, diarrhea, fever or headaches.   She reports lots of dry heaving; if she doesn't eat every 2-2.5 hours she gets sick. She says she drinks plenty of water; is able to keep water down.  OB History    Gravida  2   Para  1   Term  1   Preterm  0   AB  0   Living  1     SAB  0   TAB  0   Ectopic  0   Multiple  0   Live Births  1           Past Medical History:  Diagnosis Date  . Anemia   . Asthma   . Panic attacks   . Pulmonary embolism (HCC) 02/08/2018    Past Surgical History:  Procedure Laterality Date  . NO PAST SURGERIES      Family History  Problem Relation Age of Onset  . Pulmonary embolism Mother   . Ulcers Sister     Social History   Tobacco Use  . Smoking status: Former Smoker    Types: Cigarettes  . Smokeless tobacco: Never Used  Substance Use Topics  . Alcohol use: Yes    Comment: occ  . Drug use: No    Allergies:  Allergies  Allergen Reactions  . Morphine And Related Anaphylaxis, Swelling and Other (See Comments)    "swells shut" the patient's throat    Medications Prior to Admission  Medication Sig  Dispense Refill Last Dose  . albuterol (VENTOLIN HFA) 108 (90 Base) MCG/ACT inhaler Inhale 1-2 puffs into the lungs every 6 (six) hours as needed for wheezing or shortness of breath. 18 g 2   . azithromycin (ZITHROMAX) 250 MG tablet Take 1 tablet (250 mg total) by mouth daily. Take first 2 tablets together, then 1 every day until finished. 6 tablet 0   . metroNIDAZOLE (FLAGYL) 500 MG tablet Take 1 tablet (500 mg total) by mouth 2 (two) times daily. 14 tablet 0     Review of Systems  Constitutional: Negative.  Negative for fever.  HENT: Negative.   Respiratory: Negative.   Cardiovascular: Negative.   Gastrointestinal: Positive for nausea and vomiting. Negative for abdominal pain and diarrhea.  Neurological: Negative for headaches.   Physical Exam   Blood pressure 109/67, pulse 87, temperature 98.2 F (36.8 C), temperature source Oral, resp. rate 16, height 4\' 11"  (1.499 m), weight 58.3 kg, last menstrual period 04/19/2020, SpO2 100 %, unknown if currently breastfeeding.  Physical Exam Constitutional:      Appearance: Normal appearance.  HENT:  Head: Normocephalic.  Cardiovascular:     Rate and Rhythm: Normal rate.     Pulses: Normal pulses.  Neurological:     Mental Status: She is alert.     MAU Course  Procedures  MDM -will try Reglan PO; patient feels better and tolerated water here.  -UA is normal, no signs of dehydration. CBC and CMP not done.  Patient Vitals for the past 24 hrs:  BP Temp Temp src Pulse Resp SpO2 Height Weight  06/06/20 1448 (!) 98/59 -- -- 82 -- -- -- --  06/06/20 1215 109/67 98.2 F (36.8 C) Oral 87 16 100 % 4\' 11"  (1.499 m) 58.3 kg     Assessment and Plan   1. Morning sickness    2. Patient stable for discharge; feels better after nap in MAU and Reglan tablet.  No other acute events while in MAU and patient is talkative and ambulating in the room in no distress.  3. She desires a RX for Diclegis; will send home with RX and instructions on  how to take. Patient does not want COVID test, but does want documentation for her job that patient does not require COVID-19 testing. Letter provided.   4. Reviewed first trimester precautions and when to return to MAU; start care at [facility]. If needs to change anti-emetics, recommended that patient call clinic for change in RX, rather than visit MAU due to high circulation of COVID in the community. Return to MAU if bleeding, abdominal pain or unable to keep liquids down.    [facility] Carmen [name] 06/06/2020, 12:38 PM

## 2020-06-06 NOTE — MAU Note (Signed)
Carmen [name] is a 29 y.o. at [redacted]w[redacted]d here in MAU reporting: nausea for the past few days. Emesis x 1 in the past 24 hours. Denies bleeding and discharge. Has some mild cramping when she is at work moving. States no pain currently.  LMP: 04/19/20 approximately, pt reports it was the 2nd week of July  Onset of complaint: the past few days  Pain score: 0/10  Vitals:   06/06/20 1215  BP: 109/67  Pulse: 87  Resp: 16  Temp: 98.2 F (36.8 C)  SpO2: 100%     Lab orders placed from triage: UA

## 2020-06-14 ENCOUNTER — Encounter (HOSPITAL_COMMUNITY): Payer: Self-pay | Admitting: Obstetrics and Gynecology

## 2020-06-14 ENCOUNTER — Other Ambulatory Visit: Payer: Self-pay

## 2020-06-14 ENCOUNTER — Inpatient Hospital Stay (HOSPITAL_COMMUNITY)
Admission: AD | Admit: 2020-06-14 | Discharge: 2020-06-16 | DRG: 833 | Disposition: A | Discharge status: Home / Self Care | Payer: Medicaid Other | Attending: Obstetrics and Gynecology | Admitting: Obstetrics and Gynecology

## 2020-06-14 DIAGNOSIS — Z86711 Personal history of pulmonary embolism: Secondary | ICD-10-CM

## 2020-06-14 DIAGNOSIS — Z87891 Personal history of nicotine dependence: Secondary | ICD-10-CM

## 2020-06-14 DIAGNOSIS — Z3A08 8 weeks gestation of pregnancy: Secondary | ICD-10-CM

## 2020-06-14 DIAGNOSIS — Z8759 Personal history of other complications of pregnancy, childbirth and the puerperium: Secondary | ICD-10-CM

## 2020-06-14 DIAGNOSIS — O3680X0 Pregnancy with inconclusive fetal viability, not applicable or unspecified: Secondary | ICD-10-CM

## 2020-06-14 DIAGNOSIS — O210 Mild hyperemesis gravidarum: Principal | ICD-10-CM | POA: Diagnosis present

## 2020-06-14 DIAGNOSIS — Z20822 Contact with and (suspected) exposure to covid-19: Secondary | ICD-10-CM | POA: Diagnosis present

## 2020-06-14 DIAGNOSIS — R1110 Vomiting, unspecified: Secondary | ICD-10-CM | POA: Diagnosis present

## 2020-06-14 DIAGNOSIS — K117 Disturbances of salivary secretion: Secondary | ICD-10-CM | POA: Diagnosis present

## 2020-06-14 LAB — URINALYSIS, ROUTINE W REFLEX MICROSCOPIC
Bilirubin Urine: NEGATIVE
Glucose, UA: NEGATIVE mg/dL
Hgb urine dipstick: NEGATIVE
Ketones, ur: 5 mg/dL — AB
Leukocytes,Ua: NEGATIVE
Nitrite: NEGATIVE
Protein, ur: 30 mg/dL — AB
Specific Gravity, Urine: 1.026 (ref 1.005–1.030)
pH: 7 (ref 5.0–8.0)

## 2020-06-14 LAB — SARS CORONAVIRUS 2 BY RT PCR (HOSPITAL ORDER, PERFORMED IN ~~LOC~~ HOSPITAL LAB): SARS Coronavirus 2: NEGATIVE

## 2020-06-14 LAB — TYPE AND SCREEN
ABO/RH(D): O POS
Antibody Screen: NEGATIVE

## 2020-06-14 LAB — CBC
HCT: 37.4 % (ref 36.0–46.0)
Hemoglobin: 11.8 g/dL — ABNORMAL LOW (ref 12.0–15.0)
MCH: 28.1 pg (ref 26.0–34.0)
MCHC: 31.6 g/dL (ref 30.0–36.0)
MCV: 89 fL (ref 80.0–100.0)
Platelets: 220 10*3/uL (ref 150–400)
RBC: 4.2 MIL/uL (ref 3.87–5.11)
RDW: 13.2 % (ref 11.5–15.5)
WBC: 8.4 10*3/uL (ref 4.0–10.5)
nRBC: 0 % (ref 0.0–0.2)

## 2020-06-14 MED ORDER — METHYLPREDNISOLONE 4 MG PO TABS: 8.0000 mg | ORAL_TABLET | Freq: Every day | ORAL | Status: DC

## 2020-06-14 MED ORDER — PROMETHAZINE HCL 25 MG/ML IJ SOLN
12.5000 mg | Freq: Once | INTRAMUSCULAR | Status: AC
  Administered 2020-06-14: 12.5 mg via INTRAVENOUS
  Filled 2020-06-14: qty 1

## 2020-06-14 MED ORDER — METHYLPREDNISOLONE 4 MG PO TABS: 4.0000 mg | ORAL_TABLET | Freq: Every day | ORAL | Status: DC

## 2020-06-14 MED ORDER — METHYLPREDNISOLONE SODIUM SUCC 125 MG IJ SOLR
48.0000 mg | Freq: Once | INTRAMUSCULAR | Status: AC
  Administered 2020-06-14: 48 mg via INTRAVENOUS
  Filled 2020-06-14: qty 2

## 2020-06-14 MED ORDER — METHYLPREDNISOLONE 16 MG PO TABS
16.0000 mg | ORAL_TABLET | Freq: Every day | ORAL | Status: DC
  Administered 2020-06-15 – 2020-06-16 (×2): 16 mg via ORAL
  Filled 2020-06-14 (×2): qty 1

## 2020-06-14 MED ORDER — ACETAMINOPHEN 325 MG PO TABS: 650.0000 mg | ORAL_TABLET | ORAL | Status: DC | PRN

## 2020-06-14 MED ORDER — DOCUSATE SODIUM 100 MG PO CAPS
100.0000 mg | ORAL_CAPSULE | Freq: Every day | ORAL | Status: DC
  Administered 2020-06-15 – 2020-06-16 (×2): 100 mg via ORAL
  Filled 2020-06-14 (×2): qty 1

## 2020-06-14 MED ORDER — PRENATAL MULTIVITAMIN CH: 1.0000 | ORAL_TABLET | Freq: Every day | ORAL | Status: DC

## 2020-06-14 MED ORDER — GLYCOPYRROLATE 0.2 MG/ML IJ SOLN
0.2000 mg | Freq: Three times a day (TID) | INTRAMUSCULAR | Status: DC
  Administered 2020-06-14 – 2020-06-15 (×2): 0.2 mg via INTRAVENOUS
  Filled 2020-06-14 (×2): qty 1

## 2020-06-14 MED ORDER — METHYLPREDNISOLONE 16 MG PO TABS
16.0000 mg | ORAL_TABLET | Freq: Every day | ORAL | Status: DC
  Administered 2020-06-15: 16 mg via ORAL
  Filled 2020-06-14 (×2): qty 1

## 2020-06-14 MED ORDER — CALCIUM CARBONATE ANTACID 500 MG PO CHEW: 2.0000 | CHEWABLE_TABLET | ORAL | Status: DC | PRN

## 2020-06-14 MED ORDER — METOCLOPRAMIDE HCL 5 MG/ML IJ SOLN
10.0000 mg | Freq: Once | INTRAMUSCULAR | Status: AC
  Administered 2020-06-14: 10 mg via INTRAVENOUS
  Filled 2020-06-14: qty 2

## 2020-06-14 MED ORDER — PROMETHAZINE HCL 25 MG/ML IJ SOLN: 12.5000 mg | Freq: Four times a day (QID) | INTRAMUSCULAR | Status: DC | PRN

## 2020-06-14 MED ORDER — ENOXAPARIN SODIUM 40 MG/0.4ML ~~LOC~~ SOLN
40.0000 mg | SUBCUTANEOUS | Status: DC
  Administered 2020-06-14 – 2020-06-15 (×2): 40 mg via SUBCUTANEOUS
  Filled 2020-06-14 (×3): qty 0.4

## 2020-06-14 MED ORDER — METOCLOPRAMIDE HCL 5 MG/ML IJ SOLN
10.0000 mg | Freq: Four times a day (QID) | INTRAMUSCULAR | Status: DC
  Administered 2020-06-14 – 2020-06-16 (×7): 10 mg via INTRAVENOUS
  Filled 2020-06-14 (×7): qty 2

## 2020-06-14 MED ORDER — ENOXAPARIN SODIUM 40 MG/0.4ML ~~LOC~~ SOLN: 40.0000 mg | SUBCUTANEOUS | Status: DC

## 2020-06-14 MED ORDER — LACTATED RINGERS IV SOLN: INTRAVENOUS | Status: DC

## 2020-06-14 MED ORDER — GLYCOPYRROLATE 0.2 MG/ML IJ SOLN
0.1000 mg | Freq: Once | INTRAMUSCULAR | Status: AC
  Administered 2020-06-14: 0.1 mg via INTRAVENOUS
  Filled 2020-06-14: qty 1

## 2020-06-14 MED ORDER — LACTATED RINGERS IV BOLUS
1000.0000 mL | Freq: Once | INTRAVENOUS | Status: AC
  Administered 2020-06-14: 1000 mL via INTRAVENOUS

## 2020-06-14 MED ORDER — METHYLPREDNISOLONE 16 MG PO TABS
16.0000 mg | ORAL_TABLET | Freq: Every day | ORAL | Status: AC
  Administered 2020-06-15 – 2020-06-16 (×2): 16 mg via ORAL
  Filled 2020-06-14 (×2): qty 1

## 2020-06-14 MED ORDER — SODIUM CHLORIDE 0.9 % IV SOLN
8.0000 mg | Freq: Once | INTRAVENOUS | Status: DC
  Filled 2020-06-14: qty 4

## 2020-06-14 NOTE — MAU Note (Signed)
Pt reports having n/v x 2 weeks. Taking diglegis without relief. Not able to keep medication or food/liquids down. Having abd cramping and feeling weak.

## 2020-06-14 NOTE — H&P (Signed)
Chief Complaint:  Emesis   Carmen [name] is  29 y.o. G2P1001 at [redacted]w[redacted]d presents complaining of Emesis She was seen 8/29 w/same complaint, given rx for diclegis.  The diclegis has not quelled her nausea.  She "can't keep liquids down", has ptyalism, mucus in her throat.  In addition, she has (mainly) left side CP--she was worked up for this 8/13:  Neg Covid, no PE, ? Atelectasis vs pneumonia on CXR.  Given a zpack, sx remain.  Feels weak, general malaise. Has lost about 5lbs since last week.  Had a PE when pregnant with first baby.  Obstetrical/Gynecological History: OB History    Gravida  2   Para  1   Term  1   Preterm  0   AB  0   Living  1     SAB  0   TAB  0   Ectopic  0   Multiple  0   Live Births  1          Past Medical History: Past Medical History:  Diagnosis Date  . Anemia   . Asthma   . Panic attacks   . Pulmonary embolism (HCC) 02/08/2018    Past Surgical History: Past Surgical History:  Procedure Laterality Date  . NO PAST SURGERIES      Family History: Family History  Problem Relation Age of Onset  . Pulmonary embolism Mother   . Ulcers Sister     Social History: Social History   Tobacco Use  . Smoking status: Former Smoker    Types: Cigarettes  . Smokeless tobacco: Never Used  Substance Use Topics  . Alcohol use: Yes    Comment: occ  . Drug use: No    Allergies:  Allergies  Allergen Reactions  . Morphine And Related Anaphylaxis, Swelling and Other (See Comments)    "swells shut" the patient's throat    Meds:  Medications Prior to Admission  Medication Sig Dispense Refill Last Dose  . albuterol (VENTOLIN HFA) 108 (90 Base) MCG/ACT inhaler Inhale 1-2 puffs into the lungs every 6 (six) hours as needed for wheezing or shortness of breath. 18 g 2   . Doxylamine-Pyridoxine 10-10 MG TBEC Take one tablet in the morning, one in midafternoon, and two at bedtime. Take on empty stomach with a glass of water. 60 tablet 1     Review  of Systems   Constitutional: Negative for fever and chills Eyes: Negative for visual disturbances Respiratory: Negative for shortness of breath, dyspnea Cardiovascular: Negative for palpitations  Gastrointestinal: Negative for diarrhea and constipation Genitourinary: Negative for dysuria and urgency Musculoskeletal: Feels weak, normal ROM  Neurological: Negative for dizziness and headaches    Physical Exam  Blood pressure 97/66, pulse 85, temperature 98.9 F (37.2 C), resp. rate 18, weight 56.7 kg, last menstrual period 04/19/2020, unknown if currently breastfeeding. GENERAL: Well-developed, well-nourished female in no acute distress.  LUNGS: Normal respiratory effort. LCTAB, O2 sat 100% HEART: Regular rate and rhythm. ABDOMEN: Soft, nontender, nondistended EXTREMITIES: Nontender, no edema, 2+ distal pulses.   Labs: Results for orders placed or performed during the hospital encounter of 06/14/20 (from the past 24 hour(s))  Urinalysis, Routine w reflex microscopic Urine, Clean Catch   Collection Time: 06/14/20  2:02 PM  Result Value Ref Range   Color, Urine YELLOW YELLOW   APPearance HAZY (A) CLEAR   Specific Gravity, Urine 1.026 1.005 - 1.030   pH 7.0 5.0 - 8.0   Glucose, UA NEGATIVE NEGATIVE mg/dL  Hgb urine dipstick NEGATIVE NEGATIVE   Bilirubin Urine NEGATIVE NEGATIVE   Ketones, ur 5 (A) NEGATIVE mg/dL   Protein, ur 30 (A) NEGATIVE mg/dL   Nitrite NEGATIVE NEGATIVE   Leukocytes,Ua NEGATIVE NEGATIVE   RBC / HPF 0-5 0 - 5 RBC/hpf   WBC, UA 0-5 0 - 5 WBC/hpf   Bacteria, UA RARE (A) NONE SEEN   Squamous Epithelial / LPF 6-10 0 - 5   Mucus PRESENT    Imaging Studies:  DG Chest 2 View  Result Date: 05/21/2020 CLINICAL DATA:  LEFT-sided chest pain EXAM: CHEST - 2 VIEW COMPARISON:  December 28, 2017 FINDINGS: The cardiomediastinal silhouette is normal in contour. No pleural effusion. No pneumothorax. No acute pleuroparenchymal abnormality. Visualized abdomen is unremarkable.  No acute osseous abnormality noted. IMPRESSION: No acute cardiopulmonary abnormality. Electronically Signed   By: Meda Klinefelter MD   On: 05/21/2020 12:38   CT Angio Chest PE W and/or Wo Contrast  Result Date: 05/21/2020 CLINICAL DATA:  Left-sided chest pressure EXAM: CT ANGIOGRAPHY CHEST WITH CONTRAST TECHNIQUE: Multidetector CT imaging of the chest was performed using the standard protocol during bolus administration of intravenous contrast. Multiplanar CT image reconstructions and MIPs were obtained to evaluate the vascular anatomy. CONTRAST:  OMNIPAQUE IOHEXOL 350 MG/ML SOLN COMPARISON:  None. FINDINGS: Cardiovascular: There is a optimal opacification of the pulmonary arteries. There is no central,segmental, or subsegmental filling defects within the pulmonary arteries. The heart is normal in size. No pericardial effusion or thickening. No evidence right heart strain. There is normal three-vessel brachiocephalic anatomy without proximal stenosis. The thoracic aorta is normal in appearance. Mediastinum/Nodes: No hilar, mediastinal, or axillary adenopathy. Thyroid gland, trachea, and esophagus demonstrate no significant findings. Lungs/Pleura: Rounded patchy airspace opacity seen at the posterior left lung base. The right lung is clear. No pleural effusion. Upper Abdomen: No acute abnormalities present in the visualized portions of the upper abdomen. Musculoskeletal: No chest wall abnormality. No acute or significant osseous findings. Review of the MIP images confirms the above findings. IMPRESSION: No central, segmental, or subsegmental pulmonary embolism. Rounded patchy airspace opacity at the left lung base which could be due to infectious etiology or atelectasis Electronically Signed   By: Jonna Clark M.D.   On: 05/21/2020 21:06    MDM/A&P   Carmen [name] is  29 y.o. G2P1001 at [redacted]w[redacted]d presents with N/V, weakness, malaise.  Plan: Check for Covid (is unvaccinated, has received proper  counseling in previous visits) IVF/meds for nausea/ptyalism  4:29 PM Hasn't vomited since meds, but still very nauseated.  Will try IV Reglan.  7:39 PM Still feels terrible and appears ill. Reglan worked better than the phenergan. Discussed w/Dr. Donavan Foil.  Will admit to obs and start steroid taper, robinul, scheduled reglan w/prn phenergan.  Pt declined zofran d/t possible SE to baby.   Carmen [name] 9/6/20214:29 PM

## 2020-06-14 NOTE — MAU Provider Note (Signed)
None     Chief Complaint:  Emesis   Carmen [name] is  29 y.o. G2P1001 at [redacted]w[redacted]d presents complaining of Emesis She was seen 8/29 w/same complaint, given rx for diclegis.  The diclegis has not quelled her nausea.  She "can't keep liquids down", has ptyalism, mucus in her throat.  In addition, she has (mainly) left side CP--she was worked up for this 8/13:  Neg Covid, no PE, ? Atelectasis vs pneumonia on CXR.  Given a zpack, sx remain.  Feels weak, general malaise.   Obstetrical/Gynecological History: OB History    Gravida  2   Para  1   Term  1   Preterm  0   AB  0   Living  1     SAB  0   TAB  0   Ectopic  0   Multiple  0   Live Births  1          Past Medical History: Past Medical History:  Diagnosis Date  . Anemia   . Asthma   . Panic attacks   . Pulmonary embolism (HCC) 02/08/2018    Past Surgical History: Past Surgical History:  Procedure Laterality Date  . NO PAST SURGERIES      Family History: Family History  Problem Relation Age of Onset  . Pulmonary embolism Mother   . Ulcers Sister     Social History: Social History   Tobacco Use  . Smoking status: Former Smoker    Types: Cigarettes  . Smokeless tobacco: Never Used  Substance Use Topics  . Alcohol use: Yes    Comment: occ  . Drug use: No    Allergies:  Allergies  Allergen Reactions  . Morphine And Related Anaphylaxis, Swelling and Other (See Comments)    "swells shut" the patient's throat    Meds:  Medications Prior to Admission  Medication Sig Dispense Refill Last Dose  . albuterol (VENTOLIN HFA) 108 (90 Base) MCG/ACT inhaler Inhale 1-2 puffs into the lungs every 6 (six) hours as needed for wheezing or shortness of breath. 18 g 2   . Doxylamine-Pyridoxine 10-10 MG TBEC Take one tablet in the morning, one in midafternoon, and two at bedtime. Take on empty stomach with a glass of water. 60 tablet 1     Review of Systems   Constitutional: Negative for fever and  chills Eyes: Negative for visual disturbances Respiratory: Negative for shortness of breath, dyspnea Cardiovascular: Negative for palpitations  Gastrointestinal: Negative for diarrhea and constipation Genitourinary: Negative for dysuria and urgency Musculoskeletal: Feels weak, normal ROM  Neurological: Negative for dizziness and headaches    Physical Exam  Blood pressure 97/66, pulse 85, temperature 98.9 F (37.2 C), resp. rate 18, weight 56.7 kg, last menstrual period 04/19/2020, unknown if currently breastfeeding. GENERAL: Well-developed, well-nourished female in no acute distress.  LUNGS: Normal respiratory effort. LCTAB, O2 sat 100% HEART: Regular rate and rhythm. ABDOMEN: Soft, nontender, nondistended EXTREMITIES: Nontender, no edema, 2+ distal pulses.   Labs: Results for orders placed or performed during the hospital encounter of 06/14/20 (from the past 24 hour(s))  Urinalysis, Routine w reflex microscopic Urine, Clean Catch   Collection Time: 06/14/20  2:02 PM  Result Value Ref Range   Color, Urine YELLOW YELLOW   APPearance HAZY (A) CLEAR   Specific Gravity, Urine 1.026 1.005 - 1.030   pH 7.0 5.0 - 8.0   Glucose, UA NEGATIVE NEGATIVE mg/dL   Hgb urine dipstick NEGATIVE NEGATIVE   Bilirubin Urine NEGATIVE  NEGATIVE   Ketones, ur 5 (A) NEGATIVE mg/dL   Protein, ur 30 (A) NEGATIVE mg/dL   Nitrite NEGATIVE NEGATIVE   Leukocytes,Ua NEGATIVE NEGATIVE   RBC / HPF 0-5 0 - 5 RBC/hpf   WBC, UA 0-5 0 - 5 WBC/hpf   Bacteria, UA RARE (A) NONE SEEN   Squamous Epithelial / LPF 6-10 0 - 5   Mucus PRESENT    Imaging Studies:  DG Chest 2 View  Result Date: 05/21/2020 CLINICAL DATA:  LEFT-sided chest pain EXAM: CHEST - 2 VIEW COMPARISON:  December 28, 2017 FINDINGS: The cardiomediastinal silhouette is normal in contour. No pleural effusion. No pneumothorax. No acute pleuroparenchymal abnormality. Visualized abdomen is unremarkable. No acute osseous abnormality noted. IMPRESSION: No  acute cardiopulmonary abnormality. Electronically Signed   By: Meda Klinefelter MD   On: 05/21/2020 12:38   CT Angio Chest PE W and/or Wo Contrast  Result Date: 05/21/2020 CLINICAL DATA:  Left-sided chest pressure EXAM: CT ANGIOGRAPHY CHEST WITH CONTRAST TECHNIQUE: Multidetector CT imaging of the chest was performed using the standard protocol during bolus administration of intravenous contrast. Multiplanar CT image reconstructions and MIPs were obtained to evaluate the vascular anatomy. CONTRAST:  OMNIPAQUE IOHEXOL 350 MG/ML SOLN COMPARISON:  None. FINDINGS: Cardiovascular: There is a optimal opacification of the pulmonary arteries. There is no central,segmental, or subsegmental filling defects within the pulmonary arteries. The heart is normal in size. No pericardial effusion or thickening. No evidence right heart strain. There is normal three-vessel brachiocephalic anatomy without proximal stenosis. The thoracic aorta is normal in appearance. Mediastinum/Nodes: No hilar, mediastinal, or axillary adenopathy. Thyroid gland, trachea, and esophagus demonstrate no significant findings. Lungs/Pleura: Rounded patchy airspace opacity seen at the posterior left lung base. The right lung is clear. No pleural effusion. Upper Abdomen: No acute abnormalities present in the visualized portions of the upper abdomen. Musculoskeletal: No chest wall abnormality. No acute or significant osseous findings. Review of the MIP images confirms the above findings. IMPRESSION: No central, segmental, or subsegmental pulmonary embolism. Rounded patchy airspace opacity at the left lung base which could be due to infectious etiology or atelectasis Electronically Signed   By: Jonna Clark M.D.   On: 05/21/2020 21:06    MDM/A&P   Carmen [name] is  29 y.o. G2P1001 at [redacted]w[redacted]d presents with N/V, weakness, malaise.  Plan: Check for Covid (is unvaccinated, has received proper counseling in previous visits) IVF/meds for  nausea/ptyalism  4:29 PM Hasn't vomited since meds, but still very nauseated.  Will try IV Reglan.  Carmen [name] 9/6/20214:29 PM  Pt admitted to OBS--see H&P

## 2020-06-15 ENCOUNTER — Observation Stay (HOSPITAL_COMMUNITY): Payer: Medicaid Other

## 2020-06-15 DIAGNOSIS — Z86711 Personal history of pulmonary embolism: Secondary | ICD-10-CM | POA: Diagnosis not present

## 2020-06-15 DIAGNOSIS — R1110 Vomiting, unspecified: Secondary | ICD-10-CM | POA: Diagnosis present

## 2020-06-15 DIAGNOSIS — O208 Other hemorrhage in early pregnancy: Secondary | ICD-10-CM | POA: Diagnosis not present

## 2020-06-15 DIAGNOSIS — Z20822 Contact with and (suspected) exposure to covid-19: Secondary | ICD-10-CM | POA: Diagnosis not present

## 2020-06-15 DIAGNOSIS — Z87891 Personal history of nicotine dependence: Secondary | ICD-10-CM | POA: Diagnosis not present

## 2020-06-15 DIAGNOSIS — O210 Mild hyperemesis gravidarum: Secondary | ICD-10-CM | POA: Diagnosis not present

## 2020-06-15 DIAGNOSIS — Z3A08 8 weeks gestation of pregnancy: Secondary | ICD-10-CM | POA: Diagnosis not present

## 2020-06-15 DIAGNOSIS — Z3A01 Less than 8 weeks gestation of pregnancy: Secondary | ICD-10-CM | POA: Diagnosis not present

## 2020-06-15 DIAGNOSIS — Z8759 Personal history of other complications of pregnancy, childbirth and the puerperium: Secondary | ICD-10-CM | POA: Diagnosis not present

## 2020-06-15 DIAGNOSIS — O26891 Other specified pregnancy related conditions, first trimester: Secondary | ICD-10-CM | POA: Diagnosis not present

## 2020-06-15 LAB — COMPREHENSIVE METABOLIC PANEL
ALT: 12 U/L (ref 0–44)
AST: 14 U/L — ABNORMAL LOW (ref 15–41)
Albumin: 3.1 g/dL — ABNORMAL LOW (ref 3.5–5.0)
Alkaline Phosphatase: 50 U/L (ref 38–126)
Anion gap: 7 (ref 5–15)
BUN: 7 mg/dL (ref 6–20)
CO2: 23 mmol/L (ref 22–32)
Calcium: 9 mg/dL (ref 8.9–10.3)
Chloride: 106 mmol/L (ref 98–111)
Creatinine, Ser: 0.62 mg/dL (ref 0.44–1.00)
GFR calc Af Amer: >60 mL/min (ref >60)
GFR calc non Af Amer: >60 mL/min (ref >60)
Glucose, Bld: 93 mg/dL (ref 70–99)
Potassium: 4 mmol/L (ref 3.5–5.1)
Sodium: 136 mmol/L (ref 135–145)
Total Bilirubin: 0.9 mg/dL (ref 0.3–1.2)
Total Protein: 6 g/dL — ABNORMAL LOW (ref 6.5–8.1)

## 2020-06-15 LAB — GLUCOSE, CAPILLARY: Glucose-Capillary: 105 mg/dL — ABNORMAL HIGH (ref 70–99)

## 2020-06-15 LAB — GLUCOSE, RANDOM: Glucose, Bld: 100 mg/dL — ABNORMAL HIGH (ref 70–99)

## 2020-06-15 MED ORDER — GLYCOPYRROLATE 1 MG PO TABS: 2.0000 mg | ORAL_TABLET | Freq: Three times a day (TID) | ORAL | Status: DC | PRN

## 2020-06-15 MED ORDER — VITAMIN B-6 25 MG PO TABS
25.0000 mg | ORAL_TABLET | Freq: Four times a day (QID) | ORAL | Status: DC
  Administered 2020-06-15 – 2020-06-16 (×6): 25 mg via ORAL
  Filled 2020-06-15 (×6): qty 1

## 2020-06-15 MED ORDER — PROCHLORPERAZINE MALEATE 10 MG PO TABS
10.0000 mg | ORAL_TABLET | Freq: Four times a day (QID) | ORAL | Status: DC | PRN
  Filled 2020-06-15: qty 1

## 2020-06-15 NOTE — Progress Notes (Signed)
FACULTY PRACTICE ANTEPARTUM PROGRESS NOTE  Carmen [name] is a 29 y.o. G2P1001 at [redacted]w[redacted]d who is admitted for hyperemesis.  Estimated Date of Delivery: 01/24/21 Fetal presentation is n/a.  Length of Stay:  0 Days. Admitted 06/14/2020  Subjective: Pt seen.  She was resting quietly and noted no further vomiting while admitted.  She still feels nauseated.  Per pt she has tolerated sips of sprite. She denies bleeding and leaking of fluid per vagina.  Vitals:  Blood pressure 104/71, pulse (!) 112, temperature 98 F (36.7 C), temperature source Oral, resp. rate 15, height 4\' 11"  (1.499 m), weight 57.2 kg, last menstrual period 04/19/2020, SpO2 100 %, unknown if currently breastfeeding. Physical Examination: CONSTITUTIONAL: Well-developed, well-nourished female in no acute distress.  HENT:  Normocephalic, atraumatic, External right and left ear normal. Oropharynx is clear and moist EYES: Conjunctivae and EOM are normal. Pupils are equal, round, and reactive to light. No scleral icterus.  NECK: Normal range of motion, supple, no masses. SKIN: Skin is warm and dry. No rash noted. Not diaphoretic. No erythema. No pallor. NEUROLGIC: Alert and oriented to person, place, and time. Normal reflexes, muscle tone coordination. No cranial nerve deficit noted. PSYCHIATRIC: Normal mood and affect. Normal behavior. Normal judgment and thought content. CARDIOVASCULAR: Mildly tachycardic heart rate noted, regular rhythm RESPIRATORY: Effort and breath sounds normal, no problems with respiration noted MUSCULOSKELETAL: Normal range of motion. No edema and no tenderness. ABDOMEN: Soft, nontender, nondistended,  CERVIX: deferred    Results for orders placed or performed during the hospital encounter of 06/14/20 (from the past 48 hour(s))  Urinalysis, Routine w reflex microscopic Urine, Clean Catch     Status: Abnormal   Collection Time: 06/14/20  2:02 PM  Result Value Ref Range   Color, Urine YELLOW YELLOW    APPearance HAZY (A) CLEAR   Specific Gravity, Urine 1.026 1.005 - 1.030   pH 7.0 5.0 - 8.0   Glucose, UA NEGATIVE NEGATIVE mg/dL   Hgb urine dipstick NEGATIVE NEGATIVE   Bilirubin Urine NEGATIVE NEGATIVE   Ketones, ur 5 (A) NEGATIVE mg/dL   Protein, ur 30 (A) NEGATIVE mg/dL   Nitrite NEGATIVE NEGATIVE   Leukocytes,Ua NEGATIVE NEGATIVE   RBC / HPF 0-5 0 - 5 RBC/hpf   WBC, UA 0-5 0 - 5 WBC/hpf   Bacteria, UA RARE (A) NONE SEEN   Squamous Epithelial / LPF 6-10 0 - 5   Mucus PRESENT     Comment: Performed at [facility], [address], [place], [place]  SARS Coronavirus 2 by RT PCR (hospital order, performed in [facility] hospital lab) Nasopharyngeal Nasopharyngeal Swab     Status: None   Collection Time: 06/14/20  3:51 PM   Specimen: Nasopharyngeal Swab  Result Value Ref Range   SARS Coronavirus 2 NEGATIVE NEGATIVE    Comment: (NOTE) SARS-CoV-2 target nucleic acids are NOT DETECTED.  The SARS-CoV-2 RNA is generally detectable in upper and lower respiratory specimens during the acute phase of infection. The lowest concentration of SARS-CoV-2 viral copies this assay can detect is 250 copies / mL. A negative result does not preclude SARS-CoV-2 infection and should not be used as the sole basis for treatment or other patient management decisions.  A negative result may occur with improper specimen collection / handling, submission of specimen other than nasopharyngeal swab, presence of viral mutation(s) within the areas targeted by this assay, and inadequate number of viral copies (<250 copies / mL). A negative result must be combined with  clinical observations, patient history, and epidemiological information.  Fact Sheet for Patients:   BoilerBrush.com.cy  Fact Sheet for Healthcare Providers: https://pope.com/  This test is not yet approved or  cleared by the [place] FDA and has been authorized for detection  and/or diagnosis of SARS-CoV-2 by FDA under an Emergency Use Authorization (EUA).  This EUA will remain in effect (meaning this test can be used) for the duration of the COVID-19 declaration under Section 564(b)(1) of the Act, 21 U.S.C. section 360bbb-3(b)(1), unless the authorization is terminated or revoked sooner.  Performed at [facility], [address] [place], [place] [address]   Type and screen [facility]     Status: None   Collection Time: 06/14/20  7:23 PM  Result Value Ref Range   ABO/RH(D) O POS    Antibody Screen NEG    Sample Expiration      06/17/2020,2359 Performed at [facility], [address] [place], [place] [address]   CBC     Status: Abnormal   Collection Time: 06/14/20  8:38 PM  Result Value Ref Range   WBC 8.4 4.0 - 10.5 K/uL   RBC 4.20 3.87 - 5.11 MIL/uL   Hemoglobin 11.8 (L) 12.0 - 15.0 g/dL   HCT 56.3 36 - 46 %   MCV 89.0 80.0 - 100.0 fL   MCH 28.1 26.0 - 34.0 pg   MCHC 31.6 30.0 - 36.0 g/dL   RDW 14.9 70.2 - 63.7 %   Platelets 220 150 - 400 K/uL   nRBC 0.0 0.0 - 0.2 %    Comment: Performed at [facility], [address] [place], [place] [address]  Glucose, capillary     Status: Abnormal   Collection Time: 06/15/20  5:04 AM  Result Value Ref Range   Glucose-Capillary 105 (H) 70 - 99 mg/dL    Comment: Glucose reference range applies only to samples taken after fasting for at least 8 hours.    I have reviewed the patient's current medications.  ASSESSMENT: Active Problems:   Hyperemesis affecting pregnancy, antepartum   PLAN:  Continue current medications Check CMP Add vit B6 25 mg q 6 hours po Discussed BRAT diet lovenox prophylaxis due to hx of PE. Attempt to advance diet if possible at lunch Pt will need to establish care once discharged.   Continue routine antenatal care.   Mariel Aloe, MD [facility] Faculty Attending, Center for [facility] 06/15/2020 6:49 AM

## 2020-06-15 NOTE — Progress Notes (Signed)
Faculty Practice OB/GYN Attending Note  US OB Comp Less 14 Wks  Result Date: 06/15/2020 CLINICAL DATA:  Hyperemesis, pregnancy of uncertain fetal viability, LMP 04/19/2020 EXAM: OBSTETRIC <14 WK ULTRASOUND TECHNIQUE: Transabdominal ultrasound was performed for evaluation of the gestation as well as the maternal uterus and adnexal regions. COMPARISON:  None FINDINGS: Intrauterine gestational sac: Present, single Yolk sac:  Present Embryo:  Present Cardiac Activity: Present Heart Rate: 130 bpm CRL:   9.0 mm   6 w 6 d                  [place] EDC: 02/02/2021 Subchorionic hemorrhage:  2 small subchronic hemorrhages Maternal uterus/adnexae: LEFT ovary normal size and morphology, 2.7 x 4.4 x 2.7 cm. RIGHT ovary normal size and morphology, 4.1 x 2.0 x 2.8 cm. No free pelvic fluid or adnexal masses. IMPRESSION: Single live intrauterine gestation at 6 weeks 6 days EGA. Two small subchronic hemorrhages. Electronically Signed   By: Ulyses Southward M.D.   On: 06/15/2020 09:18   EDD changed givne that it is not consistent with LMP.  Will continue antiemetics, ADAT as scheduled.   Jaynie Collins, MD, FACOG Obstetrician & Gynecologist, [facility] for [employer], [facility]

## 2020-06-16 ENCOUNTER — Encounter (HOSPITAL_COMMUNITY): Payer: Self-pay | Admitting: Obstetrics and Gynecology

## 2020-06-16 DIAGNOSIS — O210 Mild hyperemesis gravidarum: Principal | ICD-10-CM

## 2020-06-16 DIAGNOSIS — Z8759 Personal history of other complications of pregnancy, childbirth and the puerperium: Secondary | ICD-10-CM

## 2020-06-16 DIAGNOSIS — Z3A01 Less than 8 weeks gestation of pregnancy: Secondary | ICD-10-CM | POA: Diagnosis not present

## 2020-06-16 DIAGNOSIS — Z86711 Personal history of pulmonary embolism: Secondary | ICD-10-CM | POA: Diagnosis not present

## 2020-06-16 DIAGNOSIS — O26891 Other specified pregnancy related conditions, first trimester: Secondary | ICD-10-CM | POA: Diagnosis not present

## 2020-06-16 DIAGNOSIS — K117 Disturbances of salivary secretion: Secondary | ICD-10-CM | POA: Diagnosis present

## 2020-06-16 LAB — GLUCOSE, RANDOM: Glucose, Bld: 95 mg/dL (ref 70–99)

## 2020-06-16 LAB — GLUCOSE, CAPILLARY: Glucose-Capillary: 92 mg/dL (ref 70–99)

## 2020-06-16 MED ORDER — PROCHLORPERAZINE MALEATE 10 MG PO TABS: 10.0000 mg | ORAL_TABLET | Freq: Four times a day (QID) | ORAL | 0 refills | Status: DC | PRN

## 2020-06-16 MED ORDER — GLYCOPYRROLATE 1 MG PO TABS
2.0000 mg | ORAL_TABLET | Freq: Three times a day (TID) | ORAL | 2 refills | Status: DC | PRN
Start: 2020-06-16 — End: 2020-07-15

## 2020-06-16 MED ORDER — METOCLOPRAMIDE HCL 10 MG PO TABS: 10.0000 mg | ORAL_TABLET | Freq: Four times a day (QID) | ORAL | 2 refills | Status: DC | PRN

## 2020-06-16 MED ORDER — PANTOPRAZOLE SODIUM 20 MG PO TBEC: 20.0000 mg | DELAYED_RELEASE_TABLET | Freq: Every day | ORAL | 1 refills | Status: DC

## 2020-06-16 MED ORDER — ENOXAPARIN SODIUM 40 MG/0.4ML ~~LOC~~ SOLN: 40.0000 mg | SUBCUTANEOUS | 2 refills | Status: DC

## 2020-06-16 MED ORDER — METHYLPREDNISOLONE 4 MG PO TABS: ORAL_TABLET | ORAL | 0 refills | Status: DC

## 2020-06-16 MED ORDER — PROMETHAZINE HCL 25 MG RE SUPP: 25.0000 mg | Freq: Four times a day (QID) | RECTAL | 0 refills | Status: DC | PRN

## 2020-06-16 MED FILL — GLYCOPYRROLATE 1 MG TABLET: 1 | 7 days supply | Qty: 30 | Fill #0

## 2020-06-16 MED FILL — PROCHLORPERAZINE 10 MG TAB: 10 | 7 days supply | Qty: 30 | Fill #0

## 2020-06-16 MED FILL — ENOXAPARIN 40 MG/0.4 ML SYR: 40 | 30 days supply | Qty: 12 | Fill #0

## 2020-06-16 MED FILL — METOCLOPRAMIDE 10 MG TABLET: 10 | 7 days supply | Qty: 30 | Fill #0

## 2020-06-16 MED FILL — METHYLPREDNISOLONE 4 MG TAB: 4 | 10 days supply | Qty: 60 | Fill #0

## 2020-06-16 MED FILL — PANTOPRAZOLE SOD DR 20 MG T: 20 | 30 days supply | Qty: 30 | Fill #0

## 2020-06-16 MED FILL — PROMETHAZINE HCL 25 MG SUPP: 25 | 3 days supply | Qty: 12 | Fill #0

## 2020-06-16 NOTE — Discharge Summary (Addendum)
Physician Discharge Summary  Patient ID: Dandrea Medders MRN: [id number] DOB/AGE: 11/26/1990 29 y.o.  Admit date: 06/14/2020 Discharge date: 06/16/2020  Admission and  Discharge Diagnoses:  Principal Problem:   Hyperemesis affecting pregnancy, antepartum Active Problems:   History of pulmonary embolus during previous pregnancy in 2019   Ptyalism   [redacted] weeks gestation of pregnancy  Discharged Condition: Good  Hospital Course: Patient was admitted with hyperemesis and ptyalism. She has multiple ER visits and failed outpatient management.  Unable to tolerate any fluids at home, and no food intake for several days. She was given multipledifferent antiemetics and Robinul, started on Medrol taper.  Received a lot of IV and oral hydration.  Symptoms slowly improved, and was able to advance to have her tolerate clears and a little solid diet by time of discharge.  Of note, she was started on Lovenox given history of PE in prior pregnancy.  She also underwent ultrasound which showed her to be [redacted] weeks gestation on day of discharge.  No other concerns. Already has a NOB appointment later this month at [facility].  Consults: None  Significant Diagnostic Studies: DG Chest 2 View  Result Date: 05/21/2020 CLINICAL DATA:  LEFT-sided chest pain EXAM: CHEST - 2 VIEW COMPARISON:  December 28, 2017 FINDINGS: The cardiomediastinal silhouette is normal in contour. No pleural effusion. No pneumothorax. No acute pleuroparenchymal abnormality. Visualized abdomen is unremarkable. No acute osseous abnormality noted. IMPRESSION: No acute cardiopulmonary abnormality. Electronically Signed   By: Meda Klinefelter MD   On: 05/21/2020 12:38   CT Angio Chest PE W and/or Wo Contrast  Result Date: 05/21/2020 CLINICAL DATA:  Left-sided chest pressure EXAM: CT ANGIOGRAPHY CHEST WITH CONTRAST TECHNIQUE: Multidetector CT imaging of the chest was performed using the standard protocol during bolus administration of intravenous contrast.  Multiplanar CT image reconstructions and MIPs were obtained to evaluate the vascular anatomy. CONTRAST:  OMNIPAQUE IOHEXOL 350 MG/ML SOLN COMPARISON:  None. FINDINGS: Cardiovascular: There is a optimal opacification of the pulmonary arteries. There is no central,segmental, or subsegmental filling defects within the pulmonary arteries. The heart is normal in size. No pericardial effusion or thickening. No evidence right heart strain. There is normal three-vessel brachiocephalic anatomy without proximal stenosis. The thoracic aorta is normal in appearance. Mediastinum/Nodes: No hilar, mediastinal, or axillary adenopathy. Thyroid gland, trachea, and esophagus demonstrate no significant findings. Lungs/Pleura: Rounded patchy airspace opacity seen at the posterior left lung base. The right lung is clear. No pleural effusion. Upper Abdomen: No acute abnormalities present in the visualized portions of the upper abdomen. Musculoskeletal: No chest wall abnormality. No acute or significant osseous findings. Review of the MIP images confirms the above findings. IMPRESSION: No central, segmental, or subsegmental pulmonary embolism. Rounded patchy airspace opacity at the left lung base which could be due to infectious etiology or atelectasis Electronically Signed   By: Jonna Clark M.D.   On: 05/21/2020 21:06   US OB Comp Less 14 Wks  Result Date: 06/15/2020 CLINICAL DATA:  Hyperemesis, pregnancy of uncertain fetal viability, LMP 04/19/2020 EXAM: OBSTETRIC <14 WK ULTRASOUND TECHNIQUE: Transabdominal ultrasound was performed for evaluation of the gestation as well as the maternal uterus and adnexal regions. COMPARISON:  None FINDINGS: Intrauterine gestational sac: Present, single Yolk sac:  Present Embryo:  Present Cardiac Activity: Present Heart Rate: 130 bpm CRL:   9.0 mm   6 w 6 d                  [place] EDC: 02/02/2021 Subchorionic hemorrhage:  2 small subchronic hemorrhages Maternal uterus/adnexae: LEFT ovary normal  size and morphology, 2.7 x 4.4 x 2.7 cm. RIGHT ovary normal size and morphology, 4.1 x 2.0 x 2.8 cm. No free pelvic fluid or adnexal masses. IMPRESSION: Single live intrauterine gestation at 6 weeks 6 days EGA. Two small subchronic hemorrhages. Electronically Signed   By: Ulyses SouthwardMark  Boles M.D.   On: 06/15/2020 09:18   Results for orders placed or performed during the hospital encounter of 06/14/20 (from the past 72 hour(s))  Urinalysis, Routine w reflex microscopic Urine, Clean Catch     Status: Abnormal   Collection Time: 06/14/20  2:02 PM  Result Value Ref Range   Color, Urine YELLOW YELLOW   APPearance HAZY (A) CLEAR   Specific Gravity, Urine 1.026 1.005 - 1.030   pH 7.0 5.0 - 8.0   Glucose, UA NEGATIVE NEGATIVE mg/dL   Hgb urine dipstick NEGATIVE NEGATIVE   Bilirubin Urine NEGATIVE NEGATIVE   Ketones, ur 5 (A) NEGATIVE mg/dL   Protein, ur 30 (A) NEGATIVE mg/dL   Nitrite NEGATIVE NEGATIVE   Leukocytes,Ua NEGATIVE NEGATIVE   RBC / HPF 0-5 0 - 5 RBC/hpf   WBC, UA 0-5 0 - 5 WBC/hpf   Bacteria, UA RARE (A) NONE SEEN   Squamous Epithelial / LPF 6-10 0 - 5   Mucus PRESENT     Comment: Performed at [facility], [address] [place], [place] [address]  SARS Coronavirus 2 by RT PCR (hospital order, performed in [facility] lab) Nasopharyngeal Nasopharyngeal Swab     Status: None   Collection Time: 06/14/20  3:51 PM   Specimen: Nasopharyngeal Swab  Result Value Ref Range   SARS Coronavirus 2 NEGATIVE NEGATIVE    Comment: (NOTE) SARS-CoV-2 target nucleic acids are NOT DETECTED.  The SARS-CoV-2 RNA is generally detectable in upper and lower respiratory specimens during the acute phase of infection. The lowest concentration of SARS-CoV-2 viral copies this assay can detect is 250 copies / mL. A negative result does not preclude SARS-CoV-2 infection and should not be used as the sole basis for treatment or other patient management decisions.  A negative result may occur  with improper specimen collection / handling, submission of specimen other than nasopharyngeal swab, presence of viral mutation(s) within the areas targeted by this assay, and inadequate number of viral copies (<250 copies / mL). A negative result must be combined with clinical observations, patient history, and epidemiological information.  Fact Sheet for Patients:   BoilerBrush.com.cyhttps://www.fda.gov/media/136312/download  Fact Sheet for Healthcare Providers: https://pope.com/https://www.fda.gov/media/136313/download  This test is not yet approved or  cleared by the [place] FDA and has been authorized for detection and/or diagnosis of SARS-CoV-2 by FDA under an Emergency Use Authorization (EUA).  This EUA will remain in effect (meaning this test can be used) for the duration of the COVID-19 declaration under Section 564(b)(1) of the Act, 21 U.S.C. section 360bbb-3(b)(1), unless the authorization is terminated or revoked sooner.  Performed at [facility], [address] [place], [place] [address]   Type and screen [facility]     Status: None   Collection Time: 06/14/20  7:23 PM  Result Value Ref Range   ABO/RH(D) O POS    Antibody Screen NEG    Sample Expiration      06/17/2020,2359 Performed at [facility], [address] [place], [place] [address]   CBC     Status: Abnormal   Collection Time: 06/14/20  8:38 PM  Result Value Ref Range   WBC 8.4 4.0 - 10.5 K/uL   RBC 4.20 3.87 - 5.11 MIL/uL   Hemoglobin 11.8 (L) 12.0 - 15.0 g/dL   HCT 73.4 36 - 46 %   MCV 89.0 80.0 - 100.0 fL   MCH 28.1 26.0 - 34.0 pg   MCHC 31.6 30.0 - 36.0 g/dL   RDW 19.3 79.0 - 24.0 %   Platelets 220 150 - 400 K/uL   nRBC 0.0 0.0 - 0.2 %    Comment: Performed at [facility], [address], [place], [place] [address]  Glucose, capillary     Status: Abnormal   Collection Time: 06/15/20  5:04 AM  Result Value Ref Range   Glucose-Capillary 105 (H) 70 - 99 mg/dL    Comment: Glucose  reference range applies only to samples taken after fasting for at least 8 hours.  Glucose, fasting - daily while on taper     Status: Abnormal   Collection Time: 06/15/20  5:38 AM  Result Value Ref Range   Glucose, Bld 100 (H) 70 - 99 mg/dL    Comment: Glucose reference range applies only to samples taken after fasting for at least 8 hours. Performed at [facility], [address] [place], [place] [address]   Comprehensive metabolic panel     Status: Abnormal   Collection Time: 06/15/20  9:28 AM  Result Value Ref Range   Sodium 136 135 - 145 mmol/L   Potassium 4.0 3.5 - 5.1 mmol/L   Chloride 106 98 - 111 mmol/L   CO2 23 22 - 32 mmol/L   Glucose, Bld 93 70 - 99 mg/dL    Comment: Glucose reference range applies only to samples taken after fasting for at least 8 hours.   BUN 7 6 - 20 mg/dL   Creatinine, Ser 2.68 0.44 - 1.00 mg/dL   Calcium 9.0 8.9 - 34.1 mg/dL   Total Protein 6.0 (L) 6.5 - 8.1 g/dL   Albumin 3.1 (L) 3.5 - 5.0 g/dL   AST 14 (L) 15 - 41 U/L   ALT 12 0 - 44 U/L   Alkaline Phosphatase 50 38 - 126 U/L   Total Bilirubin 0.9 0.3 - 1.2 mg/dL   GFR calc non Af Amer >60 >60 mL/min   GFR calc Af Amer >60 >60 mL/min   Anion gap 7 5 - 15    Comment: Performed at [facility], [address] [place], [place] [address]  Glucose, fasting - daily while on taper     Status: None   Collection Time: 06/16/20  5:55 AM  Result Value Ref Range   Glucose, Bld 95 70 - 99 mg/dL    Comment: Glucose reference range applies only to samples taken after fasting for at least 8 hours. Performed at [facility], [address], [place], [place] [address]   Glucose, capillary     Status: None   Collection Time: 06/16/20  6:47 AM  Result Value Ref Range   Glucose-Capillary 92 70 - 99 mg/dL    Comment: Glucose reference range applies only to samples taken after fasting for at least 8 hours.    Discharge Exam: Blood pressure 113/69, pulse 83, temperature 98.6 F (37 C),  temperature source Oral, resp. rate 18, height 4\' 11"  (1.499 m), weight 57.2 kg, last menstrual period 04/19/2020, SpO2 96 %, unknown if currently breastfeeding. CONSTITUTIONAL: Well-developed, well-nourished female in no acute distress.  SKIN: Skin is warm and dry. No rash noted. Not diaphoretic. No erythema. No pallor. NEUROLGIC: Alert and oriented to person, place, and time. Normal reflexes, muscle tone coordination. No cranial nerve deficit noted. PSYCHIATRIC: Normal mood and affect. Normal behavior. Normal judgment and thought content. CARDIOVASCULAR: Normal heart rate noted RESPIRATORY: Effort and breath sounds normal, no problems with respiration noted MUSCULOSKELETAL: Normal range of motion. No edema and no tenderness. ABDOMEN: Soft, nontender, nondistended,  CERVIX: Deferred  Disposition: Discharge disposition: 01-Home or Self Care        Allergies as of 06/16/2020      Reactions   Morphine And Related Anaphylaxis, Swelling, Other (See Comments)   "swells shut" the patient's throat      Medication List    TAKE these medications   albuterol 108 (90 Base) MCG/ACT inhaler Commonly known as: VENTOLIN HFA Inhale 1-2 puffs into the lungs every 6 (six) hours as needed for wheezing or shortness of breath.   Doxylamine-Pyridoxine 10-10 MG Tbec Take one tablet in the morning, one in midafternoon, and two at bedtime. Take on empty stomach with a glass of water.   enoxaparin 40 MG/0.4ML injection Commonly known as: LOVENOX Inject 0.4 mLs (40 mg total) into the skin daily.   glycopyrrolate 1 MG tablet Commonly known as: ROBINUL Take 2 tablets (2 mg total) by mouth 3 (three) times daily as needed (spitting).   methylPREDNISolone 4 MG tablet Commonly known as: MEDROL Continue taper as recommended by Pharmacy   metoCLOPramide 10 MG tablet Commonly known as: REGLAN Take 1 tablet (10 mg total) by mouth 4 (four) times daily as needed for nausea or vomiting.   pantoprazole 20 MG  tablet Commonly known as: Protonix Take 1 tablet (20 mg total) by mouth daily.   PRENATAL PO Take 1 tablet by mouth daily.   prochlorperazine 10 MG tablet Commonly known as: COMPAZINE Take 1 tablet (10 mg total) by mouth every 6 (six) hours as needed for nausea or vomiting.   promethazine 25 MG suppository Commonly known as: Phenergan Place 1 suppository (25 mg total) rectally every 6 (six) hours as needed for nausea or vomiting.      Total discharge time: 20 minutes  Signed: Jaynie Collins, MD 06/16/2020, 10:58 AM

## 2020-06-16 NOTE — Plan of Care (Signed)
  Problem: Education: Goal: Knowledge of General Education information will improve Description: Including pain rating scale, medication(s)/side effects and non-pharmacologic comfort measures Outcome: Adequate for Discharge   Problem: Health Behavior/Discharge Planning: Goal: Ability to manage health-related needs will improve Outcome: Adequate for Discharge   Problem: Clinical Measurements: Goal: Ability to maintain clinical measurements within normal limits will improve Outcome: Adequate for Discharge Goal: Will remain free from infection Outcome: Adequate for Discharge Goal: Diagnostic test results will improve Outcome: Adequate for Discharge Goal: Respiratory complications will improve Outcome: Adequate for Discharge Goal: Cardiovascular complication will be avoided Outcome: Adequate for Discharge   Problem: Activity: Goal: Risk for activity intolerance will decrease Outcome: Adequate for Discharge   Problem: Nutrition: Goal: Adequate nutrition will be maintained Outcome: Adequate for Discharge   Problem: Coping: Goal: Level of anxiety will decrease Outcome: Adequate for Discharge   Problem: Elimination: Goal: Will not experience complications related to bowel motility Outcome: Adequate for Discharge Goal: Will not experience complications related to urinary retention Outcome: Adequate for Discharge   Problem: Pain Managment: Goal: General experience of comfort will improve Outcome: Adequate for Discharge   Problem: Safety: Goal: Ability to remain free from injury will improve Outcome: Adequate for Discharge   Problem: Skin Integrity: Goal: Risk for impaired skin integrity will decrease Outcome: Adequate for Discharge   Problem: Education: Goal: Knowledge of disease or condition will improve Outcome: Adequate for Discharge Goal: Knowledge of the prescribed therapeutic regimen will improve Outcome: Adequate for Discharge   Problem: Bowel/Gastric: Goal:  Occurences of nausea and/or vomiting will decrease Outcome: Adequate for Discharge   Problem: Fluid Volume: Goal: Maintenance of adequate hydration will improve Outcome: Adequate for Discharge   Problem: Nutritional: Goal: Achievement of adequate weight for body size and type will improve Outcome: Adequate for Discharge   

## 2020-06-16 NOTE — Progress Notes (Signed)
Pharmacy Consult:   MEDROL (METHYLPREDNISOLONE) TAPER  FOR HYPEREMESIS GRAVIDARUM PATIENTS  The following is a 14 day taper of methylprednisolone for hyperemesis. Doses on day 1  will be given IV.  All doses starting on day 2  will be given PO. (If patient cannot tolerate oral medications, contact the pharmacy to change route to IV.)   Date Day Morning Midday Bedtime  9/6 1   48 mg  9/7 2 16  mg 16 mg 16 mg  9/8 3 16  mg 16 mg 16 mg  9/9 4 16  mg 8 mg 16 mg  9/10 5 16  mg 8 mg 8 mg  9/11 6 8  mg 8 mg 8 mg  9/12 7 8  mg 4 mg 8 mg  9/13 8 8  mg 4 mg 4 mg  9/14 9 8  mg 4 mg   9/15 10 8  mg 4 mg   9/16 11 8  mg    9/17 12 8  mg    9/18 13 4  mg    9/19 14 4  mg     Check fasting blood sugars daily while on the taper. Notify MD if fasting blood sugar>95.  Natasha Bence 06/16/2020

## 2020-06-16 NOTE — Discharge Instructions (Signed)
Hyperemesis Gravidarum Hyperemesis gravidarum is a severe form of nausea and vomiting that happens during pregnancy. Hyperemesis is worse than morning sickness. It may cause you to have nausea or vomiting all day for many days. It may keep you from eating and drinking enough food and liquids, which can lead to dehydration, malnutrition, and weight loss. Hyperemesis usually occurs during the first half (the first 20 weeks) of pregnancy. It often goes away once a woman is in her second half of pregnancy. However, sometimes hyperemesis continues through an entire pregnancy. What are the causes? The cause of this condition is not known. It may be related to changes in chemicals (hormones) in the body during pregnancy, such as the high level of pregnancy hormone (human chorionic gonadotropin) or the increase in the female sex hormone (estrogen). What are the signs or symptoms? Symptoms of this condition include:  Nausea that does not go away.  Vomiting that does not allow you to keep any food down.  Weight loss.  Body fluid loss (dehydration).  Having no desire to eat, or not liking food that you have previously enjoyed. How is this diagnosed? This condition may be diagnosed based on:  A physical exam.  Your medical history.  Your symptoms.  Blood tests.  Urine tests. How is this treated? This condition is managed by controlling symptoms. This may include:  Following an eating plan. This can help lessen nausea and vomiting.  Taking prescription medicines. An eating plan and medicines are often used together to help control symptoms. If medicines do not help relieve nausea and vomiting, you may need to receive fluids through an IV at the hospital. Follow these instructions at home: Eating and drinking   Avoid the following: ? Drinking fluids with meals. Try not to drink anything during the 30 minutes before and after your meals. ? Drinking more than 1 cup of fluid at a  time. ? Eating foods that trigger your symptoms. These may include spicy foods, coffee, high-fat foods, very sweet foods, and acidic foods. ? Skipping meals. Nausea can be more intense on an empty stomach. If you cannot tolerate food, do not force it. Try sucking on ice chips or other frozen items and make up for missed calories later. ? Lying down within 2 hours after eating. ? Being exposed to environmental triggers. These may include food smells, smoky rooms, closed spaces, rooms with strong smells, warm or humid places, overly loud and noisy rooms, and rooms with motion or flickering lights. Try eating meals in a well-ventilated area that is free of strong smells. ? Quick and sudden changes in your movement. ? Taking iron pills and multivitamins that contain iron. If you take prescription iron pills, do not stop taking them unless your health care provider approves. ? Preparing food. The smell of food can spoil your appetite or trigger nausea.  To help relieve your symptoms: ? Listen to your body. Everyone is different and has different preferences. Find what works best for you. ? Eat and drink slowly. ? Eat 5-6 small meals daily instead of 3 large meals. Eating small meals and snacks can help you avoid an empty stomach. ? In the morning, before getting out of bed, eat a couple of crackers to avoid moving around on an empty stomach. ? Try eating starchy foods as these are usually tolerated well. Examples include cereal, toast, bread, potatoes, pasta, rice, and pretzels. ? Include at least 1 serving of protein with your meals and snacks. Protein options include   lean meats, poultry, seafood, beans, nuts, nut butters, eggs, cheese, and yogurt. ? Try eating a protein-rich snack before bed. Examples of a protein-rick snack include cheese and crackers or a peanut butter sandwich made with 1 slice of whole-wheat bread and 1 tsp (5 g) of peanut butter. ? Eat or suck on things that have ginger in them.  It may help relieve nausea. Add  tsp ground ginger to hot tea or choose ginger tea. ? Try drinking 100% fruit juice or an electrolyte drink. An electrolyte drink contains sodium, potassium, and chloride. ? Drink fluids that are cold, clear, and carbonated or sour. Examples include lemonade, ginger ale, lemon-lime soda, ice water, and sparkling water. ? Brush your teeth or use a mouth rinse after meals. ? Talk with your health care provider about starting a supplement of vitamin B6. General instructions  Take over-the-counter and prescription medicines only as told by your health care provider.  Follow instructions from your health care provider about eating or drinking restrictions.  Continue to take your prenatal vitamins as told by your health care provider. If you are having trouble taking your prenatal vitamins, talk with your health care provider about different options.  Keep all follow-up and pre-birth (prenatal) visits as told by your health care provider. This is important. Contact a health care provider if:  You have pain in your abdomen.  You have a severe headache.  You have vision problems.  You are losing weight.  You feel weak or dizzy. Get help right away if:  You cannot drink fluids without vomiting.  You vomit blood.  You have constant nausea and vomiting.  You are very weak.  You faint.  You have a fever and your symptoms suddenly get worse. Summary  Hyperemesis gravidarum is a severe form of nausea and vomiting that happens during pregnancy.  Making some changes to your eating habits may help relieve nausea and vomiting.  This condition may be managed with medicine.  If medicines do not help relieve nausea and vomiting, you may need to receive fluids through an IV at the hospital. This information is not intended to replace advice given to you by your health care provider. Make sure you discuss any questions you have with your health care  provider. Document Revised: 10/15/2017 Document Reviewed: 05/24/2016 Elsevier Patient Education  2020 [employer] Inc.  

## 2020-06-16 NOTE — Progress Notes (Signed)
Pt alert and oriented upon discharge. Pt good, stable, ambulatory. Pt left at 1140. All discharge education given and all questions answered. Pt verbalized understanding and teach back method used.

## 2020-06-28 ENCOUNTER — Ambulatory Visit (INDEPENDENT_AMBULATORY_CARE_PROVIDER_SITE_OTHER): Payer: Medicaid Other | Admitting: Obstetrics and Gynecology

## 2020-06-28 ENCOUNTER — Encounter (HOSPITAL_COMMUNITY): Payer: Self-pay | Admitting: Obstetrics and Gynecology

## 2020-06-28 ENCOUNTER — Inpatient Hospital Stay (HOSPITAL_COMMUNITY)
Admission: AD | Admit: 2020-06-28 | Discharge: 2020-06-28 | Disposition: A | Discharge status: Home / Self Care | Payer: Medicaid Other | Attending: Obstetrics and Gynecology | Admitting: Obstetrics and Gynecology

## 2020-06-28 ENCOUNTER — Other Ambulatory Visit: Payer: Self-pay

## 2020-06-28 VITALS — BP 105/78 | HR 99

## 2020-06-28 DIAGNOSIS — Z3A08 8 weeks gestation of pregnancy: Secondary | ICD-10-CM | POA: Insufficient documentation

## 2020-06-28 DIAGNOSIS — F12188 Cannabis abuse with other cannabis-induced disorder: Secondary | ICD-10-CM | POA: Diagnosis not present

## 2020-06-28 DIAGNOSIS — O99321 Drug use complicating pregnancy, first trimester: Secondary | ICD-10-CM

## 2020-06-28 DIAGNOSIS — Z79899 Other long term (current) drug therapy: Secondary | ICD-10-CM | POA: Diagnosis not present

## 2020-06-28 DIAGNOSIS — O26891 Other specified pregnancy related conditions, first trimester: Secondary | ICD-10-CM | POA: Insufficient documentation

## 2020-06-28 DIAGNOSIS — Z86711 Personal history of pulmonary embolism: Secondary | ICD-10-CM

## 2020-06-28 DIAGNOSIS — Z87891 Personal history of nicotine dependence: Secondary | ICD-10-CM | POA: Diagnosis not present

## 2020-06-28 DIAGNOSIS — O210 Mild hyperemesis gravidarum: Secondary | ICD-10-CM | POA: Diagnosis not present

## 2020-06-28 DIAGNOSIS — E876 Hypokalemia: Secondary | ICD-10-CM | POA: Diagnosis not present

## 2020-06-28 DIAGNOSIS — Z3480 Encounter for supervision of other normal pregnancy, unspecified trimester: Secondary | ICD-10-CM

## 2020-06-28 DIAGNOSIS — Z7952 Long term (current) use of systemic steroids: Secondary | ICD-10-CM | POA: Diagnosis not present

## 2020-06-28 DIAGNOSIS — O99511 Diseases of the respiratory system complicating pregnancy, first trimester: Secondary | ICD-10-CM | POA: Diagnosis not present

## 2020-06-28 DIAGNOSIS — J45909 Unspecified asthma, uncomplicated: Secondary | ICD-10-CM | POA: Insufficient documentation

## 2020-06-28 DIAGNOSIS — Z8759 Personal history of other complications of pregnancy, childbirth and the puerperium: Secondary | ICD-10-CM

## 2020-06-28 DIAGNOSIS — O0990 Supervision of high risk pregnancy, unspecified, unspecified trimester: Secondary | ICD-10-CM | POA: Insufficient documentation

## 2020-06-28 DIAGNOSIS — K117 Disturbances of salivary secretion: Secondary | ICD-10-CM

## 2020-06-28 LAB — URINALYSIS, ROUTINE W REFLEX MICROSCOPIC
Bacteria, UA: NONE SEEN
Bilirubin Urine: NEGATIVE
Glucose, UA: NEGATIVE mg/dL
Hgb urine dipstick: NEGATIVE
Ketones, ur: 80 mg/dL — AB
Leukocytes,Ua: NEGATIVE
Nitrite: NEGATIVE
Protein, ur: 30 mg/dL — AB
Specific Gravity, Urine: 1.029 (ref 1.005–1.030)
pH: 6 (ref 5.0–8.0)

## 2020-06-28 LAB — CBC
HCT: 40.7 % (ref 36.0–46.0)
Hemoglobin: 13.5 g/dL (ref 12.0–15.0)
MCH: 29 pg (ref 26.0–34.0)
MCHC: 33.2 g/dL (ref 30.0–36.0)
MCV: 87.3 fL (ref 80.0–100.0)
Platelets: 263 10*3/uL (ref 150–400)
RBC: 4.66 MIL/uL (ref 3.87–5.11)
RDW: 12.7 % (ref 11.5–15.5)
WBC: 7.6 10*3/uL (ref 4.0–10.5)
nRBC: 0 % (ref 0.0–0.2)

## 2020-06-28 LAB — COMPREHENSIVE METABOLIC PANEL
ALT: 9 U/L (ref 0–44)
AST: 15 U/L (ref 15–41)
Albumin: 3.8 g/dL (ref 3.5–5.0)
Alkaline Phosphatase: 59 U/L (ref 38–126)
Anion gap: 12 (ref 5–15)
BUN: 8 mg/dL (ref 6–20)
CO2: 22 mmol/L (ref 22–32)
Calcium: 9.2 mg/dL (ref 8.9–10.3)
Chloride: 101 mmol/L (ref 98–111)
Creatinine, Ser: 0.66 mg/dL (ref 0.44–1.00)
GFR calc Af Amer: >60 mL/min (ref >60)
GFR calc non Af Amer: >60 mL/min (ref >60)
Glucose, Bld: 87 mg/dL (ref 70–99)
Potassium: 3.3 mmol/L — ABNORMAL LOW (ref 3.5–5.1)
Sodium: 135 mmol/L (ref 135–145)
Total Bilirubin: 1 mg/dL (ref 0.3–1.2)
Total Protein: 7.3 g/dL (ref 6.5–8.1)

## 2020-06-28 LAB — RAPID URINE DRUG SCREEN, HOSP PERFORMED
Amphetamines: NOT DETECTED
Barbiturates: NOT DETECTED
Benzodiazepines: NOT DETECTED
Cocaine: NOT DETECTED
Opiates: NOT DETECTED
Tetrahydrocannabinol: POSITIVE — AB

## 2020-06-28 MED ORDER — ONDANSETRON HCL 4 MG/2ML IJ SOLN
4.0000 mg | Freq: Once | INTRAMUSCULAR | Status: AC
  Administered 2020-06-28: 4 mg via INTRAVENOUS
  Filled 2020-06-28: qty 2

## 2020-06-28 MED ORDER — LACTATED RINGERS IV BOLUS
1000.0000 mL | Freq: Once | INTRAVENOUS | Status: AC
  Administered 2020-06-28: 1000 mL via INTRAVENOUS

## 2020-06-28 MED ORDER — SCOPOLAMINE 1 MG/3DAYS TD PT72
1.0000 | MEDICATED_PATCH | TRANSDERMAL | 12 refills | Status: DC
Start: 2020-07-01 — End: 2020-07-15

## 2020-06-28 MED ORDER — M.V.I. ADULT IV INJ
Freq: Once | INTRAVENOUS | Status: AC
  Filled 2020-06-28: qty 10

## 2020-06-28 MED ORDER — POTASSIUM CHLORIDE 10 MEQ/100ML IV SOLN
10.0000 meq | Freq: Once | INTRAVENOUS | Status: AC
  Administered 2020-06-28: 10 meq via INTRAVENOUS
  Filled 2020-06-28: qty 100

## 2020-06-28 MED ORDER — ONDANSETRON 4 MG PO TBDP: 4.0000 mg | ORAL_TABLET | Freq: Four times a day (QID) | ORAL | 0 refills | Status: DC | PRN

## 2020-06-28 MED ORDER — HALOPERIDOL LACTATE 5 MG/ML IJ SOLN
5.0000 mg | Freq: Four times a day (QID) | INTRAMUSCULAR | Status: DC | PRN
  Administered 2020-06-28: 5 mg via INTRAVENOUS
  Filled 2020-06-28 (×2): qty 1

## 2020-06-28 MED ORDER — FAMOTIDINE IN NACL 20-0.9 MG/50ML-% IV SOLN
20.0000 mg | Freq: Once | INTRAVENOUS | Status: AC
  Administered 2020-06-28: 20 mg via INTRAVENOUS
  Filled 2020-06-28: qty 50

## 2020-06-28 MED ORDER — DIPHENHYDRAMINE HCL 50 MG/ML IJ SOLN
25.0000 mg | Freq: Once | INTRAMUSCULAR | Status: AC
  Administered 2020-06-28: 25 mg via INTRAVENOUS
  Filled 2020-06-28: qty 1

## 2020-06-28 MED ORDER — GLYCOPYRROLATE 0.2 MG/ML IJ SOLN
0.2000 mg | Freq: Once | INTRAMUSCULAR | Status: AC
  Administered 2020-06-28: 0.2 mg via INTRAVENOUS
  Filled 2020-06-28: qty 1

## 2020-06-28 MED ORDER — SCOPOLAMINE 1 MG/3DAYS TD PT72
1.0000 | MEDICATED_PATCH | TRANSDERMAL | Status: DC
  Administered 2020-06-28: 1.5 mg via TRANSDERMAL
  Filled 2020-06-28: qty 1

## 2020-06-28 MED ORDER — DIPHENHYDRAMINE HCL 25 MG PO CAPS: 50.0000 mg | ORAL_CAPSULE | Freq: Once | ORAL | Status: DC

## 2020-06-28 NOTE — Progress Notes (Signed)
   Subjective:    Patient ID: Carmen [name] is a 29 y.o. female presenting with Initial Prenatal Visit  on 06/28/2020  HPI: Visit unable to be completed due to severe nausea.  Pt is unable to sit on the table.  She was discharged on 06/16/2020 for the same issue.  She received a steroid taper, rectal phenergan, reglan, compazine and diclegis.  Per pt and spouse she is not tolerating food or liquids.  Pt and spouse deny current THC use.    Review of Systems    Objective:    BP 105/78   Pulse 99   LMP 04/19/2020 (Approximate)  Physical Exam Cardiovascular:     Rate and Rhythm: Normal rate and regular rhythm.     Heart sounds: Normal heart sounds.  Pulmonary:     Effort: Pulmonary effort is normal.     Breath sounds: Normal breath sounds.         Assessment & Plan:  Hyperemesis gravidarum:   Pt to MAU for IV fluids and IV antiemetics.  Pt has not tried zofran.  At this point benefits are greater than risks, advise 8mg  first dose followed by 4-8 mg doses q 6-8 hours depending on response. Will complete new ob visit once pt is more stable :No follow-ups on file.  06/28/2020 2:46 PM

## 2020-06-28 NOTE — Patient Instructions (Signed)
Hyperemesis Gravidarum Hyperemesis gravidarum is a severe form of nausea and vomiting that happens during pregnancy. Hyperemesis is worse than morning sickness. It may cause you to have nausea or vomiting all day for many days. It may keep you from eating and drinking enough food and liquids, which can lead to dehydration, malnutrition, and weight loss. Hyperemesis usually occurs during the first half (the first 20 weeks) of pregnancy. It often goes away once a woman is in her second half of pregnancy. However, sometimes hyperemesis continues through an entire pregnancy. What are the causes? The cause of this condition is not known. It may be related to changes in chemicals (hormones) in the body during pregnancy, such as the high level of pregnancy hormone (human chorionic gonadotropin) or the increase in the female sex hormone (estrogen). What are the signs or symptoms? Symptoms of this condition include:  Nausea that does not go away.  Vomiting that does not allow you to keep any food down.  Weight loss.  Body fluid loss (dehydration).  Having no desire to eat, or not liking food that you have previously enjoyed. How is this diagnosed? This condition may be diagnosed based on:  A physical exam.  Your medical history.  Your symptoms.  Blood tests.  Urine tests. How is this treated? This condition is managed by controlling symptoms. This may include:  Following an eating plan. This can help lessen nausea and vomiting.  Taking prescription medicines. An eating plan and medicines are often used together to help control symptoms. If medicines do not help relieve nausea and vomiting, you may need to receive fluids through an IV at the hospital. Follow these instructions at home: Eating and drinking   Avoid the following: ? Drinking fluids with meals. Try not to drink anything during the 30 minutes before and after your meals. ? Drinking more than 1 cup of fluid at a  time. ? Eating foods that trigger your symptoms. These may include spicy foods, coffee, high-fat foods, very sweet foods, and acidic foods. ? Skipping meals. Nausea can be more intense on an empty stomach. If you cannot tolerate food, do not force it. Try sucking on ice chips or other frozen items and make up for missed calories later. ? Lying down within 2 hours after eating. ? Being exposed to environmental triggers. These may include food smells, smoky rooms, closed spaces, rooms with strong smells, warm or humid places, overly loud and noisy rooms, and rooms with motion or flickering lights. Try eating meals in a well-ventilated area that is free of strong smells. ? Quick and sudden changes in your movement. ? Taking iron pills and multivitamins that contain iron. If you take prescription iron pills, do not stop taking them unless your health care provider approves. ? Preparing food. The smell of food can spoil your appetite or trigger nausea.  To help relieve your symptoms: ? Listen to your body. Everyone is different and has different preferences. Find what works best for you. ? Eat and drink slowly. ? Eat 5-6 small meals daily instead of 3 large meals. Eating small meals and snacks can help you avoid an empty stomach. ? In the morning, before getting out of bed, eat a couple of crackers to avoid moving around on an empty stomach. ? Try eating starchy foods as these are usually tolerated well. Examples include cereal, toast, bread, potatoes, pasta, rice, and pretzels. ? Include at least 1 serving of protein with your meals and snacks. Protein options include   lean meats, poultry, seafood, beans, nuts, nut butters, eggs, cheese, and yogurt. ? Try eating a protein-rich snack before bed. Examples of a protein-rick snack include cheese and crackers or a peanut butter sandwich made with 1 slice of whole-wheat bread and 1 tsp (5 g) of peanut butter. ? Eat or suck on things that have ginger in them.  It may help relieve nausea. Add  tsp ground ginger to hot tea or choose ginger tea. ? Try drinking 100% fruit juice or an electrolyte drink. An electrolyte drink contains sodium, potassium, and chloride. ? Drink fluids that are cold, clear, and carbonated or sour. Examples include lemonade, ginger ale, lemon-lime soda, ice water, and sparkling water. ? Brush your teeth or use a mouth rinse after meals. ? Talk with your health care provider about starting a supplement of vitamin B6. General instructions  Take over-the-counter and prescription medicines only as told by your health care provider.  Follow instructions from your health care provider about eating or drinking restrictions.  Continue to take your prenatal vitamins as told by your health care provider. If you are having trouble taking your prenatal vitamins, talk with your health care provider about different options.  Keep all follow-up and pre-birth (prenatal) visits as told by your health care provider. This is important. Contact a health care provider if:  You have pain in your abdomen.  You have a severe headache.  You have vision problems.  You are losing weight.  You feel weak or dizzy. Get help right away if:  You cannot drink fluids without vomiting.  You vomit blood.  You have constant nausea and vomiting.  You are very weak.  You faint.  You have a fever and your symptoms suddenly get worse. Summary  Hyperemesis gravidarum is a severe form of nausea and vomiting that happens during pregnancy.  Making some changes to your eating habits may help relieve nausea and vomiting.  This condition may be managed with medicine.  If medicines do not help relieve nausea and vomiting, you may need to receive fluids through an IV at the hospital. This information is not intended to replace advice given to you by your health care provider. Make sure you discuss any questions you have with your health care  provider. Document Revised: 10/15/2017 Document Reviewed: 05/24/2016 Elsevier Patient Education  2020 [employer]  

## 2020-06-28 NOTE — Progress Notes (Signed)
Unable to access Babys heart rate Mom was not feeling well enough to get on table

## 2020-06-28 NOTE — MAU Provider Note (Signed)
History     CSN: [id number]  Arrival date and time: 06/28/20 1502   First Provider Initiated Contact with Patient 06/28/20 1528      Chief Complaint  Patient presents with  . Morning Sickness   HPI Carmen [name] is a 29 y.o. G2P1001 at [redacted]w[redacted]d who presents to MAU with chief complaint of recurrent nausea and vomiting. She attempts PO intake each day but has not taken any medication in the past two days as she feels it all stopped working. She states she tried Phenergan suppositories x 2 administrations but was uncomfortable with the sleepiness it caused and discontinued use.  Patient endorses marijuana use within the past 1-2 months. She denies current use. Patient is s/p [facility] admission for Hyperemesis Gravidarum from 06/17/2020-06/16/2020.  She denies abdominal pain, vaginal bleeding, dysuria, fever or recent illness.  OB History    Gravida  2   Para  1   Term  1   Preterm  0   AB  0   Living  1     SAB  0   TAB  0   Ectopic  0   Multiple  0   Live Births  1           Past Medical History:  Diagnosis Date  . Anemia   . Asthma   . Panic attacks   . Pulmonary embolism (HCC) 02/08/2018  . Pulmonary embolism, antepartum 02/08/2018   Indications: PE diagnosed 5/2@ [facility].  Left side, lower lobe;   Admitted 5/2-5/6 - heparin drip>lovenox 60BID.  ED evaluation 5/8 2/2 persistent SOB - negative DVT, nml ECG Anticoagulation goal is therapeutic  Therapeutic dose is Lovenox 1mg /kg SQ Q12--currently on 60 mg BID  Monitoring is indicated for patients on therapeutic anticoagulation  Will check Anti-Xa level 4-6 hours after 3rd dose Q trime    Past Surgical History:  Procedure Laterality Date  . NO PAST SURGERIES      Family History  Problem Relation Age of Onset  . Pulmonary embolism Mother   . Ulcers Sister     Social History   Tobacco Use  . Smoking status: Former Smoker    Types: Cigarettes  . Smokeless tobacco: Never Used  Vaping Use  . Vaping Use: Never  used  Substance Use Topics  . Alcohol use: Not Currently  . Drug use: No    Allergies:  Allergies  Allergen Reactions  . Morphine And Related Anaphylaxis, Swelling and Other (See Comments)    "swells shut" the patient's throat    Medications Prior to Admission  Medication Sig Dispense Refill Last Dose  . albuterol (VENTOLIN HFA) 108 (90 Base) MCG/ACT inhaler Inhale 1-2 puffs into the lungs every 6 (six) hours as needed for wheezing or shortness of breath. 18 g 2 Past Month at Unknown time  . enoxaparin (LOVENOX) 40 MG/0.4ML injection Inject 0.4 mLs (40 mg total) into the skin daily. 30 mL 2 06/27/2020 at Unknown time  . methylPREDNISolone (MEDROL) 4 MG tablet Continue taper as recommended by Pharmacy 60 tablet 0 Past Week at Unknown time  . metoCLOPramide (REGLAN) 10 MG tablet Take 1 tablet (10 mg total) by mouth 4 (four) times daily as needed for nausea or vomiting. 30 tablet 2 Past Week at Unknown time  . pantoprazole (PROTONIX) 20 MG tablet Take 1 tablet (20 mg total) by mouth daily. 30 tablet 1 Past Week at Unknown time  . Prenatal Vit-Fe Fumarate-FA (PRENATAL PO) Take 1 tablet by mouth daily.  Past Week at Unknown time  . prochlorperazine (COMPAZINE) 10 MG tablet Take 1 tablet (10 mg total) by mouth every 6 (six) hours as needed for nausea or vomiting. 30 tablet 0 Past Week at Unknown time  . promethazine (PHENERGAN) 25 MG suppository Place 1 suppository (25 mg total) rectally every 6 (six) hours as needed for nausea or vomiting. 12 each 0 Past Week at Unknown time  . Doxylamine-Pyridoxine 10-10 MG TBEC Take one tablet in the morning, one in midafternoon, and two at bedtime. Take on empty stomach with a glass of water. (Patient not taking: Reported on 06/15/2020) 60 tablet 1   . glycopyrrolate (ROBINUL) 1 MG tablet Take 2 tablets (2 mg total) by mouth 3 (three) times daily as needed (spitting). (Patient not taking: Reported on 06/28/2020) 30 tablet 2     Review of Systems   Gastrointestinal: Positive for nausea and vomiting.  Neurological: Positive for weakness.  All other systems reviewed and are negative.  Physical Exam   Blood pressure 106/70, pulse 87, temperature 98.7 F (37.1 C), resp. rate 16, weight 53.1 kg, last menstrual period 04/19/2020, SpO2 100 %, unknown if currently breastfeeding.  Physical Exam Vitals and nursing note reviewed. Exam conducted with a chaperone present.  Constitutional:      Appearance: She is ill-appearing.  HENT:     Mouth/Throat:     Mouth: Mucous membranes are moist.  Cardiovascular:     Rate and Rhythm: Normal rate.     Pulses: Normal pulses.  Pulmonary:     Effort: Pulmonary effort is normal.  Abdominal:     General: Bowel sounds are normal.     Tenderness: There is no right CVA tenderness or left CVA tenderness.  Skin:    General: Skin is warm and dry.     Capillary Refill: Capillary refill takes less than 2 seconds.  Neurological:     General: No focal deficit present.     MAU Course  Procedures   --Patient with scant emesis prior to Haldol and Benadryl --Tolerating PO prior to discharge --9 ln weight loss since early September. Concern for irregular use of prescribed medications. --Mild hypokalemia. S/p Potassium run x 1 in MAU  --Discussed with Dr. Despina Hidden, patient to attempt more consistent use of therapy. Admission not indicated at this time.  Orders Placed This Encounter  Procedures  . Urinalysis, Routine w reflex microscopic  . CBC  . Comprehensive metabolic panel  . Rapid urine drug screen (hospital performed)  . Insert peripheral IV   Patient Vitals for the past 24 hrs:  BP Temp Pulse Resp SpO2 Weight  06/28/20 2127 106/64 -- -- -- -- --  06/28/20 1518 106/70 98.7 F (37.1 C) 87 16 100 % 53.1 kg   Results for orders placed or performed during the hospital encounter of 06/28/20 (from the past 24 hour(s))  Urinalysis, Routine w reflex microscopic Urine, Random     Status: Abnormal    Collection Time: 06/28/20  3:12 PM  Result Value Ref Range   Color, Urine AMBER (A) YELLOW   APPearance HAZY (A) CLEAR   Specific Gravity, Urine 1.029 1.005 - 1.030   pH 6.0 5.0 - 8.0   Glucose, UA NEGATIVE NEGATIVE mg/dL   Hgb urine dipstick NEGATIVE NEGATIVE   Bilirubin Urine NEGATIVE NEGATIVE   Ketones, ur 80 (A) NEGATIVE mg/dL   Protein, ur 30 (A) NEGATIVE mg/dL   Nitrite NEGATIVE NEGATIVE   Leukocytes,Ua NEGATIVE NEGATIVE   RBC / HPF 0-5 0 -  5 RBC/hpf   WBC, UA 0-5 0 - 5 WBC/hpf   Bacteria, UA NONE SEEN NONE SEEN   Squamous Epithelial / LPF 0-5 0 - 5   Mucus PRESENT    Hyaline Casts, UA PRESENT   Rapid urine drug screen (hospital performed)     Status: Abnormal   Collection Time: 06/28/20  3:12 PM  Result Value Ref Range   Opiates NONE DETECTED NONE DETECTED   Cocaine NONE DETECTED NONE DETECTED   Benzodiazepines NONE DETECTED NONE DETECTED   Amphetamines NONE DETECTED NONE DETECTED   Tetrahydrocannabinol POSITIVE (A) NONE DETECTED   Barbiturates NONE DETECTED NONE DETECTED  CBC     Status: None   Collection Time: 06/28/20  4:01 PM  Result Value Ref Range   WBC 7.6 4.0 - 10.5 K/uL   RBC 4.66 3.87 - 5.11 MIL/uL   Hemoglobin 13.5 12.0 - 15.0 g/dL   HCT 25.940.7 36 - 46 %   MCV 87.3 80.0 - 100.0 fL   MCH 29.0 26.0 - 34.0 pg   MCHC 33.2 30.0 - 36.0 g/dL   RDW 56.312.7 87.511.5 - 64.315.5 %   Platelets 263 150 - 400 K/uL   nRBC 0.0 0.0 - 0.2 %  Comprehensive metabolic panel     Status: Abnormal   Collection Time: 06/28/20  4:01 PM  Result Value Ref Range   Sodium 135 135 - 145 mmol/L   Potassium 3.3 (L) 3.5 - 5.1 mmol/L   Chloride 101 98 - 111 mmol/L   CO2 22 22 - 32 mmol/L   Glucose, Bld 87 70 - 99 mg/dL   BUN 8 6 - 20 mg/dL   Creatinine, Ser 3.290.66 0.44 - 1.00 mg/dL   Calcium 9.2 8.9 - 51.810.3 mg/dL   Total Protein 7.3 6.5 - 8.1 g/dL   Albumin 3.8 3.5 - 5.0 g/dL   AST 15 15 - 41 U/L   ALT 9 0 - 44 U/L   Alkaline Phosphatase 59 38 - 126 U/L   Total Bilirubin 1.0 0.3 - 1.2 mg/dL    GFR calc non Af Amer >60 >60 mL/min   GFR calc Af Amer >60 >60 mL/min   Anion gap 12 5 - 15   Meds ordered this encounter  Medications  . lactated ringers bolus 1,000 mL  . ondansetron (ZOFRAN) injection 4 mg  . famotidine (PEPCID) IVPB 20 mg premix  . glycopyrrolate (ROBINUL) injection 0.2 mg  . multivitamins adult (INFUVITE ADULT) 10 mL in lactated ringers 1,000 mL infusion  . potassium chloride 10 mEq in 100 mL IVPB  . haloperidol lactate (HALDOL) injection 5 mg  . DISCONTD: diphenhydrAMINE (BENADRYL) capsule 50 mg  . scopolamine (TRANSDERM-SCOP) 1 MG/3DAYS 1.5 mg  . diphenhydrAMINE (BENADRYL) injection 25 mg  . scopolamine (TRANSDERM-SCOP) 1 MG/3DAYS    Sig: Place 1 patch (1.5 mg total) onto the skin every 3 (three) days.    Dispense:  10 patch    Refill:  12    Order Specific Question:   Supervising Provider    Answer:   Despina HiddenEURE, LUTHER H [[id number]]  . ondansetron (ZOFRAN ODT) 4 MG disintegrating tablet    Sig: Take 1 tablet (4 mg total) by mouth every 6 (six) hours as needed for nausea.    Dispense:  20 tablet    Refill:  0    Order Specific Question:   Supervising Provider    Answer:   Duane LopeEURE, LUTHER H [[id number]]   Assessment and Plan  --28 y.o. G2P1001 at [redacted]w[redacted]d  --  Inconsistent use of previously prescribed medications --+ THC, concern for Cannabis Hyperemesis --Patient tolerating PO prior to discharge --Discharge home in stable condition  Calvert Cantor, CNM 06/28/2020, 10:13 PM

## 2020-06-28 NOTE — Discharge Instructions (Signed)
Cannabinoid Hyperemesis Syndrome Cannabinoid hyperemesis syndrome (CHS) is a condition that causes repeated nausea, vomiting, and abdominal pain after long-term (chronic) use of marijuana (cannabis). People with CHS typically use marijuana 3-5 times a day for many years before they have symptoms, although it is possible to develop CHS with as little as 1 use per day. Symptoms of CHS may be mild at first but can get worse and more frequent. In some cases, CHS may cause vomiting many times a day, which can lead to weight loss and dehydration. CHS may go away and come back many times (recur). People may not have symptoms or may otherwise be healthy in between CHS attacks. What are the causes? The exact cause of this condition is not known. Long-term use of marijuana may over-stimulate certain proteins in the brain that react with chemicals in marijuana (cannabinoid receptors). This over-stimulation may cause CHS. What are the signs or symptoms? Symptoms of this condition are often mild during the first few attacks, but they can get worse over time. Symptoms may include:  Frequent nausea, especially early in the morning.  Vomiting.  Abdominal pain. Taking several hot showers throughout the day can also be a sign of this condition. People with CHS may do this because it relieves symptoms. How is this diagnosed? This condition may be diagnosed based on:  Your symptoms and medical history, including any drug use.  A physical exam. You may have tests done to rule out other problems. These tests may include:  Blood tests.  Urine tests.  Imaging tests, such as an X-ray or CT scan. How is this treated? Treatment for this condition involves stopping marijuana use. Your health care provider may recommend:  A drug rehabilitation program, if you have trouble stopping marijuana use.  Medicines for nausea.  Hot showers to help relieve symptoms. Certain creams that contain a substance called  capsaicin may improve symptoms when applied to the abdomen. Ask your health care provider before starting any medicines or other treatments. Severe nausea and vomiting may require you to stay at the hospital. You may need IV fluids to prevent or treat dehydration. You may also need certain medicines that must be given at the hospital. Follow these instructions at home: During an attack   Stay in bed and rest in a dark, quiet room.  Take anti-nausea medicine as told by your health care provider.  Try taking hot showers to relieve your symptoms. After an attack  Drink small amounts of clear fluids slowly. Gradually add more.  Once you are able to eat without vomiting, eat soft foods in small amounts every 3-4 hours. General instructions   Do not use any products that contain marijuana.If you need help quitting, ask your health care provider for resources and treatment options.  Drink enough fluid to keep your urine pale yellow. Avoid drinking fluids that have a lot of sugar or caffeine, such as coffee and soda.  Take and apply over-the-counter and prescription medicines only as told by your health care provider. Ask your health care provider before starting any new medicines or treatments.  Keep all follow-up visits as told by your health care provider. This is important. Contact a health care provider if:  Your symptoms get worse.  You cannot drink fluids without vomiting.  You have pain and trouble swallowing after an attack. Get help right away if:  You cannot stop vomiting.  You have blood in your vomit or your vomit looks like coffee grounds.  You have   severe abdominal pain.  You have stools that are bloody or black, or stools that look like tar.  You have symptoms of dehydration, such as: ? Sunken eyes. ? Inability to make tears. ? Cracked lips. ? Dry mouth. ? Decreased urine production. ? Weakness. ? Sleepiness. ? Fainting. Summary  Cannabinoid hyperemesis  syndrome (CHS) is a condition that causes repeated nausea, vomiting, and abdominal pain after long-term use of marijuana.  People with CHS typically use marijuana 3-5 times a day for many years before they have symptoms, although it is possible to develop CHS with as little as 1 use per day.  Treatment for this condition involves stopping marijuana use. Hot showers and capsaicin creams may also help relieve symptoms. Ask your health care provider before starting any medicines or other treatments.  Your health care provider may prescribe medicines to help with nausea.  Get help right away if you have signs of dehydration, such as dry mouth, decreased urine production, or weakness. This information is not intended to replace advice given to you by your health care provider. Make sure you discuss any questions you have with your health care provider. Document Revised: 02/01/2018 Document Reviewed: 01/03/2017 Elsevier Patient Education  2020 [employer]  

## 2020-06-28 NOTE — MAU Note (Addendum)
.   Carmen [name] is a 29 y.o. at [redacted]w[redacted]d here in MAU reporting: she was sent from office for hydration due to hyperemesis. Denies any VB or pain. Pt states she has not taken any medications for nausea in 2 days LMP: 04/19/20 Onset of complaint: ongoing Pain score: 0 Vitals:   06/28/20 1518  BP: 106/70  Pulse: 87  Resp: 16  Temp: 98.7 F (37.1 C)  SpO2: 100%     FHT: Lab orders placed from triage: [facility]

## 2020-07-15 ENCOUNTER — Other Ambulatory Visit (HOSPITAL_COMMUNITY)
Admission: RE | Admit: 2020-07-15 | Discharge: 2020-07-15 | Disposition: A | Discharge status: Home / Self Care | Payer: Medicaid Other | Source: Ambulatory Visit | Attending: Obstetrics and Gynecology | Admitting: Obstetrics and Gynecology

## 2020-07-15 ENCOUNTER — Encounter: Payer: Self-pay | Admitting: Obstetrics and Gynecology

## 2020-07-15 ENCOUNTER — Ambulatory Visit (INDEPENDENT_AMBULATORY_CARE_PROVIDER_SITE_OTHER): Payer: Medicaid Other | Admitting: Obstetrics and Gynecology

## 2020-07-15 ENCOUNTER — Other Ambulatory Visit: Payer: Self-pay

## 2020-07-15 VITALS — BP 105/77 | HR 95 | Wt 130.5 lb

## 2020-07-15 DIAGNOSIS — Z86711 Personal history of pulmonary embolism: Secondary | ICD-10-CM

## 2020-07-15 DIAGNOSIS — Z3480 Encounter for supervision of other normal pregnancy, unspecified trimester: Secondary | ICD-10-CM

## 2020-07-15 DIAGNOSIS — O210 Mild hyperemesis gravidarum: Secondary | ICD-10-CM

## 2020-07-15 DIAGNOSIS — Z3481 Encounter for supervision of other normal pregnancy, first trimester: Secondary | ICD-10-CM | POA: Diagnosis not present

## 2020-07-15 DIAGNOSIS — Z8759 Personal history of other complications of pregnancy, childbirth and the puerperium: Secondary | ICD-10-CM

## 2020-07-15 MED ORDER — PROMETHAZINE HCL 25 MG RE SUPP: 25.0000 mg | Freq: Four times a day (QID) | RECTAL | 2 refills | Status: DC | PRN

## 2020-07-15 NOTE — Patient Instructions (Signed)
First Trimester of Pregnancy The first trimester of pregnancy is from week 1 until the end of week 13 (months 1 through 3). A week after a sperm fertilizes an egg, the egg will implant on the wall of the uterus. This embryo will begin to develop into a baby. Genes from you and your partner will form the baby. The female genes will determine whether the baby will be a boy or a girl. At 6-8 weeks, the eyes and face will be formed, and the heartbeat can be seen on ultrasound. At the end of 12 weeks, all the baby's organs will be formed. Now that you are pregnant, you will want to do everything you can to have a healthy baby. Two of the most important things are to get good prenatal care and to follow your health care provider's instructions. Prenatal care is all the medical care you receive before the baby's birth. This care will help prevent, find, and treat any problems during the pregnancy and childbirth. Body changes during your first trimester Your body goes through many changes during pregnancy. The changes vary from woman to woman.  You may gain or lose a couple of pounds at first.  You may feel sick to your stomach (nauseous) and you may throw up (vomit). If the vomiting is uncontrollable, call your health care provider.  You may tire easily.  You may develop headaches that can be relieved by medicines. All medicines should be approved by your health care provider.  You may urinate more often. Painful urination may mean you have a bladder infection.  You may develop heartburn as a result of your pregnancy.  You may develop constipation because certain hormones are causing the muscles that push stool through your intestines to slow down.  You may develop hemorrhoids or swollen veins (varicose veins).  Your breasts may begin to grow larger and become tender. Your nipples may stick out more, and the tissue that surrounds them (areola) may become darker.  Your gums may bleed and may be  sensitive to brushing and flossing.  Dark spots or blotches (chloasma, mask of pregnancy) may develop on your face. This will likely fade after the baby is born.  Your menstrual periods will stop.  You may have a loss of appetite.  You may develop cravings for certain kinds of food.  You may have changes in your emotions from day to day, such as being excited to be pregnant or being concerned that something may go wrong with the pregnancy and baby.  You may have more vivid and strange dreams.  You may have changes in your hair. These can include thickening of your hair, rapid growth, and changes in texture. Some women also have hair loss during or after pregnancy, or hair that feels dry or thin. Your hair will most likely return to normal after your baby is born. What to expect at prenatal visits During a routine prenatal visit:  You will be weighed to make sure you and the baby are growing normally.  Your blood pressure will be taken.  Your abdomen will be measured to track your baby's growth.  The fetal heartbeat will be listened to between weeks 10 and 14 of your pregnancy.  Test results from any previous visits will be discussed. Your health care provider may ask you:  How you are feeling.  If you are feeling the baby move.  If you have had any abnormal symptoms, such as leaking fluid, bleeding, severe headaches, or abdominal   cramping.  If you are using any tobacco products, including cigarettes, chewing tobacco, and electronic cigarettes.  If you have any questions. Other tests that may be performed during your first trimester include:  Blood tests to find your blood type and to check for the presence of any previous infections. The tests will also be used to check for low iron levels (anemia) and protein on red blood cells (Rh antibodies). Depending on your risk factors, or if you previously had diabetes during pregnancy, you may have tests to check for high blood sugar  that affects pregnant women (gestational diabetes).  Urine tests to check for infections, diabetes, or protein in the urine.  An ultrasound to confirm the proper growth and development of the baby.  Fetal screens for spinal cord problems (spina bifida) and Down syndrome.  HIV (human immunodeficiency virus) testing. Routine prenatal testing includes screening for HIV, unless you choose not to have this test.  You may need other tests to make sure you and the baby are doing well. Follow these instructions at home: Medicines  Follow your health care provider's instructions regarding medicine use. Specific medicines may be either safe or unsafe to take during pregnancy.  Take a prenatal vitamin that contains at least 600 micrograms (mcg) of folic acid.  If you develop constipation, try taking a stool softener if your health care provider approves. Eating and drinking   Eat a balanced diet that includes fresh fruits and vegetables, whole grains, good sources of protein such as meat, eggs, or tofu, and low-fat dairy. Your health care provider will help you determine the amount of weight gain that is right for you.  Avoid raw meat and uncooked cheese. These carry germs that can cause birth defects in the baby.  Eating four or five small meals rather than three large meals a day may help relieve nausea and vomiting. If you start to feel nauseous, eating a few soda crackers can be helpful. Drinking liquids between meals, instead of during meals, also seems to help ease nausea and vomiting.  Limit foods that are high in fat and processed sugars, such as fried and sweet foods.  To prevent constipation: ? Eat foods that are high in fiber, such as fresh fruits and vegetables, whole grains, and beans. ? Drink enough fluid to keep your urine clear or pale yellow. Activity  Exercise only as directed by your health care provider. Most women can continue their usual exercise routine during  pregnancy. Try to exercise for 30 minutes at least 5 days a week. Exercising will help you: ? Control your weight. ? Stay in shape. ? Be prepared for labor and delivery.  Experiencing pain or cramping in the lower abdomen or lower back is a good sign that you should stop exercising. Check with your health care provider before continuing with normal exercises.  Try to avoid standing for long periods of time. Move your legs often if you must stand in one place for a long time.  Avoid heavy lifting.  Wear low-heeled shoes and practice good posture.  You may continue to have sex unless your health care provider tells you not to. Relieving pain and discomfort  Wear a good support bra to relieve breast tenderness.  Take warm sitz baths to soothe any pain or discomfort caused by hemorrhoids. Use hemorrhoid cream if your health care provider approves.  Rest with your legs elevated if you have leg cramps or low back pain.  If you develop varicose veins in   your legs, wear support hose. Elevate your feet for 15 minutes, 3-4 times a day. Limit salt in your diet. Prenatal care  Schedule your prenatal visits by the twelfth week of pregnancy. They are usually scheduled monthly at first, then more often in the last 2 months before delivery.  Write down your questions. Take them to your prenatal visits.  Keep all your prenatal visits as told by your health care provider. This is important. Safety  Wear your seat belt at all times when driving.  Make a list of emergency phone numbers, including numbers for family, friends, the hospital, and police and fire departments. General instructions  Ask your health care provider for a referral to a local prenatal education class. Begin classes no later than the beginning of month 6 of your pregnancy.  Ask for help if you have counseling or nutritional needs during pregnancy. Your health care provider can offer advice or refer you to specialists for help  with various needs.  Do not use hot tubs, steam rooms, or saunas.  Do not douche or use tampons or scented sanitary pads.  Do not cross your legs for long periods of time.  Avoid cat litter boxes and soil used by cats. These carry germs that can cause birth defects in the baby and possibly loss of the fetus by miscarriage or stillbirth.  Avoid all smoking, herbs, alcohol, and medicines not prescribed by your health care provider. Chemicals in these products affect the formation and growth of the baby.  Do not use any products that contain nicotine or tobacco, such as cigarettes and e-cigarettes. If you need help quitting, ask your health care provider. You may receive counseling support and other resources to help you quit.  Schedule a dentist appointment. At home, brush your teeth with a soft toothbrush and be gentle when you floss. Contact a health care provider if:  You have dizziness.  You have mild pelvic cramps, pelvic pressure, or nagging pain in the abdominal area.  You have persistent nausea, vomiting, or diarrhea.  You have a bad smelling vaginal discharge.  You have pain when you urinate.  You notice increased swelling in your face, hands, legs, or ankles.  You are exposed to fifth disease or chickenpox.  You are exposed to German measles (rubella) and have never had it. Get help right away if:  You have a fever.  You are leaking fluid from your vagina.  You have spotting or bleeding from your vagina.  You have severe abdominal cramping or pain.  You have rapid weight gain or loss.  You vomit blood or material that looks like coffee grounds.  You develop a severe headache.  You have shortness of breath.  You have any kind of trauma, such as from a fall or a car accident. Summary  The first trimester of pregnancy is from week 1 until the end of week 13 (months 1 through 3).  Your body goes through many changes during pregnancy. The changes vary from  woman to woman.  You will have routine prenatal visits. During those visits, your health care provider will examine you, discuss any test results you may have, and talk with you about how you are feeling. This information is not intended to replace advice given to you by your health care provider. Make sure you discuss any questions you have with your health care provider. Document Revised: 09/07/2017 Document Reviewed: 09/06/2016 Elsevier Patient Education  2020 [employer]  

## 2020-07-16 ENCOUNTER — Encounter: Payer: Self-pay | Admitting: Obstetrics and Gynecology

## 2020-07-16 LAB — CERVICOVAGINAL ANCILLARY ONLY
Chlamydia: NEGATIVE
Comment: NEGATIVE
Comment: NORMAL
Neisseria Gonorrhea: NEGATIVE

## 2020-07-16 LAB — CBC/D/PLT+RPR+RH+ABO+RUB AB...
Antibody Screen: NEGATIVE
Basophils Absolute: 0 10*3/uL (ref 0.0–0.2)
Basos: 0 %
EOS (ABSOLUTE): 0.1 10*3/uL (ref 0.0–0.4)
Eos: 1 %
HCV Ab: <0.1 s/co ratio (ref 0.0–0.9)
HIV Screen 4th Generation wRfx: NONREACTIVE
Hematocrit: 34.5 % (ref 34.0–46.6)
Hemoglobin: 11.6 g/dL (ref 11.1–15.9)
Hepatitis B Surface Ag: NEGATIVE
Immature Grans (Abs): 0.1 10*3/uL (ref 0.0–0.1)
Immature Granulocytes: 1 %
Lymphocytes Absolute: 2 10*3/uL (ref 0.7–3.1)
Lymphs: 20 %
MCH: 29.7 pg (ref 26.6–33.0)
MCHC: 33.6 g/dL (ref 31.5–35.7)
MCV: 89 fL (ref 79–97)
Monocytes Absolute: 0.6 10*3/uL (ref 0.1–0.9)
Monocytes: 6 %
Neutrophils Absolute: 7.3 10*3/uL — ABNORMAL HIGH (ref 1.4–7.0)
Neutrophils: 72 %
Platelets: 250 10*3/uL (ref 150–450)
RBC: 3.9 x10E6/uL (ref 3.77–5.28)
RDW: 14.2 % (ref 11.7–15.4)
RPR Ser Ql: NONREACTIVE
Rh Factor: POSITIVE
Rubella Antibodies, IGG: 2.18 index (ref >0.99)
WBC: 10 10*3/uL (ref 3.4–10.8)

## 2020-07-16 LAB — TSH: TSH: 0.501 u[IU]/mL (ref 0.450–4.500)

## 2020-07-16 LAB — HCV INTERPRETATION

## 2020-07-16 LAB — HEMOGLOBIN A1C
Est. average glucose Bld gHb Est-mCnc: 117 mg/dL
Hgb A1c MFr Bld: 5.7 % — ABNORMAL HIGH (ref 4.8–5.6)

## 2020-07-16 NOTE — Progress Notes (Signed)
Subjective:  Carmen [name] is a 29 y.o. G2P1001 at [redacted]w[redacted]d being seen today for first OB visit. EDD by first trimester U/S. Has had problems with hyperemesis. Hospitalized from 9/6-9/8. She is doing much better. H/O PE with last pregnancy, currently of Lovenox.   She is currently monitored for the following issues for this high-risk pregnancy and has History of pulmonary embolus during previous pregnancy in 2019; Hyperemesis affecting pregnancy, antepartum; Ptyalism; and Supervision of other normal pregnancy, antepartum on their problem list.  Patient reports N/V.  Contractions: Not present. Vag. Bleeding: None.  Movement: Absent. Denies leaking of fluid.   The following portions of the patient's history were reviewed and updated as appropriate: allergies, current medications, past family history, past medical history, past social history, past surgical history and problem list. Problem list updated.  Objective:   Vitals:   07/15/20 1420  BP: 105/77  Pulse: 95  Weight: 130 lb 8 oz (59.2 kg)    Fetal Status: Fetal Heart Rate (bpm): 165   Movement: Absent     General:  Alert, oriented and cooperative. Patient is in no acute distress.  Skin: Skin is warm and dry. No rash noted.   Cardiovascular: Normal heart rate noted  Respiratory: Normal respiratory effort, no problems with respiration noted  Abdomen: Soft, gravid, appropriate for gestational age. Pain/Pressure: Absent     Pelvic:  Cervical exam deferred        Extremities: Normal range of motion.  Edema: None  Mental Status: Normal mood and affect. Normal behavior. Normal judgment and thought content.   Urinalysis:      Assessment and Plan:  Pregnancy: G2P1001 at [redacted]w[redacted]d  1. Supervision of other normal pregnancy, antepartum Prenatal care and labs reviewed with pt. Genetic testing reviewed - CBC/D/Plt+RPR+Rh+ABO+Rub Ab... - Culture, OB Urine - Genetic Screening - Hemoglobin A1c - TSH - Cervicovaginal ancillary only( [facility])  2. History of pulmonary embolus during previous pregnancy in 2019 Stable Continue with Lovenox  3. Hyperemesis affecting pregnancy, antepartum Improved  Preterm labor symptoms and general obstetric precautions including but not limited to vaginal bleeding, contractions, leaking of fluid and fetal movement were reviewed in detail with the patient. Please refer to After Visit Summary for other counseling recommendations.  Return in about 4 weeks (around 08/12/2020) for OB visit, face to face, MD only.   Hermina Staggers, MD

## 2020-07-17 LAB — CULTURE, OB URINE

## 2020-07-17 LAB — URINE CULTURE, OB REFLEX

## 2020-07-29 ENCOUNTER — Encounter: Payer: Self-pay | Admitting: General Practice

## 2020-08-05 ENCOUNTER — Other Ambulatory Visit: Payer: Self-pay

## 2020-08-05 ENCOUNTER — Encounter (HOSPITAL_COMMUNITY): Payer: Self-pay | Admitting: Obstetrics & Gynecology

## 2020-08-05 ENCOUNTER — Inpatient Hospital Stay (HOSPITAL_COMMUNITY)
Admission: AD | Admit: 2020-08-05 | Discharge: 2020-08-06 | Disposition: A | Discharge status: Home / Self Care | Payer: Medicaid Other | Attending: Obstetrics & Gynecology | Admitting: Obstetrics & Gynecology

## 2020-08-05 ENCOUNTER — Telehealth: Payer: Self-pay | Admitting: Lactation Services

## 2020-08-05 DIAGNOSIS — O219 Vomiting of pregnancy, unspecified: Secondary | ICD-10-CM

## 2020-08-05 DIAGNOSIS — O210 Mild hyperemesis gravidarum: Secondary | ICD-10-CM | POA: Insufficient documentation

## 2020-08-05 DIAGNOSIS — Z7901 Long term (current) use of anticoagulants: Secondary | ICD-10-CM | POA: Diagnosis not present

## 2020-08-05 DIAGNOSIS — Z3480 Encounter for supervision of other normal pregnancy, unspecified trimester: Secondary | ICD-10-CM

## 2020-08-05 DIAGNOSIS — R110 Nausea: Secondary | ICD-10-CM | POA: Diagnosis not present

## 2020-08-05 DIAGNOSIS — Z3A14 14 weeks gestation of pregnancy: Secondary | ICD-10-CM | POA: Insufficient documentation

## 2020-08-05 DIAGNOSIS — Z86718 Personal history of other venous thrombosis and embolism: Secondary | ICD-10-CM | POA: Diagnosis not present

## 2020-08-05 DIAGNOSIS — Z86711 Personal history of pulmonary embolism: Secondary | ICD-10-CM | POA: Insufficient documentation

## 2020-08-05 DIAGNOSIS — O26891 Other specified pregnancy related conditions, first trimester: Secondary | ICD-10-CM | POA: Diagnosis not present

## 2020-08-05 DIAGNOSIS — Z87891 Personal history of nicotine dependence: Secondary | ICD-10-CM | POA: Insufficient documentation

## 2020-08-05 LAB — COMPREHENSIVE METABOLIC PANEL
ALT: 9 U/L (ref 0–44)
AST: 16 U/L (ref 15–41)
Albumin: 3.2 g/dL — ABNORMAL LOW (ref 3.5–5.0)
Alkaline Phosphatase: 54 U/L (ref 38–126)
Anion gap: 11 (ref 5–15)
BUN: 5 mg/dL — ABNORMAL LOW (ref 6–20)
CO2: 22 mmol/L (ref 22–32)
Calcium: 9.2 mg/dL (ref 8.9–10.3)
Chloride: 104 mmol/L (ref 98–111)
Creatinine, Ser: 0.68 mg/dL (ref 0.44–1.00)
GFR, Estimated: >60 mL/min (ref >60)
Glucose, Bld: 84 mg/dL (ref 70–99)
Potassium: 3.5 mmol/L (ref 3.5–5.1)
Sodium: 137 mmol/L (ref 135–145)
Total Bilirubin: 0.6 mg/dL (ref 0.3–1.2)
Total Protein: 6.8 g/dL (ref 6.5–8.1)

## 2020-08-05 LAB — CBC
HCT: 36.6 % (ref 36.0–46.0)
Hemoglobin: 12.1 g/dL (ref 12.0–15.0)
MCH: 29.3 pg (ref 26.0–34.0)
MCHC: 33.1 g/dL (ref 30.0–36.0)
MCV: 88.6 fL (ref 80.0–100.0)
Platelets: 246 10*3/uL (ref 150–400)
RBC: 4.13 MIL/uL (ref 3.87–5.11)
RDW: 13.2 % (ref 11.5–15.5)
WBC: 7.9 10*3/uL (ref 4.0–10.5)
nRBC: 0 % (ref 0.0–0.2)

## 2020-08-05 LAB — URINALYSIS, ROUTINE W REFLEX MICROSCOPIC
Bilirubin Urine: NEGATIVE
Glucose, UA: NEGATIVE mg/dL
Hgb urine dipstick: NEGATIVE
Ketones, ur: NEGATIVE mg/dL
Nitrite: NEGATIVE
Protein, ur: NEGATIVE mg/dL
Specific Gravity, Urine: 1.017 (ref 1.005–1.030)
pH: 6 (ref 5.0–8.0)

## 2020-08-05 LAB — RAPID URINE DRUG SCREEN, HOSP PERFORMED
Amphetamines: NOT DETECTED
Barbiturates: NOT DETECTED
Benzodiazepines: NOT DETECTED
Cocaine: NOT DETECTED
Opiates: NOT DETECTED
Tetrahydrocannabinol: NOT DETECTED

## 2020-08-05 MED ORDER — LACTATED RINGERS IV BOLUS
1000.0000 mL | Freq: Once | INTRAVENOUS | Status: AC
  Administered 2020-08-05: 1000 mL via INTRAVENOUS

## 2020-08-05 MED ORDER — FAMOTIDINE IN NACL 20-0.9 MG/50ML-% IV SOLN
20.0000 mg | Freq: Once | INTRAVENOUS | Status: AC
  Administered 2020-08-05: 20 mg via INTRAVENOUS
  Filled 2020-08-05: qty 50

## 2020-08-05 MED ORDER — SCOPOLAMINE 1 MG/3DAYS TD PT72: 1.0000 | MEDICATED_PATCH | TRANSDERMAL | 1 refills | Status: DC

## 2020-08-05 MED ORDER — SODIUM CHLORIDE 0.9 % IV SOLN
8.0000 mg | Freq: Once | INTRAVENOUS | Status: AC
  Administered 2020-08-05: 8 mg via INTRAVENOUS
  Filled 2020-08-05: qty 4

## 2020-08-05 MED ORDER — PROMETHAZINE HCL 25 MG RE SUPP: 25.0000 mg | Freq: Four times a day (QID) | RECTAL | 2 refills | Status: DC | PRN

## 2020-08-05 MED ORDER — PROMETHAZINE HCL 25 MG/ML IJ SOLN
25.0000 mg | Freq: Once | INTRAMUSCULAR | Status: AC
  Administered 2020-08-05: 25 mg via INTRAVENOUS
  Filled 2020-08-05: qty 1

## 2020-08-05 NOTE — MAU Note (Signed)
Presents with c/o N/V, reports can't keep anything down, states taking meds but ran out of Phenergan suppositories.  Last time Phenergan taken was Tuesday.  Removed Scopalomine patch today, didn't replace it.

## 2020-08-05 NOTE — MAU Provider Note (Addendum)
History     CSN: [id number]  Arrival date and time: 08/05/20 1654   First Provider Initiated Contact with Patient 08/05/20 1847      Chief Complaint  Patient presents with  . Nausea  . Emesis   HPI  Ms.Carmen [name] is a 29 y.o. female G2P1001 @ [redacted]w[redacted]d with a history of cannabis hyperemesis, DVT on lovenox here with nausea and vomiting. Reports she has not energy to do anything and has had off and on abdominal pain. The pain is all over her abdomen and feels like "bulges". She was taking phenergan suppository,  however ran out of her prescription 2 days ago. Reports she cannot take Zofran because she cannot take a pill. Reports not being able to keep anything down in the last 24 hours. She took her scopolamine patch off and did not replace it.   Patient requesting to have a note to be out of work d/t symptoms.   OB History    Gravida  2   Para  1   Term  1   Preterm  0   AB  0   Living  1     SAB  0   TAB  0   Ectopic  0   Multiple  0   Live Births  1           Past Medical History:  Diagnosis Date  . Anemia   . Asthma   . Panic attacks   . Pulmonary embolism (HCC) 02/08/2018  . Pulmonary embolism, antepartum 02/08/2018   Indications: PE diagnosed 5/2@ [facility].  Left side, lower lobe;   Admitted 5/2-5/6 - heparin drip>lovenox 60BID.  ED evaluation 5/8 2/2 persistent SOB - negative DVT, nml ECG Anticoagulation goal is therapeutic  Therapeutic dose is Lovenox 1mg /kg SQ Q12--currently on 60 mg BID  Monitoring is indicated for patients on therapeutic anticoagulation  Will check Anti-Xa level 4-6 hours after 3rd dose Q trime    Past Surgical History:  Procedure Laterality Date  . NO PAST SURGERIES      Family History  Problem Relation Age of Onset  . Pulmonary embolism Mother   . Ulcers Sister     Social History   Tobacco Use  . Smoking status: Former Smoker    Types: Cigarettes  . Smokeless tobacco: Never Used  Vaping Use  . Vaping Use: Never used   Substance Use Topics  . Alcohol use: Not Currently  . Drug use: No    Allergies:  Allergies  Allergen Reactions  . Morphine And Related Anaphylaxis, Swelling and Other (See Comments)    "swells shut" the patient's throat    Medications Prior to Admission  Medication Sig Dispense Refill Last Dose  . enoxaparin (LOVENOX) 40 MG/0.4ML injection Inject 0.4 mLs (40 mg total) into the skin daily. 30 mL 2 Past Week at Unknown time  . promethazine (PHENERGAN) 25 MG suppository Place 1 suppository (25 mg total) rectally every 6 (six) hours as needed for nausea or vomiting. 12 each 2 Past Week at Unknown time  . albuterol (VENTOLIN HFA) 108 (90 Base) MCG/ACT inhaler Inhale 1-2 puffs into the lungs every 6 (six) hours as needed for wheezing or shortness of breath. 18 g 2   . Prenatal Vit-Fe Fumarate-FA (PRENATAL PO) Take 1 tablet by mouth daily.      Results for orders placed or performed during the hospital encounter of 08/05/20 (from the past 48 hour(s))  Urinalysis, Routine w reflex microscopic Urine, Clean Catch  Status: Abnormal   Collection Time: 08/05/20  6:30 PM  Result Value Ref Range   Color, Urine YELLOW YELLOW   APPearance HAZY (A) CLEAR   Specific Gravity, Urine 1.017 1.005 - 1.030   pH 6.0 5.0 - 8.0   Glucose, UA NEGATIVE NEGATIVE mg/dL   Hgb urine dipstick NEGATIVE NEGATIVE   Bilirubin Urine NEGATIVE NEGATIVE   Ketones, ur NEGATIVE NEGATIVE mg/dL   Protein, ur NEGATIVE NEGATIVE mg/dL   Nitrite NEGATIVE NEGATIVE   Leukocytes,Ua SMALL (A) NEGATIVE   RBC / HPF 0-5 0 - 5 RBC/hpf   WBC, UA 6-10 0 - 5 WBC/hpf   Bacteria, UA FEW (A) NONE SEEN   Squamous Epithelial / LPF 0-5 0 - 5   Mucus PRESENT     Comment: Performed at [facility], [address] [place], [place] [address]  CBC     Status: None   Collection Time: 08/05/20  7:14 PM  Result Value Ref Range   WBC 7.9 4.0 - 10.5 K/uL   RBC 4.13 3.87 - 5.11 MIL/uL   Hemoglobin 12.1 12.0 - 15.0 g/dL   HCT 93.7 36 -  46 %   MCV 88.6 80.0 - 100.0 fL   MCH 29.3 26.0 - 34.0 pg   MCHC 33.1 30.0 - 36.0 g/dL   RDW 90.2 40.9 - 73.5 %   Platelets 246 150 - 400 K/uL   nRBC 0.0 0.0 - 0.2 %    Comment: Performed at [facility], [address] [place], [place] [address]   Review of Systems  Constitutional: Negative for fever.  Gastrointestinal: Positive for abdominal pain, nausea and vomiting. Negative for diarrhea.  Genitourinary: Negative for pelvic pain.   Physical Exam   Blood pressure 106/74, pulse (!) 106, temperature 98.7 F (37.1 C), temperature source Oral, resp. rate 18, height 4\' 11"  (1.499 m), weight 57.2 kg, last menstrual period 04/19/2020, SpO2 99 %, unknown if currently breastfeeding.  Physical Exam Constitutional:      Appearance: She is ill-appearing. She is not toxic-appearing.  HENT:     Head: Normocephalic.  Abdominal:     Tenderness: There is generalized abdominal tenderness. There is no guarding or rebound.  Neurological:     Mental Status: She is alert.  Psychiatric:        Mood and Affect: Affect is flat and inappropriate.        Behavior: Behavior is slowed and withdrawn.    MAU Course  Procedures  None  MDM  + fetal heart tones via doppler  Weight 9/20: 53 kg, today 57 kg UA without severe dehydration.  Lr bolus X 1 Pepcid, Zofran, phenergan IV  Report given to 10/20, CNM who resumes care of the patient.   Wynelle Bourgeois, NP 08/05/2020 7:38 PM    Assessment and Plan  A:  Single IUP at [redacted]w[redacted]d       Nausea and vomiting of pregnancy  P  Discharge home      New Rx given for Scopolamine patches      Continue meds as scheduled      Advance diet as tolerated Encouraged to return here or to other Urgent Care/ED if she develops worsening of symptoms, increase in pain, fever, or other concerning symptoms.    [redacted]w[redacted]d, CNM

## 2020-08-05 NOTE — Telephone Encounter (Signed)
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 Reviewed that they will recommend FOB also be tested.   Patient reports she is on the way to the ED and not able to write down information. Reviewed I would send message to her My Chart. Patient voiced understanding.

## 2020-08-05 NOTE — Discharge Instructions (Signed)

## 2020-08-06 DIAGNOSIS — O26891 Other specified pregnancy related conditions, first trimester: Secondary | ICD-10-CM

## 2020-08-06 DIAGNOSIS — Z3A14 14 weeks gestation of pregnancy: Secondary | ICD-10-CM

## 2020-08-06 DIAGNOSIS — R110 Nausea: Secondary | ICD-10-CM

## 2020-08-06 DIAGNOSIS — O219 Vomiting of pregnancy, unspecified: Secondary | ICD-10-CM | POA: Diagnosis not present

## 2020-08-09 ENCOUNTER — Encounter: Payer: Self-pay | Admitting: Obstetrics and Gynecology

## 2020-08-09 DIAGNOSIS — D563 Thalassemia minor: Secondary | ICD-10-CM

## 2020-08-09 HISTORY — DX: Thalassemia minor: D56.3

## 2020-08-12 ENCOUNTER — Encounter: Payer: Self-pay | Admitting: Obstetrics and Gynecology

## 2020-08-12 ENCOUNTER — Ambulatory Visit (INDEPENDENT_AMBULATORY_CARE_PROVIDER_SITE_OTHER): Payer: Medicaid Other | Admitting: Obstetrics and Gynecology

## 2020-08-12 ENCOUNTER — Encounter: Payer: Self-pay | Admitting: Family Medicine

## 2020-08-12 ENCOUNTER — Other Ambulatory Visit: Payer: Self-pay

## 2020-08-12 VITALS — BP 115/81 | HR 101 | Wt 132.3 lb

## 2020-08-12 DIAGNOSIS — Z3A15 15 weeks gestation of pregnancy: Secondary | ICD-10-CM

## 2020-08-12 DIAGNOSIS — O210 Mild hyperemesis gravidarum: Secondary | ICD-10-CM

## 2020-08-12 DIAGNOSIS — F1210 Cannabis abuse, uncomplicated: Secondary | ICD-10-CM

## 2020-08-12 DIAGNOSIS — Z3480 Encounter for supervision of other normal pregnancy, unspecified trimester: Secondary | ICD-10-CM | POA: Diagnosis not present

## 2020-08-12 DIAGNOSIS — O0990 Supervision of high risk pregnancy, unspecified, unspecified trimester: Secondary | ICD-10-CM

## 2020-08-12 DIAGNOSIS — Z86711 Personal history of pulmonary embolism: Secondary | ICD-10-CM

## 2020-08-12 DIAGNOSIS — Z8759 Personal history of other complications of pregnancy, childbirth and the puerperium: Secondary | ICD-10-CM

## 2020-08-12 HISTORY — DX: Cannabis abuse, uncomplicated: F12.10

## 2020-08-12 MED ORDER — BLOOD PRESSURE KIT DEVI: 1.0000 | Freq: Once | 0 refills | Status: AC

## 2020-08-13 NOTE — Progress Notes (Signed)
Prenatal Visit Note Date: 08/12/2020 Clinic: [facility]  Subjective:  Brynli Ollis is a 29 y.o. G2P1001 at [redacted]w[redacted]d being seen today for ongoing prenatal care.  She is currently monitored for the following issues for this high-risk pregnancy and has History of pulmonary embolus during previous pregnancy in 2019; Hyperemesis affecting pregnancy, antepartum; Ptyalism; Supervision of other normal pregnancy, antepartum; Alpha thalassemia silent carrier; and Marijuana abuse on their problem list.  Patient reports see below.   Contractions: Not present. Vag. Bleeding: None.  Movement: Absent. Denies leaking of fluid.   The following portions of the patient's history were reviewed and updated as appropriate: allergies, current medications, past family history, past medical history, past social history, past surgical history and problem list. Problem list updated.  Objective:   Vitals:   08/12/20 1337  BP: 115/81  Pulse: (!) 101  Weight: 132 lb 4.8 oz (60 kg)    Fetal Status: Fetal Heart Rate (bpm): 156   Movement: Absent     General:  Alert, oriented and cooperative. Patient is in no acute distress.  Skin: Skin is warm and dry. No rash noted.   Cardiovascular: Normal heart rate noted  Respiratory: Normal respiratory effort, no problems with respiration noted  Abdomen: Soft, gravid, appropriate for gestational age. Pain/Pressure: Absent     Pelvic:  Cervical exam deferred        Extremities: Normal range of motion.  Edema: None  Mental Status: Normal mood and affect. Normal behavior. Normal judgment and thought content.   Urinalysis:      Assessment and Plan:  Pregnancy: G2P1001 at [redacted]w[redacted]d  1. Supervision of other normal pregnancy, antepartum Patient to be set up for anatomyu/s. Pt amenable to afp - Blood Pressure Monitoring (BLOOD PRESSURE KIT) DEVI; 1 Device by Does not apply route once for 1 dose.  Dispense: 1 each; Refill: 0 - AFP, Serum, Open Spina Bifida  2.  [redacted] weeks gestation of pregnancy  3. History of pulmonary embolus during previous pregnancy in 2019 Pt hasn't taken for past week due to stressors at home and work. I d/w her importance of this and ED precautions given  4. Hyperemesis affecting pregnancy, antepartum Patient feeling better and weight up some from last visit. Pt only using PRN phenergan suppositories and hasn't needed it for past few days. I told her to fill the Rx for the scope patch in case she needs it  5. Stressors Declines to see [place] today. Also having difficulty sleeping due to this. Behavorial interventions d/w her. Stressors related to work (6d/week schedule and what sounds like lack of flexibility from them) and stressors with FOB. Strategies d/w her including offering her our standard work note  Preterm labor symptoms and general obstetric precautions including but not limited to vaginal bleeding, contractions, leaking of fluid and fetal movement were reviewed in detail with the patient. Please refer to After Visit Summary for other counseling recommendations.  Return in about 1 month (around 09/11/2020) for in person, md or app.   Aletha Halim, MD

## 2020-08-14 LAB — AFP, SERUM, OPEN SPINA BIFIDA
AFP MoM: 0.94
AFP Value: 33.7 ng/mL
Gest. Age on Collection Date: 15.1 weeks
Maternal Age At EDD: 31.5 yr
OSBR Risk 1 IN: 10000
Test Results:: NEGATIVE
Weight: 132 [lb_av]

## 2020-08-21 ENCOUNTER — Inpatient Hospital Stay (HOSPITAL_COMMUNITY)
Admission: AD | Admit: 2020-08-21 | Discharge: 2020-08-21 | Disposition: A | Discharge status: Home / Self Care | Payer: Medicaid Other | Attending: Obstetrics and Gynecology | Admitting: Obstetrics and Gynecology

## 2020-08-21 ENCOUNTER — Other Ambulatory Visit: Payer: Self-pay

## 2020-08-21 ENCOUNTER — Encounter (HOSPITAL_COMMUNITY): Payer: Self-pay | Admitting: Obstetrics and Gynecology

## 2020-08-21 DIAGNOSIS — R531 Weakness: Secondary | ICD-10-CM | POA: Insufficient documentation

## 2020-08-21 DIAGNOSIS — Z3A16 16 weeks gestation of pregnancy: Secondary | ICD-10-CM | POA: Diagnosis not present

## 2020-08-21 DIAGNOSIS — Z87891 Personal history of nicotine dependence: Secondary | ICD-10-CM | POA: Insufficient documentation

## 2020-08-21 DIAGNOSIS — Z885 Allergy status to narcotic agent status: Secondary | ICD-10-CM | POA: Diagnosis not present

## 2020-08-21 DIAGNOSIS — Z7901 Long term (current) use of anticoagulants: Secondary | ICD-10-CM | POA: Diagnosis not present

## 2020-08-21 DIAGNOSIS — O219 Vomiting of pregnancy, unspecified: Secondary | ICD-10-CM | POA: Diagnosis present

## 2020-08-21 DIAGNOSIS — O26892 Other specified pregnancy related conditions, second trimester: Secondary | ICD-10-CM | POA: Insufficient documentation

## 2020-08-21 DIAGNOSIS — Z86711 Personal history of pulmonary embolism: Secondary | ICD-10-CM | POA: Diagnosis not present

## 2020-08-21 DIAGNOSIS — O210 Mild hyperemesis gravidarum: Secondary | ICD-10-CM | POA: Insufficient documentation

## 2020-08-21 LAB — COMPREHENSIVE METABOLIC PANEL
ALT: 9 U/L (ref 0–44)
AST: 14 U/L — ABNORMAL LOW (ref 15–41)
Albumin: 2.9 g/dL — ABNORMAL LOW (ref 3.5–5.0)
Alkaline Phosphatase: 45 U/L (ref 38–126)
Anion gap: 8 (ref 5–15)
BUN: 9 mg/dL (ref 6–20)
CO2: 22 mmol/L (ref 22–32)
Calcium: 8.7 mg/dL — ABNORMAL LOW (ref 8.9–10.3)
Chloride: 104 mmol/L (ref 98–111)
Creatinine, Ser: 0.49 mg/dL (ref 0.44–1.00)
GFR, Estimated: >60 mL/min (ref >60)
Glucose, Bld: 91 mg/dL (ref 70–99)
Potassium: 3.6 mmol/L (ref 3.5–5.1)
Sodium: 134 mmol/L — ABNORMAL LOW (ref 135–145)
Total Bilirubin: 0.5 mg/dL (ref 0.3–1.2)
Total Protein: 6.3 g/dL — ABNORMAL LOW (ref 6.5–8.1)

## 2020-08-21 LAB — URINALYSIS, ROUTINE W REFLEX MICROSCOPIC
Bacteria, UA: NONE SEEN
Bilirubin Urine: NEGATIVE
Glucose, UA: NEGATIVE mg/dL
Hgb urine dipstick: NEGATIVE
Ketones, ur: NEGATIVE mg/dL
Nitrite: NEGATIVE
Protein, ur: NEGATIVE mg/dL
Specific Gravity, Urine: 1.024 (ref 1.005–1.030)
pH: 6 (ref 5.0–8.0)

## 2020-08-21 LAB — MAGNESIUM: Magnesium: 1.7 mg/dL (ref 1.7–2.4)

## 2020-08-21 LAB — LIPASE, BLOOD: Lipase: 23 U/L (ref 11–51)

## 2020-08-21 MED ORDER — ONDANSETRON 4 MG PO TBDP: 4.0000 mg | ORAL_TABLET | Freq: Three times a day (TID) | ORAL | 0 refills | Status: DC | PRN

## 2020-08-21 MED ORDER — PROMETHAZINE HCL 25 MG/ML IJ SOLN
12.5000 mg | Freq: Once | INTRAMUSCULAR | Status: AC
  Administered 2020-08-21: 12.5 mg via INTRAVENOUS
  Filled 2020-08-21: qty 1

## 2020-08-21 MED ORDER — SCOPOLAMINE 1 MG/3DAYS TD PT72
1.0000 | MEDICATED_PATCH | Freq: Once | TRANSDERMAL | Status: DC
  Administered 2020-08-21: 1.5 mg via TRANSDERMAL
  Filled 2020-08-21: qty 1

## 2020-08-21 MED ORDER — LACTATED RINGERS IV BOLUS
1000.0000 mL | Freq: Once | INTRAVENOUS | Status: AC
  Administered 2020-08-21: 1000 mL via INTRAVENOUS

## 2020-08-21 NOTE — MAU Provider Note (Signed)
History     [id number]  Arrival date and time: 08/21/20 1504    Chief Complaint  Patient presents with  . Nausea  . Emesis  . Fatigue     HPI Semaj Kham is a 29 y.o. at [redacted]w[redacted]d by LMP with PMHx notable for hyperemesis gravidarum, marijuana use, silent alpha thal carrier, PE in prior pregnancy (on lovenox), who presents for nausea and vomitting. Patient has had multiple presentations to clinic and MAU for the like, she has also been admitted for this. Was previously doing okay at her last clinic visit 08/12/20. Since that time she has become progressively more nauseas and is unable to keep any food or liquid down. She denies diarrhea or constipation. No fever/chills. No urinary symptoms. No sick contacts. Denies marijuana use since finding out she was pregnant. She additionally notes feeling overall weak from not eating/drinking. She has not taken any of her home meds for nausea.   Review of discharge summary from last admission on 06/16/20: patient admitted 9/6-9/8 due to inability to tolerate PO and was given anti-emetics and fluids with improvement in symptoms, discharged tolerating PO.   O/Positive/-- (10/07 1458)  OB History    Gravida  2   Para  1   Term  1   Preterm  0   AB  0   Living  1     SAB  0   TAB  0   Ectopic  0   Multiple  0   Live Births  1           Past Medical History:  Diagnosis Date  . Anemia   . Asthma   . Panic attacks   . Pulmonary embolism, antepartum 02/08/2018   Indications: PE diagnosed 5/2@ [facility].  Left side, lower lobe;   Admitted 5/2-5/6 - heparin drip>lovenox 60BID.  ED evaluation 5/8 2/2 persistent SOB - negative DVT, nml ECG Anticoagulation goal is therapeutic  Therapeutic dose is Lovenox 1mg /kg SQ Q12--currently on 60 mg BID  Monitoring is indicated for patients on therapeutic anticoagulation  Will check Anti-Xa level 4-6 hours after 3rd dose Q trime    Past Surgical History:  Procedure Laterality Date  . NO PAST SURGERIES       Family History  Problem Relation Age of Onset  . Pulmonary embolism Mother   . Ulcers Sister     Social History   Socioeconomic History  . Marital status: Single    Spouse name: Not on file  . Number of children: Not on file  . Years of education: Not on file  . Highest education level: Not on file  Occupational History  . Not on file  Tobacco Use  . Smoking status: Former Smoker    Types: Cigarettes  . Smokeless tobacco: Never Used  Vaping Use  . Vaping Use: Never used  Substance and Sexual Activity  . Alcohol use: Not Currently  . Drug use: No  . Sexual activity: Yes  Other Topics Concern  . Not on file  Social History Narrative  . Not on file   Social Determinants of Health   Financial Resource Strain:   . Difficulty of Paying Living Expenses: Not on file  Food Insecurity: No Food Insecurity  . Worried About in the Last Year: Never true  . Ran Out of Food in the Last Year: Never true  Transportation Needs: No Transportation Needs  . Lack of Transportation (Medical): No  . Lack of Transportation (Non-Medical): No  Physical Activity:   . Days of Exercise per Week: Not on file  . Minutes of Exercise per Session: Not on file  Stress:   . Feeling of Stress : Not on file  Social Connections:   . Frequency of Communication with Friends and Family: Not on file  . Frequency of Social Gatherings with Friends and Family: Not on file  . Attends Religious Services: Not on file  . Active Member of Clubs or Organizations: Not on file  . Attends Banker Meetings: Not on file  . Marital Status: Not on file  Intimate Partner Violence:   . Fear of Current or Ex-Partner: Not on file  . Emotionally Abused: Not on file  . Physically Abused: Not on file  . Sexually Abused: Not on file    Allergies  Allergen Reactions  . Morphine And Related Anaphylaxis, Swelling and Other (See Comments)    "swells shut" the patient's throat    No  current facility-administered medications on file prior to encounter.   Current Outpatient Medications on File Prior to Encounter  Medication Sig Dispense Refill  . enoxaparin (LOVENOX) 40 MG/0.4ML injection Inject 0.4 mLs (40 mg total) into the skin daily. 30 mL 2  . albuterol (VENTOLIN HFA) 108 (90 Base) MCG/ACT inhaler Inhale 1-2 puffs into the lungs every 6 (six) hours as needed for wheezing or shortness of breath. 18 g 2  . Prenatal Vit-Fe Fumarate-FA (PRENATAL PO) Take 1 tablet by mouth daily.    . promethazine (PHENERGAN) 25 MG suppository Place 1 suppository (25 mg total) rectally every 6 (six) hours as needed for nausea or vomiting. 12 each 2  . scopolamine (TRANSDERM-SCOP, 1.5 MG,) 1 MG/3DAYS Place 1 patch (1.5 mg total) onto the skin every 3 (three) days. 4 patch 1     Review of Systems  Constitutional: Negative for chills and fever.  Eyes: Negative for blurred vision and double vision.  Respiratory: Negative for shortness of breath.   Cardiovascular: Negative for chest pain, palpitations and leg swelling.  Gastrointestinal: Positive for nausea and vomiting. Negative for abdominal pain, constipation, diarrhea and heartburn.  Genitourinary: Negative for dysuria, flank pain, hematuria and urgency.  Skin: Negative for itching and rash.  Neurological: Negative for dizziness, loss of consciousness, weakness and headaches.   Pertinent positives and negative per HPI, all others reviewed and negative  Physical Exam   BP 101/71 (BP Location: Right Arm)   Pulse 97   Temp 98.3 F (36.8 C) (Oral)   Resp 17   Ht 4' 11.02" (1.499 m)   Wt 59.1 kg   LMP 04/19/2020 (Approximate)   SpO2 99%   BMI 26.32 kg/m   Physical Exam Vitals and nursing note reviewed. Exam conducted with a chaperone present.  Constitutional:      General: She is not in acute distress.    Appearance: Normal appearance. She is normal weight.  HENT:     Head: Normocephalic and atraumatic.     Nose: Nose  normal.     Mouth/Throat:     Mouth: Mucous membranes are dry.     Pharynx: Oropharynx is clear.  Eyes:     Extraocular Movements: Extraocular movements intact.     Conjunctiva/sclera: Conjunctivae normal.  Cardiovascular:     Rate and Rhythm: Normal rate.     Pulses: Normal pulses.  Pulmonary:     Effort: Pulmonary effort is normal.  Musculoskeletal:        General: Normal range of motion.  Cervical back: Normal range of motion and neck supple.  Skin:    General: Skin is warm and dry.  Neurological:     General: No focal deficit present.     Mental Status: She is alert and oriented to person, place, and time. Mental status is at baseline.  Psychiatric:        Mood and Affect: Mood normal.        Behavior: Behavior normal.     Cervical Exam   not indicated  Bedside Ultrasound Not indicated  My interpretation: n/a  Labs No results found for this or any previous visit (from the past 24 hour(s)).  Imaging No results found.  MAU Course  Procedures  Lab Orders     Urinalysis, Routine w reflex microscopic Urine, Clean Catch No orders of the defined types were placed in this encounter.  Imaging Orders  No imaging studies ordered today    MDM moderate  Assessment and Plan  29yo G2P1001 at [redacted]w[redacted]d presents to MAU for recurrent nausea/emesis.  #hyperemesis gravidarum Patient presents with 6 days of nausea and multiple episodes of NBNB emesis daily, unable to quantify. She states she is unable to keep anything down. She has not taken any of her home nausea meds. She also endorses associated lightheadedness and dizziness. She has been unable to tolerate liquids. VSS. Exam unremarkable, except tachy mucous membranes. Lab work unremarkable. Patient given IV phenergan, scopolamine patch and LR bolus with improvement in symptoms. She has phenergan suppository and scopolamine refills at the pharmacy, notified to pick these up. Zofran sent to pharmacy as well. Patient given  instructions to take nausea medication around the clock to avoid recurrent nausea/emesis. She is instructed to eat small, frequent meals and continue to abstain from marijuana. Should she have recurrent symptoms she is instructed to call clinic for med refills.   Alric Seton

## 2020-08-21 NOTE — Discharge Instructions (Signed)
-taken all anti-emetics (nausea meds) as prescribed -small frequent meals high in protein -all lab work unremarkable -please call clinic if symptoms are continued so you can be seen for a sooner appt  Hyperemesis Gravidarum Hyperemesis gravidarum is a severe form of nausea and vomiting that happens during pregnancy. Hyperemesis is worse than morning sickness. It may cause you to have nausea or vomiting all day for many days. It may keep you from eating and drinking enough food and liquids, which can lead to dehydration, malnutrition, and weight loss. Hyperemesis usually occurs during the first half (the first 20 weeks) of pregnancy. It often goes away once a woman is in her second half of pregnancy. However, sometimes hyperemesis continues through an entire pregnancy. What are the causes? The cause of this condition is not known. It may be related to changes in chemicals (hormones) in the body during pregnancy, such as the high level of pregnancy hormone (human chorionic gonadotropin) or the increase in the female sex hormone (estrogen). What are the signs or symptoms? Symptoms of this condition include:  Nausea that does not go away.  Vomiting that does not allow you to keep any food down.  Weight loss.  Body fluid loss (dehydration).  Having no desire to eat, or not liking food that you have previously enjoyed. How is this diagnosed? This condition may be diagnosed based on:  A physical exam.  Your medical history.  Your symptoms.  Blood tests.  Urine tests. How is this treated? This condition is managed by controlling symptoms. This may include:  Following an eating plan. This can help lessen nausea and vomiting.  Taking prescription medicines. An eating plan and medicines are often used together to help control symptoms. If medicines do not help relieve nausea and vomiting, you may need to receive fluids through an IV at the hospital. Follow these instructions at  home: Eating and drinking   Avoid the following: ? Drinking fluids with meals. Try not to drink anything during the 30 minutes before and after your meals. ? Drinking more than 1 cup of fluid at a time. ? Eating foods that trigger your symptoms. These may include spicy foods, coffee, high-fat foods, very sweet foods, and acidic foods. ? Skipping meals. Nausea can be more intense on an empty stomach. If you cannot tolerate food, do not force it. Try sucking on ice chips or other frozen items and make up for missed calories later. ? Lying down within 2 hours after eating. ? Being exposed to environmental triggers. These may include food smells, smoky rooms, closed spaces, rooms with strong smells, warm or humid places, overly loud and noisy rooms, and rooms with motion or flickering lights. Try eating meals in a well-ventilated area that is free of strong smells. ? Quick and sudden changes in your movement. ? Taking iron pills and multivitamins that contain iron. If you take prescription iron pills, do not stop taking them unless your health care provider approves. ? Preparing food. The smell of food can spoil your appetite or trigger nausea.  To help relieve your symptoms: ? Listen to your body. Everyone is different and has different preferences. Find what works best for you. ? Eat and drink slowly. ? Eat 5-6 small meals daily instead of 3 large meals. Eating small meals and snacks can help you avoid an empty stomach. ? In the morning, before getting out of bed, eat a couple of crackers to avoid moving around on an empty stomach. ? Try eating  starchy foods as these are usually tolerated well. Examples include cereal, toast, bread, potatoes, pasta, rice, and pretzels. ? Include at least 1 serving of protein with your meals and snacks. Protein options include lean meats, poultry, seafood, beans, nuts, nut butters, eggs, cheese, and yogurt. ? Try eating a protein-rich snack before bed. Examples of  a protein-rick snack include cheese and crackers or a peanut butter sandwich made with 1 slice of whole-wheat bread and 1 tsp (5 g) of peanut butter. ? Eat or suck on things that have ginger in them. It may help relieve nausea. Add  tsp ground ginger to hot tea or choose ginger tea. ? Try drinking 100% fruit juice or an electrolyte drink. An electrolyte drink contains sodium, potassium, and chloride. ? Drink fluids that are cold, clear, and carbonated or sour. Examples include lemonade, ginger ale, lemon-lime soda, ice water, and sparkling water. ? Brush your teeth or use a mouth rinse after meals. ? Talk with your health care provider about starting a supplement of vitamin B6. General instructions  Take over-the-counter and prescription medicines only as told by your health care provider.  Follow instructions from your health care provider about eating or drinking restrictions.  Continue to take your prenatal vitamins as told by your health care provider. If you are having trouble taking your prenatal vitamins, talk with your health care provider about different options.  Keep all follow-up and pre-birth (prenatal) visits as told by your health care provider. This is important. Contact a health care provider if:  You have pain in your abdomen.  You have a severe headache.  You have vision problems.  You are losing weight.  You feel weak or dizzy. Get help right away if:  You cannot drink fluids without vomiting.  You vomit blood.  You have constant nausea and vomiting.  You are very weak.  You faint.  You have a fever and your symptoms suddenly get worse. Summary  Hyperemesis gravidarum is a severe form of nausea and vomiting that happens during pregnancy.  Making some changes to your eating habits may help relieve nausea and vomiting.  This condition may be managed with medicine.  If medicines do not help relieve nausea and vomiting, you may need to receive fluids  through an IV at the hospital. This information is not intended to replace advice given to you by your health care provider. Make sure you discuss any questions you have with your health care provider. Document Revised: 10/15/2017 Document Reviewed: 05/24/2016 Elsevier Patient Education  2020 [employer].

## 2020-08-21 NOTE — MAU Note (Signed)
Pt reports that she continues to feel weak but that the nausea has improved. No episodes of vomiting since arrival. Pt was provided with sprite and able to tolerate. Pt states that she feels that she could be discharged to home. Discussed the BRAT diet and eating small frequent meals or snacks throughout the day.

## 2020-08-21 NOTE — MAU Note (Signed)
Pt presents with reports of continued nausea and vomiting with progressive weakness over the last week. States she has had multiple visits for nausea and vomiting through the pregnancy. States during her last visit she was given an IV drip or medication that increased her energy level x5 days. Pt denies contractions, cramping, loss of fluid, vaginal bleeding or bloody show. Beginning to feel baby move.

## 2020-08-25 ENCOUNTER — Inpatient Hospital Stay (HOSPITAL_COMMUNITY)
Admission: AD | Admit: 2020-08-25 | Discharge: 2020-08-25 | Disposition: A | Discharge status: Home / Self Care | Payer: Medicaid Other | Attending: Obstetrics and Gynecology | Admitting: Obstetrics and Gynecology

## 2020-08-25 ENCOUNTER — Other Ambulatory Visit: Payer: Self-pay

## 2020-08-25 ENCOUNTER — Encounter (HOSPITAL_COMMUNITY): Payer: Self-pay | Admitting: Obstetrics and Gynecology

## 2020-08-25 DIAGNOSIS — O210 Mild hyperemesis gravidarum: Secondary | ICD-10-CM | POA: Insufficient documentation

## 2020-08-25 DIAGNOSIS — Z87891 Personal history of nicotine dependence: Secondary | ICD-10-CM | POA: Insufficient documentation

## 2020-08-25 DIAGNOSIS — Z3A17 17 weeks gestation of pregnancy: Secondary | ICD-10-CM

## 2020-08-25 DIAGNOSIS — Z79899 Other long term (current) drug therapy: Secondary | ICD-10-CM | POA: Insufficient documentation

## 2020-08-25 DIAGNOSIS — O26892 Other specified pregnancy related conditions, second trimester: Secondary | ICD-10-CM | POA: Diagnosis not present

## 2020-08-25 DIAGNOSIS — R079 Chest pain, unspecified: Secondary | ICD-10-CM | POA: Insufficient documentation

## 2020-08-25 DIAGNOSIS — K920 Hematemesis: Secondary | ICD-10-CM | POA: Diagnosis present

## 2020-08-25 LAB — URINALYSIS, ROUTINE W REFLEX MICROSCOPIC
Bilirubin Urine: NEGATIVE
Glucose, UA: NEGATIVE mg/dL
Ketones, ur: 80 mg/dL — AB
Nitrite: NEGATIVE
Protein, ur: 30 mg/dL — AB
Specific Gravity, Urine: 1.026 (ref 1.005–1.030)
pH: 5 (ref 5.0–8.0)

## 2020-08-25 MED ORDER — M.V.I. ADULT IV INJ
Freq: Once | INTRAVENOUS | Status: AC
  Filled 2020-08-25: qty 1000

## 2020-08-25 MED ORDER — ONDANSETRON 4 MG PO TBDP
4.0000 mg | ORAL_TABLET | Freq: Three times a day (TID) | ORAL | 0 refills | Status: DC | PRN
Start: 2020-08-25 — End: 2020-11-08

## 2020-08-25 MED ORDER — GLYCOPYRROLATE 1 MG PO TABS
1.0000 mg | ORAL_TABLET | Freq: Three times a day (TID) | ORAL | 0 refills | Status: DC
Start: 2020-08-25 — End: 2020-11-08

## 2020-08-25 MED ORDER — FAMOTIDINE IN NACL 20-0.9 MG/50ML-% IV SOLN
20.0000 mg | Freq: Once | INTRAVENOUS | Status: AC
  Administered 2020-08-25: 20 mg via INTRAVENOUS
  Filled 2020-08-25: qty 50

## 2020-08-25 MED ORDER — PROMETHAZINE HCL 25 MG/ML IJ SOLN
25.0000 mg | Freq: Once | INTRAVENOUS | Status: AC
  Administered 2020-08-25: 25 mg via INTRAVENOUS
  Filled 2020-08-25: qty 1

## 2020-08-25 NOTE — MAU Provider Note (Signed)
History     CSN: [id number]  Arrival date and time: 08/25/20 1626   First Provider Initiated Contact with Patient 08/25/20 1754      Chief Complaint  Patient presents with  . Nausea  . Emesis    Blood tinged  . Chest Pain    L side   [name] is a 29 y.o. year old G33P1001 female at [redacted]w[redacted]d weeks gestation who presents to MAU reporting N/V tinged with dark brown that turned bright, red bleeding that started this afternoon. She reports that she is having on-going dull, sharp pain on the LT of her chest for the past 3 wks. She reports good (+) FM. She denies VB, abnormal vaginal discharge, or LOF. Her pregnancy is significant for h/o PE @ 10 wks in 1st pregnancy. She takes Phenergan BID and wears a Scopolamine patch for N/V. She states she was "supposed to be Rx'd something else for N/V, but the pharmacy never had the Rx."  She receives [facility]; next appt is 09/13/2020.   OB History    Gravida  2   Para  1   Term  1   Preterm  0   AB  0   Living  1     SAB  0   TAB  0   Ectopic  0   Multiple  0   Live Births  1           Past Medical History:  Diagnosis Date  . Anemia   . Asthma   . Panic attacks   . Pulmonary embolism, antepartum 02/08/2018   Indications: PE diagnosed 5/2@ [facility].  Left side, lower lobe;   Admitted 5/2-5/6 - heparin drip>lovenox 60BID.  ED evaluation 5/8 2/2 persistent SOB - negative DVT, nml ECG Anticoagulation goal is therapeutic  Therapeutic dose is Lovenox 1mg /kg SQ Q12--currently on 60 mg BID  Monitoring is indicated for patients on therapeutic anticoagulation  Will check Anti-Xa level 4-6 hours after 3rd dose Q trime    Past Surgical History:  Procedure Laterality Date  . NO PAST SURGERIES      Family History  Problem Relation Age of Onset  . Pulmonary embolism Mother   . Ulcers Sister     Social History   Tobacco Use  . Smoking status: Former Smoker    Types: Cigarettes  . Smokeless tobacco: Never Used  Vaping Use   . Vaping Use: Never used  Substance Use Topics  . Alcohol use: Not Currently  . Drug use: No    Allergies:  Allergies  Allergen Reactions  . Morphine And Related Anaphylaxis, Swelling and Other (See Comments)    "swells shut" the patient's throat    Medications Prior to Admission  Medication Sig Dispense Refill Last Dose  . enoxaparin (LOVENOX) 40 MG/0.4ML injection Inject 0.4 mLs (40 mg total) into the skin daily. 30 mL 2 08/24/2020 at Unknown time  . promethazine (PHENERGAN) 25 MG suppository Place 1 suppository (25 mg total) rectally every 6 (six) hours as needed for nausea or vomiting. 12 each 2 08/24/2020 at Unknown time  . scopolamine (TRANSDERM-SCOP, 1.5 MG,) 1 MG/3DAYS Place 1 patch (1.5 mg total) onto the skin every 3 (three) days. 4 patch 1 08/25/2020 at Unknown time  . albuterol (VENTOLIN HFA) 108 (90 Base) MCG/ACT inhaler Inhale 1-2 puffs into the lungs every 6 (six) hours as needed for wheezing or shortness of breath. 18 g 2 More than a month at Unknown time  .  ondansetron (ZOFRAN ODT) 4 MG disintegrating tablet Take 1 tablet (4 mg total) by mouth every 8 (eight) hours as needed for nausea or vomiting. 20 tablet 0   . Prenatal Vit-Fe Fumarate-FA (PRENATAL PO) Take 1 tablet by mouth daily.       Review of Systems  Constitutional: Negative.   HENT: Negative.   Eyes: Negative.   Respiratory: Negative.   Cardiovascular: Negative.   Gastrointestinal: Positive for abdominal pain, nausea and vomiting.       Increased salivation  Endocrine: Negative.   Genitourinary: Negative.   Musculoskeletal: Negative.   Skin: Negative.   Allergic/Immunologic: Negative.   Neurological: Negative.   Hematological: Negative.   Psychiatric/Behavioral: Negative.    Physical Exam   Blood pressure 103/64, pulse 99, temperature 98.1 F (36.7 C), temperature source Oral, resp. rate 16, weight 56.8 kg, last menstrual period 04/19/2020, SpO2 100 %, unknown if currently  breastfeeding.  Physical Exam Vitals and nursing note reviewed.  Constitutional:      Appearance: She is normal weight. She is ill-appearing.  HENT:     Head: Normocephalic and atraumatic.  Cardiovascular:     Rate and Rhythm: Normal rate.  Pulmonary:     Effort: Pulmonary effort is normal.  Abdominal:     General: Abdomen is flat.  Genitourinary:    Comments: Not indicated Musculoskeletal:        General: Normal range of motion.     Cervical back: Normal range of motion.  Skin:    General: Skin is warm and dry.  Neurological:     Mental Status: She is alert and oriented to person, place, and time.  Psychiatric:        Mood and Affect: Mood normal.        Behavior: Behavior normal.        Thought Content: Thought content normal.        Judgment: Judgment normal.    FHTs by doppler: 150 bpm  MAU Course  Procedures  MDM CCUA EKG -- Normal Sinus Rhythm/Normal EKG IVFs: Phenergan 25 mg in LR 1000 ml @ 999 ml/hr; followed by MVI in LR 1000 ml @ 999 ml/hr -- no nausea/vomiting Pepcid 20 mg IVPB  Results for orders placed or performed during the hospital encounter of 08/25/20 (from the past 24 hour(s))  Urinalysis, Routine w reflex microscopic Urine, Clean Catch     Status: Abnormal   Collection Time: 08/25/20  4:40 PM  Result Value Ref Range   Color, Urine AMBER (A) YELLOW   APPearance CLOUDY (A) CLEAR   Specific Gravity, Urine 1.026 1.005 - 1.030   pH 5.0 5.0 - 8.0   Glucose, UA NEGATIVE NEGATIVE mg/dL   Hgb urine dipstick MODERATE (A) NEGATIVE   Bilirubin Urine NEGATIVE NEGATIVE   Ketones, ur 80 (A) NEGATIVE mg/dL   Protein, ur 30 (A) NEGATIVE mg/dL   Nitrite NEGATIVE NEGATIVE   Leukocytes,Ua MODERATE (A) NEGATIVE   RBC / HPF 21-50 0 - 5 RBC/hpf   WBC, UA 21-50 0 - 5 WBC/hpf   Bacteria, UA RARE (A) NONE SEEN   Squamous Epithelial / LPF 11-20 0 - 5   Mucus PRESENT        Assessment and Plan  Hyperemesis affecting pregnancy, antepartum - Plan: Discharge  patient - Rx reprinted for Zofran 4 mg ODT - Rx for Robinul 1 mg TID for increased salivation/spitting - Information provided on hyperemesis gravidarum - Keep scheduled appt on 09/13/2020 - Message to [facility] Clinical Pool to get  patient scheduled for infusion clinic. - Patient verbalized an understanding of the plan of care and agrees.    Raelyn Mora, MSN, CNM 08/25/2020, 5:54 PM

## 2020-08-25 NOTE — MAU Note (Signed)
Pt c/o nausea and vomiting tinged with dark brown, then bright red blood beginning this afternoon. Pt reports ongoing intermittent dull and sharp chest pain on the L side for the last three weeks, has history of PE at approx. 10 weeks with her first pregnancy.  +FM, denies vaginal bleeding, discharge or LOF.

## 2020-08-27 ENCOUNTER — Telehealth: Payer: Self-pay | Admitting: *Deleted

## 2020-08-27 ENCOUNTER — Other Ambulatory Visit: Payer: Self-pay | Admitting: Obstetrics and Gynecology

## 2020-08-27 DIAGNOSIS — O210 Mild hyperemesis gravidarum: Secondary | ICD-10-CM

## 2020-08-27 NOTE — Telephone Encounter (Signed)
I called [facility] [phone] and scheduled for first available Tuesday 09/07/20 at 1200 and 12/ 2/21 at 1200 and will schedule others when she comes to first appointment. States needs orders faxed or placed in Epic. Will have provider or another staff to enter orders.  I called [facility] and left a message we were calling to let you know of appointments we have scheduled for you. I will send a detailed MyChart message. Please let us know if you have questions by responding to the message or calling us. Kaleya Douse,RN

## 2020-08-27 NOTE — Telephone Encounter (Signed)
-----   Message from [place], [place] sent at 08/25/2020 10:10 PM EST ----- Regarding: Please schedule for infusion center Please get this patient scheduled for Tuesdays and Thursdays for infusion of LR 1000 ml with Phenergan 25 mg at 999 ml/hr. She comes to MAU about every 3-4 days "needing IVFs." She has Rx for Phenergan, Scope patch and Zofran.  Raelyn Mora, CNM

## 2020-08-30 ENCOUNTER — Other Ambulatory Visit: Payer: Self-pay | Admitting: Obstetrics and Gynecology

## 2020-08-30 NOTE — Progress Notes (Signed)
Orders for phenergn infusion

## 2020-09-07 ENCOUNTER — Encounter (HOSPITAL_COMMUNITY)
Admission: RE | Admit: 2020-09-07 | Discharge: 2020-09-07 | Disposition: A | Discharge status: Home / Self Care | Payer: Medicaid Other | Source: Ambulatory Visit | Attending: Family Medicine | Admitting: Family Medicine

## 2020-09-07 ENCOUNTER — Other Ambulatory Visit: Payer: Self-pay

## 2020-09-07 DIAGNOSIS — O210 Mild hyperemesis gravidarum: Secondary | ICD-10-CM | POA: Diagnosis present

## 2020-09-07 MED ORDER — PROMETHAZINE HCL 25 MG/ML IJ SOLN
25.0000 mg | INTRAVENOUS | Status: DC
  Administered 2020-09-07: 25 mg via INTRAVENOUS
  Filled 2020-09-07: qty 1

## 2020-09-09 ENCOUNTER — Ambulatory Visit: Payer: Medicaid Other | Admitting: *Deleted

## 2020-09-09 ENCOUNTER — Other Ambulatory Visit: Payer: Self-pay | Admitting: *Deleted

## 2020-09-09 ENCOUNTER — Other Ambulatory Visit: Payer: Self-pay

## 2020-09-09 ENCOUNTER — Ambulatory Visit: Payer: Medicaid Other | Attending: Obstetrics and Gynecology

## 2020-09-09 ENCOUNTER — Encounter: Payer: Self-pay | Admitting: *Deleted

## 2020-09-09 ENCOUNTER — Encounter (HOSPITAL_COMMUNITY)
Admission: RE | Admit: 2020-09-09 | Discharge: 2020-09-09 | Disposition: A | Discharge status: Home / Self Care | Payer: Medicaid Other | Source: Ambulatory Visit | Attending: Family Medicine | Admitting: Family Medicine

## 2020-09-09 DIAGNOSIS — Z3480 Encounter for supervision of other normal pregnancy, unspecified trimester: Secondary | ICD-10-CM | POA: Diagnosis present

## 2020-09-09 DIAGNOSIS — O210 Mild hyperemesis gravidarum: Secondary | ICD-10-CM | POA: Diagnosis present

## 2020-09-09 DIAGNOSIS — Z86711 Personal history of pulmonary embolism: Secondary | ICD-10-CM

## 2020-09-09 DIAGNOSIS — O0990 Supervision of high risk pregnancy, unspecified, unspecified trimester: Secondary | ICD-10-CM | POA: Insufficient documentation

## 2020-09-09 MED ORDER — PROMETHAZINE HCL 25 MG/ML IJ SOLN
25.0000 mg | INTRAVENOUS | Status: DC
  Administered 2020-09-09: 25 mg via INTRAVENOUS
  Filled 2020-09-09: qty 1

## 2020-09-10 ENCOUNTER — Encounter (HOSPITAL_COMMUNITY): Payer: Medicaid Other

## 2020-09-12 ENCOUNTER — Encounter: Payer: Self-pay | Admitting: Obstetrics and Gynecology

## 2020-09-13 ENCOUNTER — Other Ambulatory Visit: Payer: Self-pay

## 2020-09-13 ENCOUNTER — Ambulatory Visit (INDEPENDENT_AMBULATORY_CARE_PROVIDER_SITE_OTHER): Payer: Medicaid Other | Admitting: Obstetrics & Gynecology

## 2020-09-13 VITALS — BP 103/71 | HR 98 | Wt 135.1 lb

## 2020-09-13 DIAGNOSIS — Z3A19 19 weeks gestation of pregnancy: Secondary | ICD-10-CM

## 2020-09-13 DIAGNOSIS — Z8759 Personal history of other complications of pregnancy, childbirth and the puerperium: Secondary | ICD-10-CM

## 2020-09-13 DIAGNOSIS — F1210 Cannabis abuse, uncomplicated: Secondary | ICD-10-CM

## 2020-09-13 DIAGNOSIS — Z86711 Personal history of pulmonary embolism: Secondary | ICD-10-CM

## 2020-09-13 DIAGNOSIS — O358XX0 Maternal care for other (suspected) fetal abnormality and damage, not applicable or unspecified: Secondary | ICD-10-CM

## 2020-09-13 DIAGNOSIS — O0990 Supervision of high risk pregnancy, unspecified, unspecified trimester: Secondary | ICD-10-CM

## 2020-09-13 DIAGNOSIS — O210 Mild hyperemesis gravidarum: Secondary | ICD-10-CM

## 2020-09-13 DIAGNOSIS — IMO0001 Reserved for inherently not codable concepts without codable children: Secondary | ICD-10-CM

## 2020-09-13 MED ORDER — ENOXAPARIN SODIUM 40 MG/0.4ML ~~LOC~~ SOLN
40.0000 mg | SUBCUTANEOUS | 4 refills | Status: DC
Start: 2020-09-13 — End: 2021-01-17

## 2020-09-13 NOTE — Progress Notes (Signed)
   PRENATAL VISIT NOTE  Subjective:  Carmen [name] is a 29 y.o. G2P1001 at [redacted]w[redacted]d being seen today for ongoing prenatal care.  She is currently monitored for the following issues for this high-risk pregnancy and has History of pulmonary embolus during previous pregnancy in 2019; Hyperemesis affecting pregnancy, antepartum; Ptyalism; Supervision of high risk pregnancy, antepartum; Alpha thalassemia silent carrier; and Marijuana abuse on their problem list.  Patient reports less emesis for the last three weeks.  She is throwing maybe a once a day and this is a significant improvement.  .  Contractions: Not present. Vag. Bleeding: None.  Movement: Present. Denies leaking of fluid.   The following portions of the patient's history were reviewed and updated as appropriate: allergies, current medications, past family history, past medical history, past social history, past surgical history and problem list.   Objective:   Vitals:   09/13/20 1409  BP: 103/71  Pulse: 98  Weight: 135 lb 1.6 oz (61.3 kg)    Fetal Status: Fetal Heart Rate (bpm): 148   Movement: Present     General:  Alert, oriented and cooperative. Patient is in no acute distress.  Skin: Skin is warm and dry. No rash noted.   Cardiovascular: Normal heart rate noted  Respiratory: Normal respiratory effort, no problems with respiration noted  Abdomen: Soft, gravid, appropriate for gestational age.  Pain/Pressure: Present     Pelvic: Cervical exam deferred        Extremities: Normal range of motion.  Edema: None  Mental Status: Normal mood and affect. Normal behavior. Normal judgment and thought content.   Assessment and Plan:  Pregnancy: G2P1001 at [redacted]w[redacted]d 1. [redacted] weeks gestation of pregnancy -AFP normal at last visit  2. Hyperemesis affecting pregnancy, antepartum -Has done better with nausea/emesis the past three weeks -Twice weekly fluid/phenergan infusion  3. History of pulmonary embolus during previous pregnancy in  2019 -RF for Lovenox 40mg  sq daily.  #30/4RF   4. Marijuana abuse -Not smoking in pregnancy  5. Supervision of high risk pregnancy, antepartum  6. Fetal cardiac echogenic focus, single or unspecified fetus -Declined amniocentesis, NIPS testing negative   Preterm labor symptoms and general obstetric precautions including but not limited to vaginal bleeding, contractions, leaking of fluid and fetal movement were reviewed in detail with the patient. Please refer to After Visit Summary for other counseling recommendations.   Return in about 4 weeks (around 10/11/2020) for Office ob visit (MD or APP).  Future Appointments  Date Time Provider Department Center  09/14/2020 12:00 PM [facility] None  09/16/2020 12:00 PM [facility] None  10/21/2020 11:00 AM [facility] NURSE [facility]  10/21/2020 11:15 AM [facility] US2 [facility]    [facility] MD

## 2020-09-14 ENCOUNTER — Encounter (HOSPITAL_COMMUNITY)
Admission: RE | Admit: 2020-09-14 | Discharge: 2020-09-14 | Disposition: A | Discharge status: Home / Self Care | Payer: Medicaid Other | Source: Ambulatory Visit | Attending: Family Medicine | Admitting: Family Medicine

## 2020-09-14 DIAGNOSIS — O210 Mild hyperemesis gravidarum: Secondary | ICD-10-CM | POA: Diagnosis not present

## 2020-09-14 MED ORDER — PROMETHAZINE HCL 25 MG/ML IJ SOLN
25.0000 mg | INTRAVENOUS | Status: DC
  Administered 2020-09-14: 25 mg via INTRAVENOUS
  Filled 2020-09-14: qty 1

## 2020-09-16 ENCOUNTER — Inpatient Hospital Stay (HOSPITAL_COMMUNITY): Admission: RE | Admit: 2020-09-16 | Payer: Medicaid Other | Source: Ambulatory Visit

## 2020-09-21 ENCOUNTER — Encounter (HOSPITAL_COMMUNITY)
Admission: RE | Admit: 2020-09-21 | Discharge: 2020-09-21 | Disposition: A | Discharge status: Home / Self Care | Payer: Medicaid Other | Source: Ambulatory Visit | Attending: Obstetrics and Gynecology | Admitting: Obstetrics and Gynecology

## 2020-09-21 MED ORDER — PROMETHAZINE HCL 25 MG/ML IJ SOLN
25.0000 mg | INTRAVENOUS | Status: DC
  Filled 2020-09-21: qty 1

## 2020-10-09 NOTE — L&D Delivery Note (Signed)
Delivery Note Called to bedside and patient complete and pushing. At 1:29 AM a viable female was delivered via Vaginal, Spontaneous (Presentation: direct OA).  APGAR:9,9; weight 2381g.   Placenta status: Spontaneous, Intact.  Cord: 3 vessels with the following complications: None. TXA given after delivery in the setting of history of PPH.  Anesthesia: Epidural Episiotomy: None Lacerations: 2nd degree Suture Repair: 3.0 vicryl Est. Blood Loss (mL): 289   Mom to postpartum.  Baby to Couplet care / Skin to Skin.  Alric Seton 01/15/2021, 1:58 AM

## 2020-10-11 ENCOUNTER — Ambulatory Visit: Payer: Medicaid Other

## 2020-10-11 ENCOUNTER — Ambulatory Visit (INDEPENDENT_AMBULATORY_CARE_PROVIDER_SITE_OTHER): Payer: Medicaid Other | Admitting: Obstetrics & Gynecology

## 2020-10-11 ENCOUNTER — Other Ambulatory Visit: Payer: Self-pay

## 2020-10-11 VITALS — BP 113/72 | HR 86 | Wt 141.1 lb

## 2020-10-11 DIAGNOSIS — F1210 Cannabis abuse, uncomplicated: Secondary | ICD-10-CM

## 2020-10-11 DIAGNOSIS — Z8759 Personal history of other complications of pregnancy, childbirth and the puerperium: Secondary | ICD-10-CM

## 2020-10-11 DIAGNOSIS — O210 Mild hyperemesis gravidarum: Secondary | ICD-10-CM

## 2020-10-11 DIAGNOSIS — Z3A23 23 weeks gestation of pregnancy: Secondary | ICD-10-CM | POA: Diagnosis not present

## 2020-10-11 DIAGNOSIS — D563 Thalassemia minor: Secondary | ICD-10-CM

## 2020-10-11 DIAGNOSIS — Z86711 Personal history of pulmonary embolism: Secondary | ICD-10-CM

## 2020-10-11 DIAGNOSIS — O0990 Supervision of high risk pregnancy, unspecified, unspecified trimester: Secondary | ICD-10-CM

## 2020-10-11 NOTE — Progress Notes (Signed)
   PRENATAL VISIT NOTE  Subjective:  Carmen [name] is a 30 y.o. G2P1001 at [redacted]w[redacted]d being seen today for ongoing prenatal care.  She is currently monitored for the following issues for this high-risk pregnancy and has History of pulmonary embolus during previous pregnancy in 2019; Hyperemesis affecting pregnancy, antepartum; Ptyalism; Supervision of high risk pregnancy, antepartum; Alpha thalassemia silent carrier; and Marijuana abuse on their problem list.  Patient reports no complaints.  Nausea is a lot better.  Didn't have infusion in mid December because entire household was sick.  Appt was rescheduled but she wasn't aware of this.  Missed the 09/21/2020.  Would like to continue but have these done weekly.  Contractions: Irritability. Vag. Bleeding: None.  Movement: Present. Denies leaking of fluid.   The following portions of the patient's history were reviewed and updated as appropriate: allergies, current medications, past family history, past medical history, past social history, past surgical history and problem list.   Objective:   Vitals:   10/11/20 1336  BP: 113/72  Pulse: 86  Weight: 141 lb 1.6 oz (64 kg)    Fetal Status: Fetal Heart Rate (bpm): 152 Fundal Height: 24 cm Movement: Present     General:  Alert, oriented and cooperative. Patient is in no acute distress.  Skin: Skin is warm and dry. No rash noted.   Cardiovascular: Normal heart rate noted  Respiratory: Normal respiratory effort, no problems with respiration noted  Abdomen: Soft, gravid, appropriate for gestational age.  Pain/Pressure: Present     Pelvic: Cervical exam deferred        Extremities: Normal range of motion.  Edema: None  Mental Status: Normal mood and affect. Normal behavior. Normal judgment and thought content.   Assessment and Plan:  Pregnancy: G2P1001 at [redacted]w[redacted]d 1. Supervision of high risk pregnancy, antepartum - On PNV - Return 4 weeks.  Will have 28 week labs/GTT/RPR/CBC done then.  Tdap  discussed today.  Pt plans on receiving it.  2. [redacted] weeks gestation of pregnancy  3. Hyperemesis affecting pregnancy, antepartum -will get phenergan/IVF infusion scheduled.  Plan to decrease to weekly.  4. History of pulmonary embolus during previous pregnancy in 2019 - on Lovenox 40mg  SQ -Has growth scan scheduled 10/21/2020.  Plan to continue serial growth scans per MFM.  5. Marijuana abuse -Pt has not used since finding out she was pregnant  6. Alpha thalassemia silent carrier  Preterm labor symptoms and general obstetric precautions including but not limited to vaginal bleeding, contractions, leaking of fluid and fetal movement were reviewed in detail with the patient. Please refer to After Visit Summary for other counseling recommendations.   Return in about 4 weeks (around 11/08/2020) for 2 hr GTT, Office Ob visit (MD only).  Future Appointments  Date Time Provider Department Center  10/21/2020 11:00 AM [facility] NURSE [facility]  10/21/2020 11:15 AM [facility] US2 [facility]    [facility] MD

## 2020-10-12 ENCOUNTER — Telehealth: Payer: Self-pay

## 2020-10-12 ENCOUNTER — Encounter: Payer: Self-pay | Admitting: *Deleted

## 2020-10-12 NOTE — Telephone Encounter (Signed)
Appt scheduled with infusion center for IV fluids with Phenergan on 10/20/19 at 1000; pt notified.

## 2020-10-14 ENCOUNTER — Other Ambulatory Visit: Payer: Self-pay | Admitting: Obstetrics & Gynecology

## 2020-10-14 MED ORDER — PROMETHAZINE HCL 25 MG/ML IJ SOLN: 25.0000 mg | Freq: Once | INTRAVENOUS | Status: DC

## 2020-10-18 ENCOUNTER — Other Ambulatory Visit: Payer: Self-pay | Admitting: Obstetrics & Gynecology

## 2020-10-18 DIAGNOSIS — Z3A24 24 weeks gestation of pregnancy: Secondary | ICD-10-CM

## 2020-10-18 DIAGNOSIS — O210 Mild hyperemesis gravidarum: Secondary | ICD-10-CM

## 2020-10-18 MED ORDER — SODIUM CHLORIDE 0.9 % IV SOLN: 25.0000 mg | Freq: Once | INTRAVENOUS | Status: DC

## 2020-10-19 ENCOUNTER — Encounter (HOSPITAL_COMMUNITY)
Admission: RE | Admit: 2020-10-19 | Discharge: 2020-10-19 | Disposition: A | Discharge status: Home / Self Care | Payer: Medicaid Other | Source: Ambulatory Visit | Attending: Family Medicine | Admitting: Family Medicine

## 2020-10-19 ENCOUNTER — Other Ambulatory Visit (HOSPITAL_COMMUNITY): Payer: Self-pay | Admitting: *Deleted

## 2020-10-19 DIAGNOSIS — O210 Mild hyperemesis gravidarum: Secondary | ICD-10-CM | POA: Diagnosis not present

## 2020-10-19 MED ORDER — SODIUM CHLORIDE 0.9 % IV SOLN
25.0000 mg | Freq: Once | INTRAVENOUS | Status: AC
  Administered 2020-10-19: 25 mg via INTRAVENOUS
  Filled 2020-10-19: qty 1

## 2020-10-21 ENCOUNTER — Other Ambulatory Visit: Payer: Self-pay | Admitting: Obstetrics

## 2020-10-21 ENCOUNTER — Other Ambulatory Visit: Payer: Self-pay

## 2020-10-21 ENCOUNTER — Encounter: Payer: Self-pay | Admitting: *Deleted

## 2020-10-21 ENCOUNTER — Other Ambulatory Visit: Payer: Self-pay | Admitting: *Deleted

## 2020-10-21 ENCOUNTER — Ambulatory Visit: Payer: Medicaid Other | Attending: Obstetrics and Gynecology

## 2020-10-21 ENCOUNTER — Ambulatory Visit: Payer: Medicaid Other

## 2020-10-21 ENCOUNTER — Ambulatory Visit: Payer: Medicaid Other | Admitting: *Deleted

## 2020-10-21 DIAGNOSIS — O0990 Supervision of high risk pregnancy, unspecified, unspecified trimester: Secondary | ICD-10-CM

## 2020-10-21 DIAGNOSIS — Z148 Genetic carrier of other disease: Secondary | ICD-10-CM | POA: Diagnosis not present

## 2020-10-21 DIAGNOSIS — O365920 Maternal care for other known or suspected poor fetal growth, second trimester, not applicable or unspecified: Secondary | ICD-10-CM

## 2020-10-21 DIAGNOSIS — D563 Thalassemia minor: Secondary | ICD-10-CM | POA: Diagnosis not present

## 2020-10-21 DIAGNOSIS — O09292 Supervision of pregnancy with other poor reproductive or obstetric history, second trimester: Secondary | ICD-10-CM

## 2020-10-21 DIAGNOSIS — O365990 Maternal care for other known or suspected poor fetal growth, unspecified trimester, not applicable or unspecified: Secondary | ICD-10-CM

## 2020-10-21 DIAGNOSIS — Z3A25 25 weeks gestation of pregnancy: Secondary | ICD-10-CM | POA: Diagnosis not present

## 2020-10-21 DIAGNOSIS — Z86711 Personal history of pulmonary embolism: Secondary | ICD-10-CM

## 2020-10-21 DIAGNOSIS — O358XX0 Maternal care for other (suspected) fetal abnormality and damage, not applicable or unspecified: Secondary | ICD-10-CM | POA: Diagnosis not present

## 2020-10-22 LAB — INFECT DISEASE AB IGM REFLEX 1

## 2020-10-22 LAB — TOXOPLASMA GONDII ANTIBODY, IGG: Toxoplasma IgG Ratio: <3 IU/mL (ref 0.0–7.1)

## 2020-10-22 LAB — CMV ANTIBODY, IGG (EIA): CMV Ab - IgG: 5.3 U/mL — ABNORMAL HIGH (ref 0.00–0.59)

## 2020-10-22 LAB — CMV IGM: CMV IgM Ser EIA-aCnc: <30 AU/mL (ref 0.0–29.9)

## 2020-10-22 LAB — TOXOPLASMA GONDII ANTIBODY, IGM: Toxoplasma Antibody- IgM: 5 AU/mL (ref 0.0–7.9)

## 2020-10-26 ENCOUNTER — Ambulatory Visit (HOSPITAL_COMMUNITY): Payer: Medicaid Other

## 2020-11-05 ENCOUNTER — Encounter: Payer: Self-pay | Admitting: *Deleted

## 2020-11-05 ENCOUNTER — Ambulatory Visit: Payer: Medicaid Other | Admitting: *Deleted

## 2020-11-05 ENCOUNTER — Other Ambulatory Visit: Payer: Self-pay

## 2020-11-05 ENCOUNTER — Ambulatory Visit: Payer: Medicaid Other | Attending: Obstetrics and Gynecology

## 2020-11-05 DIAGNOSIS — Z3A27 27 weeks gestation of pregnancy: Secondary | ICD-10-CM

## 2020-11-05 DIAGNOSIS — O359XX0 Maternal care for (suspected) fetal abnormality and damage, unspecified, not applicable or unspecified: Secondary | ICD-10-CM

## 2020-11-05 DIAGNOSIS — O99322 Drug use complicating pregnancy, second trimester: Secondary | ICD-10-CM | POA: Diagnosis not present

## 2020-11-05 DIAGNOSIS — O0990 Supervision of high risk pregnancy, unspecified, unspecified trimester: Secondary | ICD-10-CM

## 2020-11-05 DIAGNOSIS — O09292 Supervision of pregnancy with other poor reproductive or obstetric history, second trimester: Secondary | ICD-10-CM | POA: Diagnosis not present

## 2020-11-05 DIAGNOSIS — O365920 Maternal care for other known or suspected poor fetal growth, second trimester, not applicable or unspecified: Secondary | ICD-10-CM | POA: Diagnosis not present

## 2020-11-05 DIAGNOSIS — O365990 Maternal care for other known or suspected poor fetal growth, unspecified trimester, not applicable or unspecified: Secondary | ICD-10-CM | POA: Diagnosis present

## 2020-11-05 DIAGNOSIS — F1910 Other psychoactive substance abuse, uncomplicated: Secondary | ICD-10-CM

## 2020-11-08 ENCOUNTER — Other Ambulatory Visit: Payer: Medicaid Other

## 2020-11-08 ENCOUNTER — Other Ambulatory Visit: Payer: Self-pay

## 2020-11-08 ENCOUNTER — Encounter: Payer: Self-pay | Admitting: *Deleted

## 2020-11-08 ENCOUNTER — Encounter: Payer: Self-pay | Admitting: Obstetrics and Gynecology

## 2020-11-08 ENCOUNTER — Ambulatory Visit (INDEPENDENT_AMBULATORY_CARE_PROVIDER_SITE_OTHER): Payer: Medicaid Other | Admitting: Obstetrics and Gynecology

## 2020-11-08 VITALS — BP 118/70 | HR 94 | Wt 144.6 lb

## 2020-11-08 DIAGNOSIS — O365990 Maternal care for other known or suspected poor fetal growth, unspecified trimester, not applicable or unspecified: Secondary | ICD-10-CM | POA: Insufficient documentation

## 2020-11-08 DIAGNOSIS — D563 Thalassemia minor: Secondary | ICD-10-CM

## 2020-11-08 DIAGNOSIS — O0990 Supervision of high risk pregnancy, unspecified, unspecified trimester: Secondary | ICD-10-CM

## 2020-11-08 DIAGNOSIS — Z23 Encounter for immunization: Secondary | ICD-10-CM | POA: Diagnosis not present

## 2020-11-08 DIAGNOSIS — Z86711 Personal history of pulmonary embolism: Secondary | ICD-10-CM

## 2020-11-08 DIAGNOSIS — Z3009 Encounter for other general counseling and advice on contraception: Secondary | ICD-10-CM

## 2020-11-08 DIAGNOSIS — O210 Mild hyperemesis gravidarum: Secondary | ICD-10-CM

## 2020-11-08 DIAGNOSIS — Z8759 Personal history of other complications of pregnancy, childbirth and the puerperium: Secondary | ICD-10-CM

## 2020-11-08 DIAGNOSIS — O365920 Maternal care for other known or suspected poor fetal growth, second trimester, not applicable or unspecified: Secondary | ICD-10-CM

## 2020-11-08 NOTE — Progress Notes (Signed)
Subjective:  Carmen [name] is a 30 y.o. G2P1001 at [redacted]w[redacted]d being seen today for ongoing prenatal care.  She is currently monitored for the following issues for this high-risk pregnancy and has History of pulmonary embolus during previous pregnancy in 2019; Hyperemesis affecting pregnancy, antepartum; Ptyalism; Supervision of high risk pregnancy, antepartum; Alpha thalassemia silent carrier; Marijuana abuse; IUGR (intrauterine growth restriction) affecting care of mother; and Unwanted fertility on their problem list.  Patient reports general discomforts of pregnancy. Only occ N/V now. Eating much better.  Contractions: Irritability. Vag. Bleeding: None.  Movement: Present. Denies leaking of fluid.   The following portions of the patient's history were reviewed and updated as appropriate: allergies, current medications, past family history, past medical history, past social history, past surgical history and problem list. Problem list updated.  Objective:   Vitals:   11/08/20 0923  BP: 118/70  Pulse: 94  Weight: 144 lb 9.6 oz (65.6 kg)    Fetal Status: Fetal Heart Rate (bpm): 156   Movement: Present     General:  Alert, oriented and cooperative. Patient is in no acute distress.  Skin: Skin is warm and dry. No rash noted.   Cardiovascular: Normal heart rate noted  Respiratory: Normal respiratory effort, no problems with respiration noted  Abdomen: Soft, gravid, appropriate for gestational age. Pain/Pressure: Present     Pelvic:  Cervical exam deferred        Extremities: Normal range of motion.  Edema: None  Mental Status: Normal mood and affect. Normal behavior. Normal judgment and thought content.   Urinalysis:      Assessment and Plan:  Pregnancy: G2P1001 at [redacted]w[redacted]d  1. Supervision of high risk pregnancy, antepartum Stable Glucola today Decline Flu and Covid vaccine - Tdap vaccine greater than or equal to 7yo IM  2. History of pulmonary embolus during previous pregnancy in  2019 Stable Continue with Lovenox and serial growth scans as per MFM  3. Hyperemesis affecting pregnancy, antepartum Much improved  4. Alpha thalassemia silent carrier Stable  5. Poor fetal growth affecting management of mother in second trimester, single or unspecified fetus See last U/S note. Toxo/CMV negative Serial growth and doppler scans as per MFM  6. Unwanted fertility BTL papers signed today  Preterm labor symptoms and general obstetric precautions including but not limited to vaginal bleeding, contractions, leaking of fluid and fetal movement were reviewed in detail with the patient. Please refer to After Visit Summary for other counseling recommendations.  Return in about 3 weeks (around 11/29/2020) for virtual, OB visit, any provider.   Hermina Staggers, MD

## 2020-11-08 NOTE — Progress Notes (Signed)
btl signed

## 2020-11-08 NOTE — Patient Instructions (Signed)

## 2020-11-09 LAB — GLUCOSE TOLERANCE, 2 HOURS W/ 1HR
Glucose, 1 hour: 114 mg/dL (ref 65–179)
Glucose, 2 hour: 110 mg/dL (ref 65–152)
Glucose, Fasting: 83 mg/dL (ref 65–91)

## 2020-11-09 LAB — RPR: RPR Ser Ql: NONREACTIVE

## 2020-11-09 LAB — CBC
Hematocrit: 28.5 % — ABNORMAL LOW (ref 34.0–46.6)
Hemoglobin: 9.6 g/dL — ABNORMAL LOW (ref 11.1–15.9)
MCH: 27 pg (ref 26.6–33.0)
MCHC: 33.7 g/dL (ref 31.5–35.7)
MCV: 80 fL (ref 79–97)
Platelets: 193 10*3/uL (ref 150–450)
RBC: 3.56 x10E6/uL — ABNORMAL LOW (ref 3.77–5.28)
RDW: 12.4 % (ref 11.7–15.4)
WBC: 7.7 10*3/uL (ref 3.4–10.8)

## 2020-11-09 LAB — HIV ANTIBODY (ROUTINE TESTING W REFLEX): HIV Screen 4th Generation wRfx: NONREACTIVE

## 2020-11-11 ENCOUNTER — Other Ambulatory Visit: Payer: Self-pay

## 2020-11-11 ENCOUNTER — Encounter: Payer: Self-pay | Admitting: *Deleted

## 2020-11-11 ENCOUNTER — Ambulatory Visit: Payer: Medicaid Other | Attending: Obstetrics and Gynecology

## 2020-11-11 ENCOUNTER — Ambulatory Visit: Payer: Medicaid Other | Admitting: *Deleted

## 2020-11-11 ENCOUNTER — Other Ambulatory Visit: Payer: Self-pay | Admitting: Obstetrics and Gynecology

## 2020-11-11 DIAGNOSIS — O359XX3 Maternal care for (suspected) fetal abnormality and damage, unspecified, fetus 3: Secondary | ICD-10-CM | POA: Diagnosis not present

## 2020-11-11 DIAGNOSIS — O0990 Supervision of high risk pregnancy, unspecified, unspecified trimester: Secondary | ICD-10-CM

## 2020-11-11 DIAGNOSIS — O365990 Maternal care for other known or suspected poor fetal growth, unspecified trimester, not applicable or unspecified: Secondary | ICD-10-CM | POA: Diagnosis not present

## 2020-11-11 DIAGNOSIS — F1910 Other psychoactive substance abuse, uncomplicated: Secondary | ICD-10-CM

## 2020-11-11 DIAGNOSIS — O99323 Drug use complicating pregnancy, third trimester: Secondary | ICD-10-CM | POA: Diagnosis not present

## 2020-11-11 DIAGNOSIS — O365930 Maternal care for other known or suspected poor fetal growth, third trimester, not applicable or unspecified: Secondary | ICD-10-CM | POA: Diagnosis not present

## 2020-11-11 DIAGNOSIS — O09293 Supervision of pregnancy with other poor reproductive or obstetric history, third trimester: Secondary | ICD-10-CM

## 2020-11-11 DIAGNOSIS — Z3A28 28 weeks gestation of pregnancy: Secondary | ICD-10-CM

## 2020-11-11 NOTE — Procedures (Signed)
Charleene Callegari [birth year] [redacted]w[redacted]d  Fetus A Non-Stress Test Interpretation for 11/11/20  Indication: IUGR  Fetal Heart Rate A Mode: External Baseline Rate (A): 145 bpm Variability: Moderate Accelerations: 10 x 10 Decelerations: None Multiple birth?: No  Uterine Activity Mode: Palpation,Toco Contraction Frequency (min): Erratic UI Contraction Quality: Mild Resting Tone Palpated: Relaxed Resting Time: Adequate  Interpretation (Fetal Testing) Nonstress Test Interpretation: Reactive Overall Impression: Reassuring for gestational age Comments: Dr. Grace Bushy reviewed tracing.

## 2020-11-12 ENCOUNTER — Telehealth: Payer: Self-pay

## 2020-11-12 NOTE — Telephone Encounter (Addendum)
-----   Message from Hermina Staggers, MD sent at 11/11/2020  1:19 PM EST ----- Please send in Rx for iron supplement. Pt is aware. Thanks Michael   Pt notified that the provider recommendation to start taking iron for anemia and that the Rx has been sent to her [facility] pharmacy on [address].  Pt encouraged to take with OJ if possible and to monitor for sx's of constipation.  Pt verbalized understanding.   Addison Naegeli, RN  11/16/20

## 2020-11-15 ENCOUNTER — Other Ambulatory Visit: Payer: Self-pay | Admitting: *Deleted

## 2020-11-15 DIAGNOSIS — O365930 Maternal care for other known or suspected poor fetal growth, third trimester, not applicable or unspecified: Secondary | ICD-10-CM

## 2020-11-16 MED ORDER — FERROUS SULFATE 325 (65 FE) MG PO TABS: 325.0000 mg | ORAL_TABLET | Freq: Every day | ORAL | 3 refills | Status: DC

## 2020-11-17 ENCOUNTER — Other Ambulatory Visit: Payer: Self-pay

## 2020-11-17 ENCOUNTER — Ambulatory Visit (HOSPITAL_BASED_OUTPATIENT_CLINIC_OR_DEPARTMENT_OTHER): Payer: Medicaid Other | Admitting: *Deleted

## 2020-11-17 ENCOUNTER — Ambulatory Visit: Payer: Medicaid Other | Attending: Obstetrics and Gynecology

## 2020-11-17 ENCOUNTER — Encounter: Payer: Self-pay | Admitting: *Deleted

## 2020-11-17 ENCOUNTER — Ambulatory Visit: Payer: Medicaid Other | Admitting: *Deleted

## 2020-11-17 DIAGNOSIS — O365930 Maternal care for other known or suspected poor fetal growth, third trimester, not applicable or unspecified: Secondary | ICD-10-CM

## 2020-11-17 DIAGNOSIS — Z3A29 29 weeks gestation of pregnancy: Secondary | ICD-10-CM | POA: Diagnosis not present

## 2020-11-17 DIAGNOSIS — O365990 Maternal care for other known or suspected poor fetal growth, unspecified trimester, not applicable or unspecified: Secondary | ICD-10-CM | POA: Insufficient documentation

## 2020-11-17 DIAGNOSIS — O09293 Supervision of pregnancy with other poor reproductive or obstetric history, third trimester: Secondary | ICD-10-CM | POA: Diagnosis not present

## 2020-11-17 DIAGNOSIS — O0990 Supervision of high risk pregnancy, unspecified, unspecified trimester: Secondary | ICD-10-CM

## 2020-11-17 DIAGNOSIS — Z148 Genetic carrier of other disease: Secondary | ICD-10-CM

## 2020-11-17 DIAGNOSIS — O359XX0 Maternal care for (suspected) fetal abnormality and damage, unspecified, not applicable or unspecified: Secondary | ICD-10-CM | POA: Diagnosis not present

## 2020-11-17 DIAGNOSIS — O99323 Drug use complicating pregnancy, third trimester: Secondary | ICD-10-CM

## 2020-11-17 DIAGNOSIS — F1910 Other psychoactive substance abuse, uncomplicated: Secondary | ICD-10-CM

## 2020-11-17 NOTE — Procedures (Signed)
Carmen [name] [birth year] [redacted]w[redacted]d  Fetus A Non-Stress Test Interpretation for 11/17/20  Indication: IUGR  Fetal Heart Rate A Mode: External Baseline Rate (A): 145 bpm Variability: Moderate Accelerations: 10 x 10 Decelerations: None Multiple birth?: No  Uterine Activity Mode: Palpation,Toco Contraction Frequency (min): ui Contraction Duration (sec): 10-20 Contraction Quality: Mild Resting Tone Palpated: Relaxed Resting Time: Adequate  Interpretation (Fetal Testing) Nonstress Test Interpretation: Reactive Overall Impression: Reassuring for gestational age Comments: Dr. Judeth Cornfield reviewed tracing.

## 2020-11-24 ENCOUNTER — Other Ambulatory Visit: Payer: Self-pay

## 2020-11-24 ENCOUNTER — Ambulatory Visit: Payer: Medicaid Other | Admitting: *Deleted

## 2020-11-24 ENCOUNTER — Encounter: Payer: Self-pay | Admitting: *Deleted

## 2020-11-24 ENCOUNTER — Ambulatory Visit: Payer: Medicaid Other | Attending: Maternal & Fetal Medicine

## 2020-11-24 DIAGNOSIS — O09293 Supervision of pregnancy with other poor reproductive or obstetric history, third trimester: Secondary | ICD-10-CM | POA: Diagnosis not present

## 2020-11-24 DIAGNOSIS — O352XX0 Maternal care for (suspected) hereditary disease in fetus, not applicable or unspecified: Secondary | ICD-10-CM | POA: Diagnosis not present

## 2020-11-24 DIAGNOSIS — O365930 Maternal care for other known or suspected poor fetal growth, third trimester, not applicable or unspecified: Secondary | ICD-10-CM | POA: Insufficient documentation

## 2020-11-24 DIAGNOSIS — Z148 Genetic carrier of other disease: Secondary | ICD-10-CM

## 2020-11-24 DIAGNOSIS — F1910 Other psychoactive substance abuse, uncomplicated: Secondary | ICD-10-CM

## 2020-11-24 DIAGNOSIS — O0990 Supervision of high risk pregnancy, unspecified, unspecified trimester: Secondary | ICD-10-CM | POA: Insufficient documentation

## 2020-11-24 DIAGNOSIS — Z3A30 30 weeks gestation of pregnancy: Secondary | ICD-10-CM

## 2020-11-24 DIAGNOSIS — O359XX0 Maternal care for (suspected) fetal abnormality and damage, unspecified, not applicable or unspecified: Secondary | ICD-10-CM

## 2020-11-24 DIAGNOSIS — O99323 Drug use complicating pregnancy, third trimester: Secondary | ICD-10-CM

## 2020-11-24 NOTE — Procedures (Signed)
Carmen [name] 1991-07-17 [redacted]w[redacted]d  Fetus A Non-Stress Test Interpretation for 11/24/20  Indication: SGA  Fetal Heart Rate A Mode: External Baseline Rate (A): 140 bpm Variability: Moderate Accelerations: 10 x 10 Decelerations: None Multiple birth?: No  Uterine Activity Mode: Palpation,Toco Contraction Frequency (min): 4 min Contraction Duration (sec): 50 Contraction Quality: Mild Resting Tone Palpated: Relaxed Resting Time: Adequate  Interpretation (Fetal Testing) Nonstress Test Interpretation: Reactive Overall Impression: Reassuring for gestational age Comments: Dr. Grace Bushy reviewed tracing.

## 2020-12-01 ENCOUNTER — Telehealth (INDEPENDENT_AMBULATORY_CARE_PROVIDER_SITE_OTHER): Payer: Medicaid Other | Admitting: Obstetrics and Gynecology

## 2020-12-01 DIAGNOSIS — O0990 Supervision of high risk pregnancy, unspecified, unspecified trimester: Secondary | ICD-10-CM

## 2020-12-01 NOTE — Progress Notes (Unsigned)
2:45p- Called Pt to start My Chart visit, no answer.left VM that I will call back in 10 to 15 minutes.   3:07p-2nd attempt, no answer. Left VM to re-schedule.

## 2020-12-02 ENCOUNTER — Ambulatory Visit: Payer: Medicaid Other

## 2020-12-02 ENCOUNTER — Other Ambulatory Visit: Payer: Medicaid Other

## 2020-12-02 ENCOUNTER — Encounter: Payer: Self-pay | Admitting: Obstetrics and Gynecology

## 2020-12-06 ENCOUNTER — Telehealth (INDEPENDENT_AMBULATORY_CARE_PROVIDER_SITE_OTHER): Payer: Medicaid Other | Admitting: Obstetrics and Gynecology

## 2020-12-06 DIAGNOSIS — Z86711 Personal history of pulmonary embolism: Secondary | ICD-10-CM

## 2020-12-06 DIAGNOSIS — Z3009 Encounter for other general counseling and advice on contraception: Secondary | ICD-10-CM

## 2020-12-06 DIAGNOSIS — O365930 Maternal care for other known or suspected poor fetal growth, third trimester, not applicable or unspecified: Secondary | ICD-10-CM

## 2020-12-06 DIAGNOSIS — O09293 Supervision of pregnancy with other poor reproductive or obstetric history, third trimester: Secondary | ICD-10-CM

## 2020-12-06 DIAGNOSIS — O0990 Supervision of high risk pregnancy, unspecified, unspecified trimester: Secondary | ICD-10-CM

## 2020-12-06 DIAGNOSIS — Z3A31 31 weeks gestation of pregnancy: Secondary | ICD-10-CM

## 2020-12-06 DIAGNOSIS — O365920 Maternal care for other known or suspected poor fetal growth, second trimester, not applicable or unspecified: Secondary | ICD-10-CM

## 2020-12-06 DIAGNOSIS — Z8759 Personal history of other complications of pregnancy, childbirth and the puerperium: Secondary | ICD-10-CM

## 2020-12-06 NOTE — Progress Notes (Signed)
Pt currently out of town, does not have access to her BP Cuff.

## 2020-12-06 NOTE — Progress Notes (Addendum)
   OBSTETRICS PRENATAL VIRTUAL VISIT ENCOUNTER NOTE  Provider location: Center for [facility]   I connected with Carmen [name] on 12/06/20 at  9:55 AM EST by MyChart Video Encounter at home and verified that I am speaking with the correct person using two identifiers.   I discussed the limitations, risks, security and privacy concerns of performing an evaluation and management service virtually and the availability of in person appointments. I also discussed with the patient that there may be a patient responsible charge related to this service. The patient expressed understanding and agreed to proceed. Subjective:  Carmen [name] is a 30 y.o. G2P1001 at [redacted]w[redacted]d being seen today for ongoing prenatal care.  She is currently monitored for the following issues for this high-risk pregnancy and has History of pulmonary embolus during previous pregnancy in 2019; Hyperemesis affecting pregnancy, antepartum; Ptyalism; Supervision of high risk pregnancy, antepartum; Alpha thalassemia silent carrier; Marijuana abuse; IUGR (intrauterine growth restriction) affecting care of mother; and Unwanted fertility on their problem list.  Patient reports hip pain.  Contractions: Irritability. Vag. Bleeding: None.  Movement: Present. Denies any leaking of fluid.   The following portions of the patient's history were reviewed and updated as appropriate: allergies, current medications, past family history, past medical history, past social history, past surgical history and problem list.   Objective:  There were no vitals filed for this visit.  Fetal Status:     Movement: Present     General:  Alert, oriented and cooperative. Patient is in no acute distress.  Respiratory: Normal respiratory effort, no problems with respiration noted  Mental Status: Normal mood and affect. Normal behavior. Normal judgment and thought content.  Rest of physical exam deferred due to type of  encounter  Imaging:   Assessment and Plan:  Pregnancy: G2P1001 at [redacted]w[redacted]d 1. Supervision of high risk pregnancy, antepartum Passed glucola  2. History of pulmonary embolus during previous pregnancy in 2019 Cont lovenox  3. Poor fetal growth affecting management of mother in second trimester, single or unspecified fetus Last growth 4.5%tile Normal dopplers Reviewed plan for induction at 37 weeks for IUGR, date may change based on how growth looks, she verbalizes understanding  4. Unwanted fertility For BTL  5. [redacted] weeks gestation of pregnancy   Preterm labor symptoms and general obstetric precautions including but not limited to vaginal bleeding, contractions, leaking of fluid and fetal movement were reviewed in detail with the patient. I discussed the assessment and treatment plan with the patient. The patient was provided an opportunity to ask questions and all were answered. The patient agreed with the plan and demonstrated an understanding of the instructions. The patient was advised to call back or seek an in-person office evaluation/go to MAU at [facility] for any urgent or concerning symptoms. Please refer to After Visit Summary for other counseling recommendations.   I provided 22 minutes of face-to-face time during this encounter.  Return in about 2 weeks (around 12/20/2020) for high OB, in person.  Future Appointments  Date Time Provider Department Center  12/08/2020  9:00 AM [facility] NURSE [facility]  12/08/2020  9:15 AM [facility] US2 [facility]    Conan Bowens, MD Center for [facility], [facility]

## 2020-12-08 ENCOUNTER — Encounter: Payer: Self-pay | Admitting: *Deleted

## 2020-12-08 ENCOUNTER — Ambulatory Visit: Payer: Medicaid Other | Attending: Maternal & Fetal Medicine

## 2020-12-08 ENCOUNTER — Other Ambulatory Visit: Payer: Self-pay

## 2020-12-08 ENCOUNTER — Ambulatory Visit: Payer: Medicaid Other | Admitting: *Deleted

## 2020-12-08 DIAGNOSIS — O0990 Supervision of high risk pregnancy, unspecified, unspecified trimester: Secondary | ICD-10-CM | POA: Insufficient documentation

## 2020-12-08 DIAGNOSIS — Z148 Genetic carrier of other disease: Secondary | ICD-10-CM | POA: Diagnosis not present

## 2020-12-08 DIAGNOSIS — O358XX0 Maternal care for other (suspected) fetal abnormality and damage, not applicable or unspecified: Secondary | ICD-10-CM | POA: Diagnosis not present

## 2020-12-08 DIAGNOSIS — O365930 Maternal care for other known or suspected poor fetal growth, third trimester, not applicable or unspecified: Secondary | ICD-10-CM | POA: Insufficient documentation

## 2020-12-08 DIAGNOSIS — O09293 Supervision of pregnancy with other poor reproductive or obstetric history, third trimester: Secondary | ICD-10-CM

## 2020-12-08 DIAGNOSIS — Z3A32 32 weeks gestation of pregnancy: Secondary | ICD-10-CM

## 2020-12-08 DIAGNOSIS — F1910 Other psychoactive substance abuse, uncomplicated: Secondary | ICD-10-CM

## 2020-12-08 DIAGNOSIS — O99323 Drug use complicating pregnancy, third trimester: Secondary | ICD-10-CM

## 2020-12-09 ENCOUNTER — Other Ambulatory Visit: Payer: Self-pay | Admitting: Maternal & Fetal Medicine

## 2020-12-09 DIAGNOSIS — F1920 Other psychoactive substance dependence, uncomplicated: Secondary | ICD-10-CM

## 2020-12-09 DIAGNOSIS — O365910 Maternal care for other known or suspected poor fetal growth, first trimester, not applicable or unspecified: Secondary | ICD-10-CM

## 2020-12-09 DIAGNOSIS — O359XX0 Maternal care for (suspected) fetal abnormality and damage, unspecified, not applicable or unspecified: Secondary | ICD-10-CM

## 2020-12-14 ENCOUNTER — Other Ambulatory Visit: Payer: Self-pay

## 2020-12-14 ENCOUNTER — Ambulatory Visit: Payer: Medicaid Other | Admitting: *Deleted

## 2020-12-14 ENCOUNTER — Encounter: Payer: Self-pay | Admitting: *Deleted

## 2020-12-14 ENCOUNTER — Ambulatory Visit: Payer: Medicaid Other | Attending: Maternal & Fetal Medicine

## 2020-12-14 DIAGNOSIS — O365910 Maternal care for other known or suspected poor fetal growth, first trimester, not applicable or unspecified: Secondary | ICD-10-CM | POA: Insufficient documentation

## 2020-12-14 DIAGNOSIS — O99320 Drug use complicating pregnancy, unspecified trimester: Secondary | ICD-10-CM | POA: Insufficient documentation

## 2020-12-14 DIAGNOSIS — F1920 Other psychoactive substance dependence, uncomplicated: Secondary | ICD-10-CM | POA: Diagnosis not present

## 2020-12-14 DIAGNOSIS — O0990 Supervision of high risk pregnancy, unspecified, unspecified trimester: Secondary | ICD-10-CM | POA: Diagnosis present

## 2020-12-14 DIAGNOSIS — O359XX0 Maternal care for (suspected) fetal abnormality and damage, unspecified, not applicable or unspecified: Secondary | ICD-10-CM | POA: Diagnosis not present

## 2020-12-20 ENCOUNTER — Encounter (HOSPITAL_COMMUNITY): Payer: Self-pay | Admitting: Obstetrics and Gynecology

## 2020-12-20 ENCOUNTER — Inpatient Hospital Stay (HOSPITAL_COMMUNITY)
Admission: AD | Admit: 2020-12-20 | Discharge: 2020-12-20 | Disposition: A | Discharge status: Home / Self Care | Payer: Medicaid Other | Attending: Obstetrics and Gynecology | Admitting: Obstetrics and Gynecology

## 2020-12-20 ENCOUNTER — Other Ambulatory Visit: Payer: Self-pay

## 2020-12-20 DIAGNOSIS — O479 False labor, unspecified: Secondary | ICD-10-CM

## 2020-12-20 DIAGNOSIS — Z3A33 33 weeks gestation of pregnancy: Secondary | ICD-10-CM | POA: Insufficient documentation

## 2020-12-20 DIAGNOSIS — O4703 False labor before 37 completed weeks of gestation, third trimester: Secondary | ICD-10-CM | POA: Insufficient documentation

## 2020-12-20 DIAGNOSIS — Z87891 Personal history of nicotine dependence: Secondary | ICD-10-CM | POA: Insufficient documentation

## 2020-12-20 LAB — URINALYSIS, ROUTINE W REFLEX MICROSCOPIC
Bacteria, UA: NONE SEEN
Bilirubin Urine: NEGATIVE
Glucose, UA: NEGATIVE mg/dL
Ketones, ur: NEGATIVE mg/dL
Leukocytes,Ua: NEGATIVE
Nitrite: NEGATIVE
Protein, ur: NEGATIVE mg/dL
RBC / HPF: >50 RBC/hpf — ABNORMAL HIGH (ref 0–5)
Specific Gravity, Urine: 1.02 (ref 1.005–1.030)
pH: 8 (ref 5.0–8.0)

## 2020-12-20 MED ORDER — LACTATED RINGERS IV BOLUS
1000.0000 mL | Freq: Once | INTRAVENOUS | Status: AC
  Administered 2020-12-20: 1000 mL via INTRAVENOUS

## 2020-12-20 MED ORDER — TERBUTALINE SULFATE 1 MG/ML IJ SOLN
0.2500 mg | Freq: Once | INTRAMUSCULAR | Status: AC
  Administered 2020-12-20: 0.25 mg via SUBCUTANEOUS
  Filled 2020-12-20: qty 1

## 2020-12-20 NOTE — MAU Provider Note (Signed)
History   [id number]  Arrival date and time: 12/20/20 1421  Chief Complaint  Patient presents with  . Contractions  . Abdominal Pain    HPI Linzie Criss is a 30 y.o. at [redacted]w[redacted]d by early ultrasound who presents for abdominal cramping and contractions. Prenatal course significant for PE during prior pregnancy (currently on Lovenox), hyperemesis, IUGR, and alpha-thal carrier. Patient reports abdominal cramping, which she describes as "feeling like her cycle is about to start". Has been going on for ~2 weeks intermittently, but today much more painful, more frequent, and persistent. She has a hx of one prior full term vaginal delivery and states this "does kind of feel like labor". She timed the frequency of these contractions about 3 days ago and felt them q2-3 mins. Has not had any water today because it often makes her feel nauseous, but has had sips of Coke. No recent intercourse. Also notes pink tinge on toilet paper after urinating today. Vaginal bleeding: No LOF: No Fetal Movement: Yes Contractions: Yes   Review of last MAU admission on 08/25/2020: discharged home after supportive care for hyperemesis  Review of records from Care Everywhere: no pertinent records  O/Positive/-- (10/07 1458)  OB History    Gravida  2   Para  1   Term  1   Preterm  0   AB  0   Living  1     SAB  0   IAB  0   Ectopic  0   Multiple  0   Live Births  1           Past Medical History:  Diagnosis Date  . Anemia   . Asthma   . Panic attacks   . Pulmonary embolism, antepartum 02/08/2018   Indications: PE diagnosed 5/2@ [facility].  Left side, lower lobe;   Admitted 5/2-5/6 - heparin drip>lovenox 60BID.  ED evaluation 5/8 2/2 persistent SOB - negative DVT, nml ECG Anticoagulation goal is therapeutic  Therapeutic dose is Lovenox 1mg /kg SQ Q12--currently on 60 mg BID  Monitoring is indicated for patients on therapeutic anticoagulation  Will check Anti-Xa level 4-6 hours after 3rd dose Q trime     Past Surgical History:  Procedure Laterality Date  . NO PAST SURGERIES      Family History  Problem Relation Age of Onset  . Pulmonary embolism Mother   . Ulcers Sister     Social History   Socioeconomic History  . Marital status: Single    Spouse name: Not on file  . Number of children: Not on file  . Years of education: Not on file  . Highest education level: Not on file  Occupational History  . Not on file  Tobacco Use  . Smoking status: Former Smoker    Types: Cigarettes  . Smokeless tobacco: Never Used  Vaping Use  . Vaping Use: Never used  Substance and Sexual Activity  . Alcohol use: Not Currently  . Drug use: No  . Sexual activity: Yes  Other Topics Concern  . Not on file  Social History Narrative  . Not on file   Social Determinants of Health   Financial Resource Strain: Not on file  Food Insecurity: No Food Insecurity  . Worried About in the Last Year: Never true  . Ran Out of Food in the Last Year: Never true  Transportation Needs: No Transportation Needs  . Lack of Transportation (Medical): No  . Lack of Transportation (Non-Medical): No  Physical  Activity: Not on file  Stress: Not on file  Social Connections: Not on file  Intimate Partner Violence: Not on file    Allergies  Allergen Reactions  . Morphine And Related Anaphylaxis, Swelling and Other (See Comments)    "swells shut" the patient's throat    No current facility-administered medications on file prior to encounter.   Current Outpatient Medications on File Prior to Encounter  Medication Sig Dispense Refill  . enoxaparin (LOVENOX) 40 MG/0.4ML injection Inject 0.4 mLs (40 mg total) into the skin daily. 30 mL 4  . Prenatal Vit-Fe Fumarate-FA (PRENATAL PO) Take 1 tablet by mouth daily.    Marland Kitchen albuterol (VENTOLIN HFA) 108 (90 Base) MCG/ACT inhaler Inhale 1-2 puffs into the lungs every 6 (six) hours as needed for wheezing or shortness of breath. 18 g 2  . ferrous  sulfate 325 (65 FE) MG tablet Take 1 tablet (325 mg total) by mouth daily with breakfast. 30 tablet 3     ROS Pertinent positives and negative per HPI, all others reviewed and negative  Physical Exam   BP 105/64   Pulse 95   Temp 98.3 F (36.8 C) (Oral)   Resp 18   Ht 4\' 11"  (1.499 m)   Wt 68.7 kg   LMP 04/19/2020 (Approximate)   SpO2 100%   BMI 30.58 kg/m   Physical Exam Gen: alert, uncomfortable appearing Resp: breathing comfortably  Cervical Exam Dilation: Closed Effacement (%): Thick Cervical Position: Posterior Exam by:: [email or web address] NP  FHT Baseline 140, moderate variability, + accels, no decels Toco: irregular Cat: I   Labs Results for orders placed or performed during the hospital encounter of 12/20/20 (from the past 24 hour(s))  Urinalysis, Routine w reflex microscopic Urine, Clean Catch     Status: Abnormal   Collection Time: 12/20/20  2:54 PM  Result Value Ref Range   Color, Urine YELLOW YELLOW   APPearance CLEAR CLEAR   Specific Gravity, Urine 1.020 1.005 - 1.030   pH 8.0 5.0 - 8.0   Glucose, UA NEGATIVE NEGATIVE mg/dL   Hgb urine dipstick MODERATE (A) NEGATIVE   Bilirubin Urine NEGATIVE NEGATIVE   Ketones, ur NEGATIVE NEGATIVE mg/dL   Protein, ur NEGATIVE NEGATIVE mg/dL   Nitrite NEGATIVE NEGATIVE   Leukocytes,Ua NEGATIVE NEGATIVE   RBC / HPF >50 (H) 0 - 5 RBC/hpf   WBC, UA 0-5 0 - 5 WBC/hpf   Bacteria, UA NONE SEEN NONE SEEN   Squamous Epithelial / LPF 0-5 0 - 5   Mucus PRESENT     Imaging No results found.  MAU Course  Procedures  Lab Orders     Urinalysis, Routine w reflex microscopic Urine, Clean Catch Meds ordered this encounter  Medications  . lactated ringers bolus 1,000 mL  . terbutaline (BRETHINE) injection 0.25 mg  . lactated ringers bolus 1,000 mL   Imaging Orders  No imaging studies ordered today    Assessment and Plan  Nancyjo Givhan is a G2P1001 at 30y.o at [redacted]w[redacted]d presenting with abdominal cramping and  contractions.  Cervical exam closed/thick Cat I strip Improved slightly after 1L LR fluid bolus, but still with ctx q5 mins per patient Given terb x1 and additional LR bolus with further improvement Discharge home, routine OB follow-up Return precautions discussed   [redacted]w[redacted]d

## 2020-12-20 NOTE — [facility] Provider Note (Signed)
History     CSN: [id number]  Arrival date and time: 12/20/20 1421   Event Date/Time   First Provider Initiated Contact with Patient 12/20/20 1539      Chief Complaint  Patient presents with  . Contractions  . Abdominal Pain   HPI Carmen [name] is a 30 y.o. G2P1001 at [redacted]w[redacted]d who presents with cramping & contractions. Symptoms have been going on for the last week but worsened today. Reports intermittent abdominal pain & tightening that occurs more than 10 times per hour. Rates pain 9/10. Hasn't treated symptoms. Nothing makes better or worse. Saw some mucoid discharge with a pink streak in it. Denies dysuria, n/v/d, vaginal bleeding, or LOF. Reports good fetal movement. No recent intercourse.   OB History    Gravida  2   Para  1   Term  1   Preterm  0   AB  0   Living  1     SAB  0   IAB  0   Ectopic  0   Multiple  0   Live Births  1           Past Medical History:  Diagnosis Date  . Anemia   . Asthma   . Panic attacks   . Pulmonary embolism, antepartum 02/08/2018   Indications: PE diagnosed 5/2@ [facility].  Left side, lower lobe;   Admitted 5/2-5/6 - heparin drip>lovenox 60BID.  ED evaluation 5/8 2/2 persistent SOB - negative DVT, nml ECG Anticoagulation goal is therapeutic  Therapeutic dose is Lovenox 1mg /kg SQ Q12--currently on 60 mg BID  Monitoring is indicated for patients on therapeutic anticoagulation  Will check Anti-Xa level 4-6 hours after 3rd dose Q trime    Past Surgical History:  Procedure Laterality Date  . NO PAST SURGERIES      Family History  Problem Relation Age of Onset  . Pulmonary embolism Mother   . Ulcers Sister     Social History   Tobacco Use  . Smoking status: Former Smoker    Types: Cigarettes  . Smokeless tobacco: Never Used  Vaping Use  . Vaping Use: Never used  Substance Use Topics  . Alcohol use: Not Currently  . Drug use: No    Allergies:  Allergies  Allergen Reactions  . Morphine And Related Anaphylaxis,  Swelling and Other (See Comments)    "swells shut" the patient's throat    Medications Prior to Admission  Medication Sig Dispense Refill Last Dose  . enoxaparin (LOVENOX) 40 MG/0.4ML injection Inject 0.4 mLs (40 mg total) into the skin daily. 30 mL 4 Past Week at Unknown time  . Prenatal Vit-Fe Fumarate-FA (PRENATAL PO) Take 1 tablet by mouth daily.   Past Week at Unknown time  . albuterol (VENTOLIN HFA) 108 (90 Base) MCG/ACT inhaler Inhale 1-2 puffs into the lungs every 6 (six) hours as needed for wheezing or shortness of breath. 18 g 2 Unknown at Unknown time  . ferrous sulfate 325 (65 FE) MG tablet Take 1 tablet (325 mg total) by mouth daily with breakfast. 30 tablet 3 Unknown at Unknown time    Review of Systems  Constitutional: Negative.   Gastrointestinal: Positive for abdominal pain. Negative for diarrhea, nausea and vomiting.  Genitourinary: Negative.    Physical Exam   Blood pressure 105/64, pulse 95, temperature 98.3 F (36.8 C), temperature source Oral, resp. rate 18, height 4\' 11"  (1.499 m), weight 68.7 kg, last menstrual period 04/19/2020, SpO2 100 %, unknown if currently breastfeeding.  Physical  Exam Vitals and nursing note reviewed. Exam conducted with a chaperone present.  Constitutional:      General: She is not in acute distress.    Appearance: She is well-developed.  Pulmonary:     Effort: Pulmonary effort is normal. No respiratory distress.  Abdominal:     Tenderness: There is no abdominal tenderness.     Comments: Ctx palpate mild  Genitourinary:    Comments: Dilation: Closed Effacement (%): Thick Cervical Position: Posterior Exam by:: Judeth Horn NP  Skin:    General: Skin is warm and dry.  Neurological:     Mental Status: She is alert.  Psychiatric:        Mood and Affect: Mood normal.        Behavior: Behavior normal.    NST:  Baseline: 135 bpm, Variability: Good {> 6 bpm), Accelerations: Reactive, Decelerations: Absent and irregular ctx &  UI  MAU Course  Procedures Results for orders placed or performed during the hospital encounter of 12/20/20 (from the past 24 hour(s))  Urinalysis, Routine w reflex microscopic Urine, Clean Catch     Status: Abnormal   Collection Time: 12/20/20  2:54 PM  Result Value Ref Range   Color, Urine YELLOW YELLOW   APPearance CLEAR CLEAR   Specific Gravity, Urine 1.020 1.005 - 1.030   pH 8.0 5.0 - 8.0   Glucose, UA NEGATIVE NEGATIVE mg/dL   Hgb urine dipstick MODERATE (A) NEGATIVE   Bilirubin Urine NEGATIVE NEGATIVE   Ketones, ur NEGATIVE NEGATIVE mg/dL   Protein, ur NEGATIVE NEGATIVE mg/dL   Nitrite NEGATIVE NEGATIVE   Leukocytes,Ua NEGATIVE NEGATIVE   RBC / HPF >50 (H) 0 - 5 RBC/hpf   WBC, UA 0-5 0 - 5 WBC/hpf   Bacteria, UA NONE SEEN NONE SEEN   Squamous Epithelial / LPF 0-5 0 - 5   Mucus PRESENT     MDM Irregular contractions on the monitor that palpate mild. Patient reports no water intake today due to HEG. Will give IV fluids. Cervix closed/thick.  Reports continued painful contractions after 1 liter of LR (although observed sleeping by the nurse when reassessed). Terbutaline given & 2nd liter of LR hung.  Patient reports decrease in ctx frequency after IV fluids & terbutaline and does not want her cervix to be rechecked.   Assessment and Plan   1. Braxton Hicks contractions   2. [redacted] weeks gestation of pregnancy    -reviewed reasons to return to [facility]  Judeth Horn 12/20/2020, 6:43 PM

## 2020-12-20 NOTE — MAU Note (Signed)
For the past wk, has been feeling like cycle was coming on. The other night she was timing the 'braxton hicks', they were 2-3 min apart, really intense.  This morning was still feeling like cycle was going to start,  Sharp pains in lower abd, when she wiped there was mucous, noted a pink tint on tissue.

## 2020-12-20 NOTE — Discharge Instructions (Signed)
[employer]'s Emergency Medicine: Concepts and Clinical Practice (9th ed., pp. 2296- 2312). Elsevier.">  Braxton Hicks Contractions Contractions of the uterus can occur throughout pregnancy, but they are not always a sign that you are in labor. You may have practice contractions called Braxton Hicks contractions. These false labor contractions are sometimes confused with true labor. What are Braxton Hicks contractions? Braxton Hicks contractions are tightening movements that occur in the muscles of the uterus before labor. Unlike true labor contractions, these contractions do not result in opening (dilation) and thinning of the cervix. Toward the end of pregnancy (32-34 weeks), Braxton Hicks contractions can happen more often and may become stronger. These contractions are sometimes difficult to tell apart from true labor because they can be very uncomfortable. You should not feel embarrassed if you go to the hospital with false labor. Sometimes, the only way to tell if you are in true labor is for your health care provider to look for changes in the cervix. The health care provider will do a physical exam and may monitor your contractions. If you are not in true labor, the exam should show that your cervix is not dilating and your water has not broken. If there are no other health problems associated with your pregnancy, it is completely safe for you to be sent home with false labor. You may continue to have Braxton Hicks contractions until you go into true labor. How to tell the difference between true labor and false labor True labor  Contractions last 30-70 seconds.  Contractions become very regular.  Discomfort is usually felt in the top of the uterus, and it spreads to the lower abdomen and low back.  Contractions do not go away with walking.  Contractions usually become more intense and increase in frequency.  The cervix dilates and gets thinner. False labor  Contractions are usually shorter  and not as strong as true labor contractions.  Contractions are usually irregular.  Contractions are often felt in the front of the lower abdomen and in the groin.  Contractions may go away when you walk around or change positions while lying down.  Contractions get weaker and are shorter-lasting as time goes on.  The cervix usually does not dilate or become thin. Follow these instructions at home:  Take over-the-counter and prescription medicines only as told by your health care provider.  Keep up with your usual exercises and follow other instructions from your health care provider.  Eat and drink lightly if you think you are going into labor.  If Braxton Hicks contractions are making you uncomfortable: ? Change your position from lying down or resting to walking, or change from walking to resting. ? Sit and rest in a tub of warm water. ? Drink enough fluid to keep your urine pale yellow. Dehydration may cause these contractions. ? Do slow and deep breathing several times an hour.  Keep all follow-up prenatal visits as told by your health care provider. This is important.   Contact a health care provider if:  You have a fever.  You have continuous pain in your abdomen. Get help right away if:  Your contractions become stronger, more regular, and closer together.  You have fluid leaking or gushing from your vagina.  You pass blood-tinged mucus (bloody show).  You have bleeding from your vagina.  You have low back pain that you never had before.  You feel your baby's head pushing down and causing pelvic pressure.  Your baby is not moving inside   you as much as it used to. Summary  Contractions that occur before labor are called Braxton Hicks contractions, false labor, or practice contractions.  Braxton Hicks contractions are usually shorter, weaker, farther apart, and less regular than true labor contractions. True labor contractions usually become progressively  stronger and regular, and they become more frequent.  Manage discomfort from Braxton Hicks contractions by changing position, resting in a warm bath, drinking plenty of water, or practicing deep breathing. This information is not intended to replace advice given to you by your health care provider. Make sure you discuss any questions you have with your health care provider. Document Revised: 09/07/2017 Document Reviewed: 02/08/2017 Elsevier Patient Education  2021 [employer]   Fetal Movement Counts Patient Name: ________________________________________________ Patient Due Date: ____________________  What is a fetal movement count? A fetal movement count is the number of times that you feel your baby move during a certain amount of time. This may also be called a fetal kick count. A fetal movement count is recommended for every pregnant woman. You may be asked to start counting fetal movements as early as week 28 of your pregnancy. Pay attention to when your baby is most active. You may notice your baby's sleep and wake cycles. You may also notice things that make your baby move more. You should do a fetal movement count:  When your baby is normally most active.  At the same time each day. A good time to count movements is while you are resting, after having something to eat and drink. How do I count fetal movements? 1. Find a quiet, comfortable area. Sit, or lie down on your side. 2. Write down the date, the start time and stop time, and the number of movements that you felt between those two times. Take this information with you to your health care visits. 3. Write down your start time when you feel the first movement. 4. Count kicks, flutters, swishes, rolls, and jabs. You should feel at least 10 movements. 5. You may stop counting after you have felt 10 movements, or if you have been counting for 2 hours. Write down the stop time. 6. If you do not feel 10 movements in 2 hours, contact  your health care provider for further instructions. Your health care provider may want to do additional tests to assess your baby's well-being. Contact a health care provider if:  You feel fewer than 10 movements in 2 hours.  Your baby is not moving like he or she usually does. Date: ____________ Start time: ____________ Stop time: ____________ Movements: ____________ Date: ____________ Start time: ____________ Stop time: ____________ Movements: ____________ Date: ____________ Start time: ____________ Stop time: ____________ Movements: ____________ Date: ____________ Start time: ____________ Stop time: ____________ Movements: ____________ Date: ____________ Start time: ____________ Stop time: ____________ Movements: ____________ Date: ____________ Start time: ____________ Stop time: ____________ Movements: ____________ Date: ____________ Start time: ____________ Stop time: ____________ Movements: ____________ Date: ____________ Start time: ____________ Stop time: ____________ Movements: ____________ Date: ____________ Start time: ____________ Stop time: ____________ Movements: ____________ This information is not intended to replace advice given to you by your health care provider. Make sure you discuss any questions you have with your health care provider. Document Revised: 05/15/2019 Document Reviewed: 05/15/2019 Elsevier Patient Education  2021 [employer]  

## 2020-12-22 ENCOUNTER — Encounter: Payer: Self-pay | Admitting: Family Medicine

## 2020-12-22 ENCOUNTER — Other Ambulatory Visit: Payer: Self-pay

## 2020-12-22 ENCOUNTER — Ambulatory Visit (INDEPENDENT_AMBULATORY_CARE_PROVIDER_SITE_OTHER): Payer: Medicaid Other | Admitting: Family Medicine

## 2020-12-22 VITALS — BP 102/76 | HR 106 | Wt 154.0 lb

## 2020-12-22 DIAGNOSIS — Z86711 Personal history of pulmonary embolism: Secondary | ICD-10-CM

## 2020-12-22 DIAGNOSIS — O0990 Supervision of high risk pregnancy, unspecified, unspecified trimester: Secondary | ICD-10-CM

## 2020-12-22 DIAGNOSIS — Z3009 Encounter for other general counseling and advice on contraception: Secondary | ICD-10-CM

## 2020-12-22 DIAGNOSIS — O365930 Maternal care for other known or suspected poor fetal growth, third trimester, not applicable or unspecified: Secondary | ICD-10-CM

## 2020-12-22 DIAGNOSIS — Z8759 Personal history of other complications of pregnancy, childbirth and the puerperium: Secondary | ICD-10-CM

## 2020-12-22 NOTE — Patient Instructions (Signed)
 Contraception Choices Contraception, also called birth control, refers to methods or devices that prevent pregnancy. Hormonal methods Contraceptive implant A contraceptive implant is a thin, plastic tube that contains a hormone that prevents pregnancy. It is different from an intrauterine device (IUD). It is inserted into the upper part of the arm by a health care provider. Implants can be effective for up to 3 years. Progestin-only injections Progestin-only injections are injections of progestin, a synthetic form of the hormone progesterone. They are given every 3 months by a health care provider. Birth control pills Birth control pills are pills that contain hormones that prevent pregnancy. They must be taken once a day, preferably at the same time each day. A prescription is needed to use this method of contraception. Birth control patch The birth control patch contains hormones that prevent pregnancy. It is placed on the skin and must be changed once a week for three weeks and removed on the fourth week. A prescription is needed to use this method of contraception. Vaginal ring A vaginal ring contains hormones that prevent pregnancy. It is placed in the vagina for three weeks and removed on the fourth week. After that, the process is repeated with a new ring. A prescription is needed to use this method of contraception. Emergency contraceptive Emergency contraceptives prevent pregnancy after unprotected sex. They come in pill form and can be taken up to 5 days after sex. They work best the sooner they are taken after having sex. Most emergency contraceptives are available without a prescription. This method should not be used as your only form of birth control.   Barrier methods Female condom A female condom is a thin sheath that is worn over the penis during sex. Condoms keep sperm from going inside a woman's body. They can be used with a sperm-killing substance (spermicide) to increase their  effectiveness. They should be thrown away after one use. Female condom A female condom is a soft, loose-fitting sheath that is put into the vagina before sex. The condom keeps sperm from going inside a woman's body. They should be thrown away after one use. Diaphragm A diaphragm is a soft, dome-shaped barrier. It is inserted into the vagina before sex, along with a spermicide. The diaphragm blocks sperm from entering the uterus, and the spermicide kills sperm. A diaphragm should be left in the vagina for 6-8 hours after sex and removed within 24 hours. A diaphragm is prescribed and fitted by a health care provider. A diaphragm should be replaced every 1-2 years, after giving birth, after gaining more than 15 lb (6.8 kg), and after pelvic surgery. Cervical cap A cervical cap is a round, soft latex or plastic cup that fits over the cervix. It is inserted into the vagina before sex, along with spermicide. It blocks sperm from entering the uterus. The cap should be left in place for 6-8 hours after sex and removed within 48 hours. A cervical cap must be prescribed and fitted by a health care provider. It should be replaced every 2 years. Sponge A sponge is a soft, circular piece of polyurethane foam with spermicide in it. The sponge helps block sperm from entering the uterus, and the spermicide kills sperm. To use it, you make it wet and then insert it into the vagina. It should be inserted before sex, left in for at least 6 hours after sex, and removed and thrown away within 30 hours. Spermicides Spermicides are chemicals that kill or block sperm from entering the   cervix and uterus. They can come as a cream, jelly, suppository, foam, or tablet. A spermicide should be inserted into the vagina with an applicator at least 10-15 minutes before sex to allow time for it to work. The process must be repeated every time you have sex. Spermicides do not require a prescription.   Intrauterine  contraception Intrauterine device (IUD) An IUD is a T-shaped device that is put in a woman's uterus. There are two types:  Hormone IUD.This type contains progestin, a synthetic form of the hormone progesterone. This type can stay in place for 3-5 years.  Copper IUD.This type is wrapped in copper wire. It can stay in place for 10 years. Permanent methods of contraception Female tubal ligation In this method, a woman's fallopian tubes are sealed, tied, or blocked during surgery to prevent eggs from traveling to the uterus. Hysteroscopic sterilization In this method, a small, flexible insert is placed into each fallopian tube. The inserts cause scar tissue to form in the fallopian tubes and block them, so sperm cannot reach an egg. The procedure takes about 3 months to be effective. Another form of birth control must be used during those 3 months. Female sterilization This is a procedure to tie off the tubes that carry sperm (vasectomy). After the procedure, the man can still ejaculate fluid (semen). Another form of birth control must be used for 3 months after the procedure. Natural planning methods Natural family planning In this method, a couple does not have sex on days when the woman could become pregnant. Calendar method In this method, the woman keeps track of the length of each menstrual cycle, identifies the days when pregnancy can happen, and does not have sex on those days. Ovulation method In this method, a couple avoids sex during ovulation. Symptothermal method This method involves not having sex during ovulation. The woman typically checks for ovulation by watching changes in her temperature and in the consistency of cervical mucus. Post-ovulation method In this method, a couple waits to have sex until after ovulation. Where to find more information  Centers for Disease Control and Prevention: www.cdc.gov Summary  Contraception, also called birth control, refers to methods or  devices that prevent pregnancy.  Hormonal methods of contraception include implants, injections, pills, patches, vaginal rings, and emergency contraceptives.  Barrier methods of contraception can include female condoms, female condoms, diaphragms, cervical caps, sponges, and spermicides.  There are two types of IUDs (intrauterine devices). An IUD can be put in a woman's uterus to prevent pregnancy for 3-5 years.  Permanent sterilization can be done through a procedure for males and females. Natural family planning methods involve nothaving sex on days when the woman could become pregnant. This information is not intended to replace advice given to you by your health care provider. Make sure you discuss any questions you have with your health care provider. Document Revised: 03/01/2020 Document Reviewed: 03/01/2020 Elsevier Patient Education  2021 [employer] Inc.   Breastfeeding  Choosing to breastfeed is one of the best decisions you can make for yourself and your baby. A change in hormones during pregnancy causes your breasts to make breast milk in your milk-producing glands. Hormones prevent breast milk from being released before your baby is born. They also prompt milk flow after birth. Once breastfeeding has begun, thoughts of your baby, as well as his or her sucking or crying, can stimulate the release of milk from your milk-producing glands. Benefits of breastfeeding Research shows that breastfeeding offers many health benefits   for infants and mothers. It also offers a cost-free and convenient way to feed your baby. For your baby  Your first milk (colostrum) helps your baby's digestive system to function better.  Special cells in your milk (antibodies) help your baby to fight off infections.  Breastfed babies are less likely to develop asthma, allergies, obesity, or type 2 diabetes. They are also at lower risk for sudden infant death syndrome (SIDS).  Nutrients in breast milk are better  able to meet your baby's needs compared to infant formula.  Breast milk improves your baby's brain development. For you  Breastfeeding helps to create a very special bond between you and your baby.  Breastfeeding is convenient. Breast milk costs nothing and is always available at the correct temperature.  Breastfeeding helps to burn calories. It helps you to lose the weight that you gained during pregnancy.  Breastfeeding makes your uterus return faster to its size before pregnancy. It also slows bleeding (lochia) after you give birth.  Breastfeeding helps to lower your risk of developing type 2 diabetes, osteoporosis, rheumatoid arthritis, cardiovascular disease, and breast, ovarian, uterine, and endometrial cancer later in life. Breastfeeding basics Starting breastfeeding  Find a comfortable place to sit or lie down, with your neck and back well-supported.  Place a pillow or a rolled-up blanket under your baby to bring him or her to the level of your breast (if you are seated). Nursing pillows are specially designed to help support your arms and your baby while you breastfeed.  Make sure that your baby's tummy (abdomen) is facing your abdomen.  Gently massage your breast. With your fingertips, massage from the outer edges of your breast inward toward the nipple. This encourages milk flow. If your milk flows slowly, you may need to continue this action during the feeding.  Support your breast with 4 fingers underneath and your thumb above your nipple (make the letter "C" with your hand). Make sure your fingers are well away from your nipple and your baby's mouth.  Stroke your baby's lips gently with your finger or nipple.  When your baby's mouth is open wide enough, quickly bring your baby to your breast, placing your entire nipple and as much of the areola as possible into your baby's mouth. The areola is the colored area around your nipple. ? More areola should be visible above your  baby's upper lip than below the lower lip. ? Your baby's lips should be opened and extended outward (flanged) to ensure an adequate, comfortable latch. ? Your baby's tongue should be between his or her lower gum and your breast.  Make sure that your baby's mouth is correctly positioned around your nipple (latched). Your baby's lips should create a seal on your breast and be turned out (everted).  It is common for your baby to suck about 2-3 minutes in order to start the flow of breast milk. Latching Teaching your baby how to latch onto your breast properly is very important. An improper latch can cause nipple pain, decreased milk supply, and poor weight gain in your baby. Also, if your baby is not latched onto your nipple properly, he or she may swallow some air during feeding. This can make your baby fussy. Burping your baby when you switch breasts during the feeding can help to get rid of the air. However, teaching your baby to latch on properly is still the best way to prevent fussiness from swallowing air while breastfeeding. Signs that your baby has successfully latched onto   your nipple  Silent tugging or silent sucking, without causing you pain. Infant's lips should be extended outward (flanged).  Swallowing heard between every 3-4 sucks once your milk has started to flow (after your let-down milk reflex occurs).  Muscle movement above and in front of his or her ears while sucking. Signs that your baby has not successfully latched onto your nipple  Sucking sounds or smacking sounds from your baby while breastfeeding.  Nipple pain. If you think your baby has not latched on correctly, slip your finger into the corner of your baby's mouth to break the suction and place it between your baby's gums. Attempt to start breastfeeding again. Signs of successful breastfeeding Signs from your baby  Your baby will gradually decrease the number of sucks or will completely stop sucking.  Your baby  will fall asleep.  Your baby's body will relax.  Your baby will retain a small amount of milk in his or her mouth.  Your baby will let go of your breast by himself or herself. Signs from you  Breasts that have increased in firmness, weight, and size 1-3 hours after feeding.  Breasts that are softer immediately after breastfeeding.  Increased milk volume, as well as a change in milk consistency and color by the fifth day of breastfeeding.  Nipples that are not sore, cracked, or bleeding. Signs that your baby is getting enough milk  Wetting at least 1-2 diapers during the first 24 hours after birth.  Wetting at least 5-6 diapers every 24 hours for the first week after birth. The urine should be clear or pale yellow by the age of 5 days.  Wetting 6-8 diapers every 24 hours as your baby continues to grow and develop.  At least 3 stools in a 24-hour period by the age of 5 days. The stool should be soft and yellow.  At least 3 stools in a 24-hour period by the age of 7 days. The stool should be seedy and yellow.  No loss of weight greater than 10% of birth weight during the first 3 days of life.  Average weight gain of 4-7 oz (113-198 g) per week after the age of 4 days.  Consistent daily weight gain by the age of 5 days, without weight loss after the age of 2 weeks. After a feeding, your baby may spit up a small amount of milk. This is normal. Breastfeeding frequency and duration Frequent feeding will help you make more milk and can prevent sore nipples and extremely full breasts (breast engorgement). Breastfeed when you feel the need to reduce the fullness of your breasts or when your baby shows signs of hunger. This is called "breastfeeding on demand." Signs that your baby is hungry include:  Increased alertness, activity, or restlessness.  Movement of the head from side to side.  Opening of the mouth when the corner of the mouth or cheek is stroked (rooting).  Increased  sucking sounds, smacking lips, cooing, sighing, or squeaking.  Hand-to-mouth movements and sucking on fingers or hands.  Fussing or crying. Avoid introducing a pacifier to your baby in the first 4-6 weeks after your baby is born. After this time, you may choose to use a pacifier. Research has shown that pacifier use during the first year of a baby's life decreases the risk of sudden infant death syndrome (SIDS). Allow your baby to feed on each breast as long as he or she wants. When your baby unlatches or falls asleep while feeding from the   first breast, offer the second breast. Because newborns are often sleepy in the first few weeks of life, you may need to awaken your baby to get him or her to feed. Breastfeeding times will vary from baby to baby. However, the following rules can serve as a guide to help you make sure that your baby is properly fed:  Newborns (babies 4 weeks of age or younger) may breastfeed every 1-3 hours.  Newborns should not go without breastfeeding for longer than 3 hours during the day or 5 hours during the night.  You should breastfeed your baby a minimum of 8 times in a 24-hour period. Breast milk pumping Pumping and storing breast milk allows you to make sure that your baby is exclusively fed your breast milk, even at times when you are unable to breastfeed. This is especially important if you go back to work while you are still breastfeeding, or if you are not able to be present during feedings. Your lactation consultant can help you find a method of pumping that works best for you and give you guidelines about how long it is safe to store breast milk.      Caring for your breasts while you breastfeed Nipples can become dry, cracked, and sore while breastfeeding. The following recommendations can help keep your breasts moisturized and healthy:  Avoid using soap on your nipples.  Wear a supportive bra designed especially for nursing. Avoid wearing underwire-style  bras or extremely tight bras (sports bras).  Air-dry your nipples for 3-4 minutes after each feeding.  Use only cotton bra pads to absorb leaked breast milk. Leaking of breast milk between feedings is normal.  Use lanolin on your nipples after breastfeeding. Lanolin helps to maintain your skin's normal moisture barrier. Pure lanolin is not harmful (not toxic) to your baby. You may also hand express a few drops of breast milk and gently massage that milk into your nipples and allow the milk to air-dry. In the first few weeks after giving birth, some women experience breast engorgement. Engorgement can make your breasts feel heavy, warm, and tender to the touch. Engorgement peaks within 3-5 days after you give birth. The following recommendations can help to ease engorgement:  Completely empty your breasts while breastfeeding or pumping. You may want to start by applying warm, moist heat (in the shower or with warm, water-soaked hand towels) just before feeding or pumping. This increases circulation and helps the milk flow. If your baby does not completely empty your breasts while breastfeeding, pump any extra milk after he or she is finished.  Apply ice packs to your breasts immediately after breastfeeding or pumping, unless this is too uncomfortable for you. To do this: ? Put ice in a plastic bag. ? Place a towel between your skin and the bag. ? Leave the ice on for 20 minutes, 2-3 times a day.  Make sure that your baby is latched on and positioned properly while breastfeeding. If engorgement persists after 48 hours of following these recommendations, contact your health care provider or a lactation consultant. Overall health care recommendations while breastfeeding  Eat 3 healthy meals and 3 snacks every day. Well-nourished mothers who are breastfeeding need an additional 450-500 calories a day. You can meet this requirement by increasing the amount of a balanced diet that you eat.  Drink  enough water to keep your urine pale yellow or clear.  Rest often, relax, and continue to take your prenatal vitamins to prevent fatigue, stress, and low   vitamin and mineral levels in your body (nutrient deficiencies).  Do not use any products that contain nicotine or tobacco, such as cigarettes and e-cigarettes. Your baby may be harmed by chemicals from cigarettes that pass into breast milk and exposure to secondhand smoke. If you need help quitting, ask your health care provider.  Avoid alcohol.  Do not use illegal drugs or marijuana.  Talk with your health care provider before taking any medicines. These include over-the-counter and prescription medicines as well as vitamins and herbal supplements. Some medicines that may be harmful to your baby can pass through breast milk.  It is possible to become pregnant while breastfeeding. If birth control is desired, ask your health care provider about options that will be safe while breastfeeding your baby. Where to find more information: [employer]: www.llli.org Contact a health care provider if:  You feel like you want to stop breastfeeding or have become frustrated with breastfeeding.  Your nipples are cracked or bleeding.  Your breasts are red, tender, or warm.  You have: ? Painful breasts or nipples. ? A swollen area on either breast. ? A fever or chills. ? Nausea or vomiting. ? Drainage other than breast milk from your nipples.  Your breasts do not become full before feedings by the fifth day after you give birth.  You feel sad and depressed.  Your baby is: ? Too sleepy to eat well. ? Having trouble sleeping. ? More than 1 week old and wetting fewer than 6 diapers in a 24-hour period. ? Not gaining weight by 5 days of age.  Your baby has fewer than 3 stools in a 24-hour period.  Your baby's skin or the white parts of his or her eyes become yellow. Get help right away if:  Your baby is overly tired  (lethargic) and does not want to wake up and feed.  Your baby develops an unexplained fever. Summary  Breastfeeding offers many health benefits for infant and mothers.  Try to breastfeed your infant when he or she shows early signs of hunger.  Gently tickle or stroke your baby's lips with your finger or nipple to allow the baby to open his or her mouth. Bring the baby to your breast. Make sure that much of the areola is in your baby's mouth. Offer one side and burp the baby before you offer the other side.  Talk with your health care provider or lactation consultant if you have questions or you face problems as you breastfeed. This information is not intended to replace advice given to you by your health care provider. Make sure you discuss any questions you have with your health care provider. Document Revised: 12/20/2017 Document Reviewed: 10/27/2016 Elsevier Patient Education  2021 Elsevier Inc.  

## 2020-12-22 NOTE — Progress Notes (Signed)
   Subjective:  Carmen [name] is a 30 y.o. G2P1001 at [redacted]w[redacted]d being seen today for ongoing prenatal care.  She is currently monitored for the following issues for this high-risk pregnancy and has History of pulmonary embolus during previous pregnancy in 2019; Hyperemesis affecting pregnancy, antepartum; Ptyalism; Supervision of high risk pregnancy, antepartum; Alpha thalassemia silent carrier; Marijuana abuse; IUGR (intrauterine growth restriction) affecting care of mother; and Unwanted fertility on their problem list.  Patient reports no complaints.  Contractions: Irritability.  .  Movement: Present. Denies leaking of fluid.   The following portions of the patient's history were reviewed and updated as appropriate: allergies, current medications, past family history, past medical history, past social history, past surgical history and problem list. Problem list updated.  Objective:   Vitals:   12/22/20 1336  BP: 102/76  Pulse: (!) 106  Weight: 154 lb (69.9 kg)    Fetal Status: Fetal Heart Rate (bpm): 149   Movement: Present     General:  Alert, oriented and cooperative. Patient is in no acute distress.  Skin: Skin is warm and dry. No rash noted.   Cardiovascular: Normal heart rate noted  Respiratory: Normal respiratory effort, no problems with respiration noted  Abdomen: Soft, gravid, appropriate for gestational age. Pain/Pressure: Present     Pelvic:       Cervical exam deferred        Extremities: Normal range of motion.  Edema: Trace  Mental Status: Normal mood and affect. Normal behavior. Normal judgment and thought content.   Urinalysis:      Assessment and Plan:  Pregnancy: G2P1001 at [redacted]w[redacted]d  1. Supervision of high risk pregnancy, antepartum BP and FHR normal  2. History of pulmonary embolus during previous pregnancy in 2019 Cont lovenox  3. Poor fetal growth affecting management of mother in third trimester, single or unspecified fetus Last growth 3/2 EFW 7%, following  w MFM weekly, has had normal dopplers Discussed IOL and rationale, will await tomorrow's growth scan to determine delivery timing  4. Unwanted fertility BTL papers signed 11/08/2020  Preterm labor symptoms and general obstetric precautions including but not limited to vaginal bleeding, contractions, leaking of fluid and fetal movement were reviewed in detail with the patient. Please refer to After Visit Summary for other counseling recommendations.  Return in 2 weeks (on 01/05/2021) for ob visit, [facility].   Venora Maples, MD

## 2020-12-23 ENCOUNTER — Encounter: Payer: Self-pay | Admitting: *Deleted

## 2020-12-23 ENCOUNTER — Ambulatory Visit: Payer: Medicaid Other | Admitting: *Deleted

## 2020-12-23 ENCOUNTER — Ambulatory Visit: Payer: Medicaid Other | Attending: Maternal & Fetal Medicine

## 2020-12-23 DIAGNOSIS — O365930 Maternal care for other known or suspected poor fetal growth, third trimester, not applicable or unspecified: Secondary | ICD-10-CM | POA: Insufficient documentation

## 2020-12-23 DIAGNOSIS — O359XX0 Maternal care for (suspected) fetal abnormality and damage, unspecified, not applicable or unspecified: Secondary | ICD-10-CM | POA: Insufficient documentation

## 2020-12-23 DIAGNOSIS — F1920 Other psychoactive substance dependence, uncomplicated: Secondary | ICD-10-CM | POA: Diagnosis not present

## 2020-12-23 DIAGNOSIS — O99320 Drug use complicating pregnancy, unspecified trimester: Secondary | ICD-10-CM | POA: Insufficient documentation

## 2020-12-23 DIAGNOSIS — O0990 Supervision of high risk pregnancy, unspecified, unspecified trimester: Secondary | ICD-10-CM | POA: Insufficient documentation

## 2020-12-23 DIAGNOSIS — O365910 Maternal care for other known or suspected poor fetal growth, first trimester, not applicable or unspecified: Secondary | ICD-10-CM | POA: Insufficient documentation

## 2020-12-23 NOTE — Procedures (Signed)
Carmen [name] Oct 15, 1990 [redacted]w[redacted]d  Fetus A Non-Stress Test Interpretation for 12/23/20  Indication: Unsatisfactory BPP  Fetal Heart Rate A Mode: External Baseline Rate (A): 130 bpm Variability: Moderate Accelerations: 15 x 15 Decelerations: None Multiple birth?: No  Uterine Activity Mode: Palpation,Toco Contraction Frequency (min): 2 ucs Contraction Duration (sec): 40-90 Contraction Quality: Mild Resting Tone Palpated: Relaxed Resting Time: Adequate  Interpretation (Fetal Testing) Nonstress Test Interpretation: Reactive Overall Impression: Reassuring for gestational age Comments: Dr. Grace Bushy reviewed tracing.

## 2020-12-24 ENCOUNTER — Other Ambulatory Visit: Payer: Self-pay | Admitting: Maternal & Fetal Medicine

## 2020-12-24 DIAGNOSIS — O365910 Maternal care for other known or suspected poor fetal growth, first trimester, not applicable or unspecified: Secondary | ICD-10-CM

## 2020-12-24 DIAGNOSIS — F1920 Other psychoactive substance dependence, uncomplicated: Secondary | ICD-10-CM

## 2020-12-24 DIAGNOSIS — O359XX0 Maternal care for (suspected) fetal abnormality and damage, unspecified, not applicable or unspecified: Secondary | ICD-10-CM

## 2020-12-24 DIAGNOSIS — O99320 Drug use complicating pregnancy, unspecified trimester: Secondary | ICD-10-CM

## 2020-12-28 ENCOUNTER — Encounter: Payer: Self-pay | Admitting: *Deleted

## 2020-12-28 ENCOUNTER — Ambulatory Visit: Payer: Medicaid Other | Attending: Maternal & Fetal Medicine

## 2020-12-28 ENCOUNTER — Ambulatory Visit: Payer: Medicaid Other | Admitting: *Deleted

## 2020-12-28 ENCOUNTER — Other Ambulatory Visit: Payer: Self-pay

## 2020-12-28 ENCOUNTER — Other Ambulatory Visit: Payer: Self-pay | Admitting: *Deleted

## 2020-12-28 DIAGNOSIS — O365910 Maternal care for other known or suspected poor fetal growth, first trimester, not applicable or unspecified: Secondary | ICD-10-CM | POA: Insufficient documentation

## 2020-12-28 DIAGNOSIS — O359XX0 Maternal care for (suspected) fetal abnormality and damage, unspecified, not applicable or unspecified: Secondary | ICD-10-CM | POA: Diagnosis not present

## 2020-12-28 DIAGNOSIS — F1920 Other psychoactive substance dependence, uncomplicated: Secondary | ICD-10-CM | POA: Insufficient documentation

## 2020-12-28 DIAGNOSIS — O0990 Supervision of high risk pregnancy, unspecified, unspecified trimester: Secondary | ICD-10-CM

## 2020-12-28 DIAGNOSIS — O99320 Drug use complicating pregnancy, unspecified trimester: Secondary | ICD-10-CM | POA: Insufficient documentation

## 2020-12-28 DIAGNOSIS — O365930 Maternal care for other known or suspected poor fetal growth, third trimester, not applicable or unspecified: Secondary | ICD-10-CM

## 2021-01-05 ENCOUNTER — Ambulatory Visit: Payer: Medicaid Other | Admitting: *Deleted

## 2021-01-05 ENCOUNTER — Ambulatory Visit (INDEPENDENT_AMBULATORY_CARE_PROVIDER_SITE_OTHER): Payer: Medicaid Other | Admitting: Family Medicine

## 2021-01-05 ENCOUNTER — Ambulatory Visit: Payer: Medicaid Other | Attending: Maternal & Fetal Medicine

## 2021-01-05 ENCOUNTER — Other Ambulatory Visit (HOSPITAL_COMMUNITY)
Admission: RE | Admit: 2021-01-05 | Discharge: 2021-01-05 | Disposition: A | Discharge status: Home / Self Care | Payer: Medicaid Other | Source: Ambulatory Visit | Attending: Family Medicine | Admitting: Family Medicine

## 2021-01-05 ENCOUNTER — Other Ambulatory Visit: Payer: Self-pay

## 2021-01-05 ENCOUNTER — Other Ambulatory Visit: Payer: Self-pay | Admitting: *Deleted

## 2021-01-05 ENCOUNTER — Encounter: Payer: Self-pay | Admitting: *Deleted

## 2021-01-05 VITALS — BP 108/73 | HR 88 | Wt 156.0 lb

## 2021-01-05 DIAGNOSIS — O365930 Maternal care for other known or suspected poor fetal growth, third trimester, not applicable or unspecified: Secondary | ICD-10-CM | POA: Diagnosis not present

## 2021-01-05 DIAGNOSIS — O365990 Maternal care for other known or suspected poor fetal growth, unspecified trimester, not applicable or unspecified: Secondary | ICD-10-CM

## 2021-01-05 DIAGNOSIS — O99323 Drug use complicating pregnancy, third trimester: Secondary | ICD-10-CM

## 2021-01-05 DIAGNOSIS — O0990 Supervision of high risk pregnancy, unspecified, unspecified trimester: Secondary | ICD-10-CM

## 2021-01-05 DIAGNOSIS — Z86711 Personal history of pulmonary embolism: Secondary | ICD-10-CM

## 2021-01-05 DIAGNOSIS — O359XX0 Maternal care for (suspected) fetal abnormality and damage, unspecified, not applicable or unspecified: Secondary | ICD-10-CM | POA: Diagnosis not present

## 2021-01-05 DIAGNOSIS — F1290 Cannabis use, unspecified, uncomplicated: Secondary | ICD-10-CM | POA: Diagnosis not present

## 2021-01-05 DIAGNOSIS — O99320 Drug use complicating pregnancy, unspecified trimester: Secondary | ICD-10-CM | POA: Diagnosis present

## 2021-01-05 DIAGNOSIS — O365910 Maternal care for other known or suspected poor fetal growth, first trimester, not applicable or unspecified: Secondary | ICD-10-CM | POA: Insufficient documentation

## 2021-01-05 DIAGNOSIS — Z7901 Long term (current) use of anticoagulants: Secondary | ICD-10-CM

## 2021-01-05 DIAGNOSIS — Z3A36 36 weeks gestation of pregnancy: Secondary | ICD-10-CM

## 2021-01-05 DIAGNOSIS — Z3009 Encounter for other general counseling and advice on contraception: Secondary | ICD-10-CM

## 2021-01-05 DIAGNOSIS — O99891 Other specified diseases and conditions complicating pregnancy: Secondary | ICD-10-CM

## 2021-01-05 DIAGNOSIS — F1920 Other psychoactive substance dependence, uncomplicated: Secondary | ICD-10-CM | POA: Diagnosis present

## 2021-01-05 DIAGNOSIS — Z8759 Personal history of other complications of pregnancy, childbirth and the puerperium: Secondary | ICD-10-CM

## 2021-01-05 DIAGNOSIS — Z148 Genetic carrier of other disease: Secondary | ICD-10-CM

## 2021-01-05 NOTE — Progress Notes (Signed)
   Subjective:  Carmen [name] is a 30 y.o. G2P1001 at [redacted]w[redacted]d being seen today for ongoing prenatal care.  She is currently monitored for the following issues for this high-risk pregnancy and has History of pulmonary embolus during previous pregnancy in 2019; Hyperemesis affecting pregnancy, antepartum; Ptyalism; Supervision of high risk pregnancy, antepartum; Alpha thalassemia silent carrier; Marijuana abuse; IUGR (intrauterine growth restriction) affecting care of mother; and Unwanted fertility on their problem list.  Patient reports no complaints.  Contractions: Irritability. Vag. Bleeding: None.  Movement: Present. Denies leaking of fluid.   The following portions of the patient's history were reviewed and updated as appropriate: allergies, current medications, past family history, past medical history, past social history, past surgical history and problem list. Problem list updated.  Objective:   Vitals:   01/05/21 1425  BP: 108/73  Pulse: 88  Weight: 156 lb (70.8 kg)    Fetal Status: Fetal Heart Rate (bpm): 145   Movement: Present     General:  Alert, oriented and cooperative. Patient is in no acute distress.  Skin: Skin is warm and dry. No rash noted.   Cardiovascular: Normal heart rate noted  Respiratory: Normal respiratory effort, no problems with respiration noted  Abdomen: Soft, gravid, appropriate for gestational age. Pain/Pressure: Absent     Pelvic: Vag. Bleeding: None     Cervical exam deferred        Extremities: Normal range of motion.  Edema: Trace  Mental Status: Normal mood and affect. Normal behavior. Normal judgment and thought content.   Urinalysis:      Assessment and Plan:  Pregnancy: G2P1001 at [redacted]w[redacted]d  1. Supervision of high risk pregnancy, antepartum BP and FHR normal Swabs today  2. Unwanted fertility BTL papers signed in January  3. Poor fetal growth affecting management of mother in third trimester, single or unspecified fetus Resolved on most  recent US, still only at 10% I spoke directly with Dr. Grace Bushy, he would be fine with delivery anytime between 37-39 weeks given very borderline still for EFW, and he will update his recommendations in his [place] report to reflect this Patient would also prefer IOL at 38 weeks Plan for IOL at 38 weeks  4. History of pulmonary embolus during previous pregnancy in 2019 Currently on Lovenox 40mg  Discussed need to stop at least 12 hours prior to IOL  Preterm labor symptoms and general obstetric precautions including but not limited to vaginal bleeding, contractions, leaking of fluid and fetal movement were reviewed in detail with the patient. Please refer to After Visit Summary for other counseling recommendations.  Return in about 1 week (around 01/12/2021) for [facility], ob visit.   [facility], MD

## 2021-01-06 ENCOUNTER — Encounter: Payer: Self-pay | Admitting: Family Medicine

## 2021-01-06 LAB — GC/CHLAMYDIA PROBE AMP (~~LOC~~) NOT AT ARMC
Chlamydia: NEGATIVE
Comment: NEGATIVE
Comment: NORMAL
Neisseria Gonorrhea: NEGATIVE

## 2021-01-09 LAB — CULTURE, BETA STREP (GROUP B ONLY): Strep Gp B Culture: NEGATIVE

## 2021-01-10 ENCOUNTER — Ambulatory Visit: Payer: Self-pay

## 2021-01-10 NOTE — Telephone Encounter (Signed)
Pt is [redacted] weeks pregnant with 2nd child. Pt has been vomiting since 5:30 am. Pt has back pain. She was [facility]. She was monitored, given fluids, terb and discharged.  Pt is to be induced in 8 days.  Good fetal movement.  No leaking of any fluids, no swelling or visual changes.  Pt will go to [facility] for triage.   Reason for Disposition . [1] History of prior delivery (multipara) AND [2] contractions < 10 minutes apart AND [3] present 1 hour  Answer Assessment - Initial Assessment Questions 1. ONSET: "When did the symptoms begin?"        Pt has been vomiting all day - also having back pain/ cramping 2. CONTRACTIONS: "Describe the contractions that you are having." (e.g., duration, frequency, regularity, severity)     Back pain cramping 3. EDD: "What date are you expecting to deliver?"     Was to be induced in 9 days. 4. PARITY: "Have you had a baby before?" If Yes, ask: "How long did the labor last?"     yes 5. FETAL MOVEMENT: "Has the baby's movement decreased or changed significantly from normal?"     Good fetal movement 6. OTHER SYMPTOMS: "Do you have any other symptoms?" (e.g., leaking fluid from vagina, vaginal bleeding, fever, hand/facial swelling)     No leaking of fluid/ bleeding. No swelling.  Protocols used: PREGNANCY - LABOR-A-AH

## 2021-01-11 ENCOUNTER — Encounter (HOSPITAL_COMMUNITY): Payer: Self-pay | Admitting: *Deleted

## 2021-01-11 ENCOUNTER — Telehealth (HOSPITAL_COMMUNITY): Payer: Self-pay | Admitting: *Deleted

## 2021-01-11 NOTE — Telephone Encounter (Signed)
Preadmission screen  

## 2021-01-12 ENCOUNTER — Ambulatory Visit (INDEPENDENT_AMBULATORY_CARE_PROVIDER_SITE_OTHER): Payer: Medicaid Other | Admitting: Family Medicine

## 2021-01-12 ENCOUNTER — Other Ambulatory Visit: Payer: Self-pay

## 2021-01-12 VITALS — BP 123/60 | HR 105 | Wt 155.0 lb

## 2021-01-12 DIAGNOSIS — O365930 Maternal care for other known or suspected poor fetal growth, third trimester, not applicable or unspecified: Secondary | ICD-10-CM

## 2021-01-12 DIAGNOSIS — Z8759 Personal history of other complications of pregnancy, childbirth and the puerperium: Secondary | ICD-10-CM

## 2021-01-12 DIAGNOSIS — O0990 Supervision of high risk pregnancy, unspecified, unspecified trimester: Secondary | ICD-10-CM

## 2021-01-12 DIAGNOSIS — Z86711 Personal history of pulmonary embolism: Secondary | ICD-10-CM

## 2021-01-12 NOTE — Progress Notes (Signed)
   PRENATAL VISIT NOTE  Subjective:  Carmen [name] is a 30 y.o. G2P1001 at [redacted]w[redacted]d being seen today for ongoing prenatal care.  She is currently monitored for the following issues for this high-risk pregnancy and has History of pulmonary embolus during previous pregnancy in 2019; Hyperemesis affecting pregnancy, antepartum; Ptyalism; Supervision of high risk pregnancy, antepartum; Alpha thalassemia silent carrier; Marijuana abuse; IUGR (intrauterine growth restriction) affecting care of mother; and Unwanted fertility on their problem list.  Patient reports contractions since last few days.  Contractions: Irritability. Vag. Bleeding: None.  Movement: Present. Denies leaking of fluid.   The following portions of the patient's history were reviewed and updated as appropriate: allergies, current medications, past family history, past medical history, past social history, past surgical history and problem list.   Objective:   Vitals:   01/12/21 1404  BP: 123/60  Pulse: (!) 105  Weight: 155 lb (70.3 kg)    Fetal Status: Fetal Heart Rate (bpm): 147 Fundal Height: 33 cm Movement: Present  Presentation: Vertex  General:  Alert, oriented and cooperative. Patient is in no acute distress.  Skin: Skin is warm and dry. No rash noted.   Cardiovascular: Normal heart rate noted  Respiratory: Normal respiratory effort, no problems with respiration noted  Abdomen: Soft, gravid, appropriate for gestational age.  Pain/Pressure: Present     Pelvic: Cervical exam performed in the presence of a chaperone Dilation: 1 Effacement (%): 60,70 Station: -2,-1  Extremities: Normal range of motion.  Edema: Trace  Mental Status: Normal mood and affect. Normal behavior. Normal judgment and thought content.   Assessment and Plan:  Pregnancy: G2P1001 at [redacted]w[redacted]d 1. Supervision of high risk pregnancy, antepartum Continue prenatal care.   2. Poor fetal growth affecting management of mother in third trimester, single or  unspecified fetus U/s for BPP tomorrow Growth at 10% IOL at 38 wks  3. History of pulmonary embolus during previous pregnancy in 2019 On Lovenox  Term labor symptoms and general obstetric precautions including but not limited to vaginal bleeding, contractions, leaking of fluid and fetal movement were reviewed in detail with the patient. Please refer to After Visit Summary for other counseling recommendations.   Return in 1 week (on 01/19/2021).  Future Appointments  Date Time Provider Department Center  01/13/2021  7:30 AM [facility] NURSE [facility]  01/13/2021  7:45 AM [facility] US5 [facility] [place]  01/17/2021 10:40 AM MC-SCREENING [facility]-SDSC None  01/19/2021  8:40 AM MC-LD SCHED ROOM MC-INDC None  01/20/2021  8:45 AM [facility] NURSE [facility]  01/20/2021  9:00 AM [facility] US4 [facility]  01/28/2021  9:15 AM [facility] NURSE [facility]  01/28/2021  9:30 AM [facility] US3 [facility]    Reva Bores, MD'

## 2021-01-12 NOTE — Patient Instructions (Signed)

## 2021-01-13 ENCOUNTER — Inpatient Hospital Stay (HOSPITAL_COMMUNITY)
Admission: AD | Admit: 2021-01-13 | Discharge: 2021-01-17 | DRG: 806 | Disposition: A | Discharge status: Home / Self Care | Payer: Medicaid Other | Attending: Family Medicine | Admitting: Family Medicine

## 2021-01-13 ENCOUNTER — Ambulatory Visit: Payer: Medicaid Other | Admitting: *Deleted

## 2021-01-13 ENCOUNTER — Other Ambulatory Visit: Payer: Self-pay | Admitting: Obstetrics and Gynecology

## 2021-01-13 ENCOUNTER — Ambulatory Visit (HOSPITAL_BASED_OUTPATIENT_CLINIC_OR_DEPARTMENT_OTHER): Payer: Medicaid Other

## 2021-01-13 ENCOUNTER — Encounter (HOSPITAL_COMMUNITY): Payer: Self-pay | Admitting: Obstetrics & Gynecology

## 2021-01-13 ENCOUNTER — Encounter: Payer: Self-pay | Admitting: *Deleted

## 2021-01-13 DIAGNOSIS — O365930 Maternal care for other known or suspected poor fetal growth, third trimester, not applicable or unspecified: Secondary | ICD-10-CM

## 2021-01-13 DIAGNOSIS — O99324 Drug use complicating childbirth: Secondary | ICD-10-CM | POA: Diagnosis present

## 2021-01-13 DIAGNOSIS — Z885 Allergy status to narcotic agent status: Secondary | ICD-10-CM

## 2021-01-13 DIAGNOSIS — Z148 Genetic carrier of other disease: Secondary | ICD-10-CM

## 2021-01-13 DIAGNOSIS — Z8759 Personal history of other complications of pregnancy, childbirth and the puerperium: Secondary | ICD-10-CM

## 2021-01-13 DIAGNOSIS — Z87891 Personal history of nicotine dependence: Secondary | ICD-10-CM

## 2021-01-13 DIAGNOSIS — Z7901 Long term (current) use of anticoagulants: Secondary | ICD-10-CM

## 2021-01-13 DIAGNOSIS — O9902 Anemia complicating childbirth: Secondary | ICD-10-CM | POA: Diagnosis present

## 2021-01-13 DIAGNOSIS — Z86711 Personal history of pulmonary embolism: Secondary | ICD-10-CM

## 2021-01-13 DIAGNOSIS — O359XX0 Maternal care for (suspected) fetal abnormality and damage, unspecified, not applicable or unspecified: Secondary | ICD-10-CM

## 2021-01-13 DIAGNOSIS — Z20822 Contact with and (suspected) exposure to covid-19: Secondary | ICD-10-CM | POA: Diagnosis present

## 2021-01-13 DIAGNOSIS — D649 Anemia, unspecified: Secondary | ICD-10-CM | POA: Diagnosis present

## 2021-01-13 DIAGNOSIS — O0990 Supervision of high risk pregnancy, unspecified, unspecified trimester: Secondary | ICD-10-CM

## 2021-01-13 DIAGNOSIS — D563 Thalassemia minor: Secondary | ICD-10-CM | POA: Diagnosis present

## 2021-01-13 DIAGNOSIS — Z3A37 37 weeks gestation of pregnancy: Secondary | ICD-10-CM

## 2021-01-13 DIAGNOSIS — O365990 Maternal care for other known or suspected poor fetal growth, unspecified trimester, not applicable or unspecified: Secondary | ICD-10-CM | POA: Diagnosis present

## 2021-01-13 DIAGNOSIS — O322XX0 Maternal care for transverse and oblique lie, not applicable or unspecified: Secondary | ICD-10-CM | POA: Diagnosis present

## 2021-01-13 DIAGNOSIS — F1910 Other psychoactive substance abuse, uncomplicated: Secondary | ICD-10-CM | POA: Diagnosis not present

## 2021-01-13 DIAGNOSIS — O9952 Diseases of the respiratory system complicating childbirth: Secondary | ICD-10-CM | POA: Diagnosis present

## 2021-01-13 DIAGNOSIS — J45909 Unspecified asthma, uncomplicated: Secondary | ICD-10-CM | POA: Diagnosis present

## 2021-01-13 DIAGNOSIS — O365931 Maternal care for other known or suspected poor fetal growth, third trimester, fetus 1: Secondary | ICD-10-CM | POA: Diagnosis present

## 2021-01-13 DIAGNOSIS — Z3009 Encounter for other general counseling and advice on contraception: Secondary | ICD-10-CM | POA: Diagnosis present

## 2021-01-13 DIAGNOSIS — O26893 Other specified pregnancy related conditions, third trimester: Secondary | ICD-10-CM

## 2021-01-13 DIAGNOSIS — F1210 Cannabis abuse, uncomplicated: Secondary | ICD-10-CM | POA: Diagnosis present

## 2021-01-13 DIAGNOSIS — O321XX0 Maternal care for breech presentation, not applicable or unspecified: Secondary | ICD-10-CM | POA: Diagnosis present

## 2021-01-13 LAB — URINALYSIS, ROUTINE W REFLEX MICROSCOPIC
Bilirubin Urine: NEGATIVE
Glucose, UA: NEGATIVE mg/dL
Ketones, ur: NEGATIVE mg/dL
Nitrite: NEGATIVE
Protein, ur: NEGATIVE mg/dL
Specific Gravity, Urine: 1.02 (ref 1.005–1.030)
WBC, UA: >50 WBC/hpf — ABNORMAL HIGH (ref 0–5)
pH: 6 (ref 5.0–8.0)

## 2021-01-13 LAB — CBC
HCT: 28.9 % — ABNORMAL LOW (ref 36.0–46.0)
Hemoglobin: 8.9 g/dL — ABNORMAL LOW (ref 12.0–15.0)
MCH: 24.9 pg — ABNORMAL LOW (ref 26.0–34.0)
MCHC: 30.8 g/dL (ref 30.0–36.0)
MCV: 81 fL (ref 80.0–100.0)
Platelets: 178 10*3/uL (ref 150–400)
RBC: 3.57 MIL/uL — ABNORMAL LOW (ref 3.87–5.11)
RDW: 14.7 % (ref 11.5–15.5)
WBC: 7.2 10*3/uL (ref 4.0–10.5)
nRBC: 0 % (ref 0.0–0.2)

## 2021-01-13 LAB — RESP PANEL BY RT-PCR (FLU A&B, COVID) ARPGX2
Influenza A by PCR: NEGATIVE
Influenza B by PCR: NEGATIVE
SARS Coronavirus 2 by RT PCR: NEGATIVE

## 2021-01-13 LAB — DIC (DISSEMINATED INTRAVASCULAR COAGULATION)PANEL
D-Dimer, Quant: 1.64 ug/mL-FEU — ABNORMAL HIGH (ref 0.00–0.50)
Fibrinogen: 502 mg/dL — ABNORMAL HIGH (ref 210–475)
INR: 1 (ref 0.8–1.2)
Platelets: 177 10*3/uL (ref 150–400)
Prothrombin Time: 13.2 seconds (ref 11.4–15.2)
Smear Review: NONE SEEN
aPTT: 25 seconds (ref 24–36)

## 2021-01-13 LAB — TYPE AND SCREEN
ABO/RH(D): O POS
Antibody Screen: NEGATIVE

## 2021-01-13 MED ORDER — CALCIUM CARBONATE ANTACID 500 MG PO CHEW: 2.0000 | CHEWABLE_TABLET | ORAL | Status: DC | PRN

## 2021-01-13 MED ORDER — DOCUSATE SODIUM 100 MG PO CAPS: 100.0000 mg | ORAL_CAPSULE | Freq: Every day | ORAL | Status: DC

## 2021-01-13 MED ORDER — CYCLOBENZAPRINE HCL 10 MG PO TABS
10.0000 mg | ORAL_TABLET | Freq: Once | ORAL | Status: AC
  Administered 2021-01-13: 10 mg via ORAL
  Filled 2021-01-13: qty 1

## 2021-01-13 MED ORDER — FENTANYL CITRATE (PF) 100 MCG/2ML IJ SOLN
100.0000 ug | INTRAMUSCULAR | Status: DC | PRN
  Administered 2021-01-13 – 2021-01-14 (×9): 100 ug via INTRAVENOUS
  Filled 2021-01-13 (×9): qty 2

## 2021-01-13 MED ORDER — LACTATED RINGERS IV BOLUS
1000.0000 mL | Freq: Once | INTRAVENOUS | Status: AC
  Administered 2021-01-13: 1000 mL via INTRAVENOUS

## 2021-01-13 MED ORDER — ACETAMINOPHEN 325 MG PO TABS: 650.0000 mg | ORAL_TABLET | ORAL | Status: DC | PRN

## 2021-01-13 MED ORDER — ONDANSETRON HCL 4 MG/2ML IJ SOLN
4.0000 mg | Freq: Four times a day (QID) | INTRAMUSCULAR | Status: DC
  Administered 2021-01-13: 4 mg via INTRAVENOUS
  Filled 2021-01-13: qty 2

## 2021-01-13 MED ORDER — ZOLPIDEM TARTRATE 5 MG PO TABS
5.0000 mg | ORAL_TABLET | Freq: Every evening | ORAL | Status: DC | PRN
  Filled 2021-01-13: qty 1

## 2021-01-13 MED ORDER — FENTANYL CITRATE (PF) 100 MCG/2ML IJ SOLN
100.0000 ug | Freq: Once | INTRAMUSCULAR | Status: AC
  Administered 2021-01-13: 100 ug via INTRAVENOUS
  Filled 2021-01-13: qty 2

## 2021-01-13 MED ORDER — PRENATAL MULTIVITAMIN CH: 1.0000 | ORAL_TABLET | Freq: Every day | ORAL | Status: DC

## 2021-01-13 MED ORDER — LACTATED RINGERS IV SOLN: INTRAVENOUS | Status: DC

## 2021-01-13 NOTE — MAU Note (Signed)
Pt reports she has been having increased ctx since yesterday. 1-2 cm in office yesterday. Thinks her mucus  Plug came out this morning. Good fetal movement. No leaking or bleeding

## 2021-01-13 NOTE — Procedures (Signed)
Carmen [name] Apr 16, 1991 [redacted]w[redacted]d  Fetus A Non-Stress Test Interpretation for 01/13/21  Indication: Unsatisfactory BPP  Fetal Heart Rate A Mode: External Baseline Rate (A): 140 bpm Variability: Moderate Accelerations: 15 x 15 Decelerations: None Multiple birth?: No  Uterine Activity Mode: Palpation,Toco Contraction Frequency (min): occ Contraction Duration (sec): 40-60 Contraction Quality: Mild Resting Tone Palpated: Relaxed Resting Time: Adequate  Interpretation (Fetal Testing) Nonstress Test Interpretation: Reactive Overall Impression: Reassuring for gestational age Comments: Dr. Grace Bushy reviewed tracing.

## 2021-01-13 NOTE — H&P (Signed)
Chief Complaint:  Labor Eval   Event Date/Time   First Provider Initiated Contact with Patient 01/13/21 1418      HPI: Carmen [name] is a 30 y.o. G2P1001 at [redacted]w[redacted]d by early ultrasound with hx significant for PE in previous pregnancy and IUGR this pregnancy who presents to maternity admissions sent from MFM with painful abdominal cramping. Pt reports pain started in her LLQ, then moved to the top of her abdomen, then to the RLQ. It is intermittent, and increasing in intensity since onset. It radiates to her low back. There is no bleeding or leaking fluid.  She was observed in MAU x 4+ hours and on toco pt had contractions every 1 minute at times, correlating with her severe pain. She was given Fentanyl 100 mcg x 3 doses Q 1 hour for pain with little relief.  She is admitted by Dr Charlotta Newton to the [facility] for further observation.   HPI  Past Medical History: Past Medical History:  Diagnosis Date  . Anemia   . Asthma   . Panic attacks   . Pulmonary embolism, antepartum 02/08/2018   Indications: PE diagnosed 5/2@ [facility].  Left side, lower lobe;   Admitted 5/2-5/6 - heparin drip>lovenox 60BID.  ED evaluation 5/8 2/2 persistent SOB - negative DVT, nml ECG Anticoagulation goal is therapeutic  Therapeutic dose is Lovenox /kg SQ Q12--currently on 60 mg BID  Monitoring is indicated for patients on therapeutic anticoagulation  Will check Anti-Xa level 4-6 hours after 3rd dose Q trime    Past obstetric history: OB History  Gravida Para Term Preterm AB Living  0 0 1  SAB IAB Ectopic Multiple Live Births  0 0 0 0 1    # Outcome Date GA Lbr Len/2nd Weight Sex Delivery Anes PTL Lv  2 Current           1 Term 08/29/18 [redacted]w[redacted]d 35:47 / 00:29 3460 g F Vag-Spont EPI  LIV    Past Surgical History: Past Surgical History:  Procedure Laterality Date  . NO PAST SURGERIES      Family History: Family History  Problem Relation Age of Onset  . Pulmonary embolism Mother   . Ulcers Sister     Social  History: Social History   Tobacco Use  . Smoking status: Former Smoker    Types: Cigarettes  . Smokeless tobacco: Never Used  Vaping Use  . Vaping Use: Never used  Substance Use Topics  . Alcohol use: Not Currently  . Drug use: No    Allergies:  Allergies  Allergen Reactions  . Morphine And Related Anaphylaxis, Swelling and Other (See Comments)    "swells shut" the patient's throat    Meds:  Medications Prior to Admission  Medication Sig Dispense Refill Last Dose  . ferrous sulfate 325 (65 FE) MG tablet Take 1 tablet (325 mg total) by mouth daily with breakfast. 30 tablet 3 01/13/2021 at Unknown time  . Prenatal Vit-Fe Fumarate-FA (PRENATAL PO) Take 1 tablet by mouth daily.   01/13/2021 at Unknown time  . albuterol (VENTOLIN HFA) 108 (90 Base) MCG/ACT inhaler Inhale 1-2 puffs into the lungs every 6 (six) hours as needed for wheezing or shortness of breath. 18 g 2   . enoxaparin (LOVENOX) 40 MG/0.4ML injection Inject 0.4 mLs (40 mg total) into the skin daily. (Patient not taking: Reported on 01/12/2021) 30 mL 4     ROS:  Review of Systems  Constitutional: Negative for chills, fatigue and fever.  Eyes:  Negative for visual disturbance.  Respiratory: Negative for shortness of breath.   Cardiovascular: Negative for chest pain.  Gastrointestinal: Positive for abdominal pain. Negative for nausea and vomiting.  Genitourinary: Negative for difficulty urinating, dysuria, flank pain, pelvic pain, vaginal bleeding, vaginal discharge and vaginal pain.  Musculoskeletal: Positive for back pain.  Neurological: Negative for dizziness and headaches.  Psychiatric/Behavioral: Negative.      I have reviewed patient's Past Medical Hx, Surgical Hx, Family Hx, Social Hx, medications and allergies.   Physical Exam   Patient Vitals for the past 24 hrs:  BP Temp Pulse Resp Height Weight  01/13/21 1114 113/74 98 F (36.7 C) (!) 105 18  (1.499 m) 71.2 kg   Constitutional: Well-developed,  well-nourished female in no acute distress.  Cardiovascular: normal rate Respiratory: normal effort GI: Abd soft, non-tender, gravid appropriate for gestational age.  MS: Extremities nontender, no edema, normal ROM Neurologic: Alert and oriented x 4.  GU: Neg CVAT.   Dilation: 1.5 Effacement (%): 50 Cervical Position: Posterior Station: -3 Presentation: Vertex Exam by:: Sharen Counter CNM  FHT:  Baseline 130 , moderate variability, accelerations present, no decelerations Contractions: q 1-2 mins, mild to palpation   Labs: Results for orders placed or performed during the hospital encounter of 01/13/21 (from the past 24 hour(s))  CBC     Status: Abnormal   Collection Time: 01/13/21 12:00 PM  Result Value Ref Range   WBC 7.2 4.0 - 10.5 K/uL   RBC 3.57 (L) 3.87 - 5.11 MIL/uL   Hemoglobin 8.9 (L) 12.0 - 15.0 g/dL   HCT 16.1 (L) 09.6 - 04.5 %   MCV 81.0 80.0 - 100.0 fL   MCH 24.9 (L) 26.0 - 34.0 pg   MCHC 30.8 30.0 - 36.0 g/dL   RDW 40.9 81.1 - 91.4 %   Platelets 178 150 - 400 K/uL   nRBC 0.0 0.0 - 0.2 %  DIC Panel ONCE - STAT     Status: Abnormal (Preliminary result)   Collection Time: 01/13/21 12:00 PM  Result Value Ref Range   Prothrombin Time 13.2 11.4 - 15.2 seconds   INR 1.0 0.8 - 1.2   aPTT 25 24 - 36 seconds   Fibrinogen 502 (H) 210 - 475 mg/dL   D-Dimer, Quant 7.82 (H) 0.00 - 0.50 ug/mL-FEU   Platelets 177 150 - 400 K/uL   Smear Review PENDING   Type and screen     Status: None   Collection Time: 01/13/21 12:00 PM  Result Value Ref Range   ABO/RH(D) O POS    Antibody Screen NEG    Sample Expiration      01/16/2021,2359 Performed at [facility], [address], [place], [place] [address]    --/--/O POS (04/07 1200)  Imaging:  [place] MFM FETAL BPP W/NONSTRESS  Result Date: 01/13/2021 ----------------------------------------------------------------------  OBSTETRICS REPORT                       (Signed Final 01/13/2021 09:19 am)  ---------------------------------------------------------------------- Patient Info  ID #:       [id number]                          D.O.B.:  07-21-1991 (29 yrs)  Name:       Carmen [name]                  Visit Date: 01/13/2021 07:38 am ---------------------------------------------------------------------- Performed By  Attending:  Corenthian Booker      Ref. Address:     [address]                    MD                                                             [address]                                                             [place], [place]                                                             [address]  Performed By:     Reinaldo Raddle            [facility]  Referred By:      [place] Bing MD ---------------------------------------------------------------------- Orders  #  Description                           Code        Ordered By  1  [place] MFM FETAL BPP                      18841.6     RAVI [facility]     W/NONSTRESS  2  [place] MFM UA CORD DOPPLER                60630.16    RAVI [facility] ----------------------------------------------------------------------  #  Order #  Accession #                Episode #  1  [id number]                   [id number]                 [id number]  2  [id number]                   [id number]                 [id number] ---------------------------------------------------------------------- Indications  Maternal care for known or suspected poor      O36.5930  fetal growth, third trimester, not applicable or  unspecified IUGR  Small for gestational age fetus affecting      O36.5990  management of mother  [place]: Silent Genetic carrier (alpha         Z14.8  thalassemia)  Fetal abnormality - other known or              O35.9XX0  suspected (abdominal calcifications)  Substance abuse affecting pregnancy,           O99.320 F19.10  antepartum (MJ)  AFP: Neg,  PAN: Low risk, Female, 10.7%;  Neg toxo and CMV studies  Medical complication of pregnancy (History     O26.90  of PE--Lovenox)  [redacted] weeks gestation of pregnancy                Z3A.37 ---------------------------------------------------------------------- Fetal Evaluation  Num Of Fetuses:         1  Fetal Heart Rate(bpm):  136  Cardiac Activity:       Observed  Presentation:           Cephalic  P. Cord Insertion:      Visualized, central  Amniotic Fluid  AFI FV:      Within normal limits  AFI Sum(cm)     %Tile       Largest Pocket(cm)  14.12           53          5.33  RUQ(cm)       RLQ(cm)       LUQ(cm)        LLQ(cm)  3.24          5.33          3.77           1.78 ---------------------------------------------------------------------- Biophysical Evaluation  Amniotic F.V:   Pocket => 2 cm             F. Tone:        Observed  F. Movement:    Observed                   N.S.T:          Reactive  F. Breathing:   Not Observed               Score:          8/10 ---------------------------------------------------------------------- OB History  Gravidity:    2         Term:   1  Living:       1 ---------------------------------------------------------------------- Gestational Age  LMP:           38w 3d        Date:  04/19/20                 EDD:  01/24/21  Best:          37w 1d     Det. ByMarcella Dubs         EDD:   02/02/21                                      (06/15/20) ---------------------------------------------------------------------- Anatomy  Diaphragm:             Appears normal         Kidneys:                Appear normal  Stomach:               Appears normal, left   Bladder:                Appears normal                         sided ---------------------------------------------------------------------- Doppler - Fetal Vessels  Umbilical Artery   S/D     %tile      RI     %tile      PI    %tile     PSV    ADFV    RDFV                                                     (cm/s)   2.84       79    0.65       84    0.99       83    29.41      No      No ---------------------------------------------------------------------- Cervix Uterus Adnexa  Cervix  Not visualized (advanced GA >24wks) ---------------------------------------------------------------------- Impression  Antenatal testing due to IUGR with an EFW 10th% with an  AC < 47th%. ( prior exam 7th%).  Biophysical profile 8/10 (-2 breathing) with good fetal  movement and amniotic fluid volume  UA Dopplers are normal with no evidence of AEDF or REDF  Ms. Heuerman experienced painful contractions every 4-5  minutes while on the NST, which was reactive.  She is taking prophylactic lovenox for history of pulmonary  embolus.  I asked Ms Judie Grieve to go to the MAU to rule out labor.  She is scheduled for delivery on 4/13  I discussed with Dr. Charlotta Newton. ---------------------------------------------------------------------- Recommendations  Delivery scheduled on 01/19/21 ----------------------------------------------------------------------               Lin Landsman, MD Electronically Signed Final Report   01/13/2021 09:19 am ----------------------------------------------------------------------   MAU Course/MDM: Orders Placed This Encounter  Procedures  . CBC  . DIC Panel ONCE - STAT  . Contraction - monitoring  . External fetal heart monitoring  . Vaginal exam  . Type and screen    Meds ordered this encounter  Medications  . lactated ringers bolus 1,000 mL  . lactated ringers bolus 1,000 mL  . fentaNYL (SUBLIMAZE) injection 100 mcg      Assessment: 1. Abdominal pain in pregnancy, third trimester   2. Poor fetal growth affecting management of mother in third trimester, single or unspecified fetus   3. History of pulmonary embolus during previous pregnancy in 2019   4.  [redacted] weeks gestation of pregnancy      Plan: Admit for observation to [facility] Unit Continuous EFM Add Flexeril for musculoskeletal pain  Allergies as of 01/13/2021      Reactions   Morphine And Related Anaphylaxis, Swelling, Other (See Comments)   "swells shut" the patient's throat       Sharen CounterLisa Leftwich-Kirby Certified Nurse-Midwife 01/13/2021 2:19 PM

## 2021-01-13 NOTE — Progress Notes (Signed)
CTSP due to continued pain/discomfort.  Pt evaluated in person.  Pt notes ctx q 5-10 minutes with cramping in between.  Pt notes positive fetal movement, no loss of fluid/VB.  Most of her discomfort seems to be where the baby is physically located.  Abdomen did not feel continually tight or tender to palpation other than as described above.  Discussed a trial of nubain or stadol but pt seems to have an anaphylactic reaction to morphine derivatives.  Pharmacy was contacted and concurred.  Pt will receive fentanyl as previously ordered but to try [employer] for sleep.    FHT: 130s reassuring, accels noted, no decels, category 1 strip. Toco:  Irritability, occasional ctx.  A/P:  37.1 weeks abdominal pain in third trimester, irregular ctx Continue expectant management, pain control prn

## 2021-01-14 ENCOUNTER — Inpatient Hospital Stay (HOSPITAL_COMMUNITY): Payer: Medicaid Other | Admitting: Anesthesiology

## 2021-01-14 ENCOUNTER — Encounter (HOSPITAL_COMMUNITY): Payer: Self-pay | Admitting: Family Medicine

## 2021-01-14 DIAGNOSIS — Z3A37 37 weeks gestation of pregnancy: Secondary | ICD-10-CM

## 2021-01-14 DIAGNOSIS — O9902 Anemia complicating childbirth: Secondary | ICD-10-CM | POA: Diagnosis not present

## 2021-01-14 DIAGNOSIS — Z885 Allergy status to narcotic agent status: Secondary | ICD-10-CM | POA: Diagnosis not present

## 2021-01-14 DIAGNOSIS — Z87891 Personal history of nicotine dependence: Secondary | ICD-10-CM | POA: Diagnosis not present

## 2021-01-14 DIAGNOSIS — O321XX0 Maternal care for breech presentation, not applicable or unspecified: Secondary | ICD-10-CM | POA: Diagnosis not present

## 2021-01-14 DIAGNOSIS — O322XX0 Maternal care for transverse and oblique lie, not applicable or unspecified: Secondary | ICD-10-CM | POA: Diagnosis present

## 2021-01-14 DIAGNOSIS — D649 Anemia, unspecified: Secondary | ICD-10-CM | POA: Diagnosis not present

## 2021-01-14 DIAGNOSIS — O365930 Maternal care for other known or suspected poor fetal growth, third trimester, not applicable or unspecified: Secondary | ICD-10-CM | POA: Diagnosis not present

## 2021-01-14 DIAGNOSIS — J45909 Unspecified asthma, uncomplicated: Secondary | ICD-10-CM | POA: Diagnosis present

## 2021-01-14 DIAGNOSIS — D563 Thalassemia minor: Secondary | ICD-10-CM | POA: Diagnosis present

## 2021-01-14 DIAGNOSIS — O365931 Maternal care for other known or suspected poor fetal growth, third trimester, fetus 1: Secondary | ICD-10-CM | POA: Diagnosis present

## 2021-01-14 DIAGNOSIS — Z86711 Personal history of pulmonary embolism: Secondary | ICD-10-CM | POA: Diagnosis not present

## 2021-01-14 DIAGNOSIS — Z7901 Long term (current) use of anticoagulants: Secondary | ICD-10-CM | POA: Diagnosis not present

## 2021-01-14 DIAGNOSIS — O99324 Drug use complicating childbirth: Secondary | ICD-10-CM | POA: Diagnosis present

## 2021-01-14 DIAGNOSIS — F1210 Cannabis abuse, uncomplicated: Secondary | ICD-10-CM | POA: Diagnosis present

## 2021-01-14 DIAGNOSIS — Z20822 Contact with and (suspected) exposure to covid-19: Secondary | ICD-10-CM | POA: Diagnosis present

## 2021-01-14 DIAGNOSIS — O9952 Diseases of the respiratory system complicating childbirth: Secondary | ICD-10-CM | POA: Diagnosis present

## 2021-01-14 LAB — CBC
HCT: 26.8 % — ABNORMAL LOW (ref 36.0–46.0)
Hemoglobin: 8.2 g/dL — ABNORMAL LOW (ref 12.0–15.0)
MCH: 24.6 pg — ABNORMAL LOW (ref 26.0–34.0)
MCHC: 30.6 g/dL (ref 30.0–36.0)
MCV: 80.5 fL (ref 80.0–100.0)
Platelets: 165 10*3/uL (ref 150–400)
RBC: 3.33 MIL/uL — ABNORMAL LOW (ref 3.87–5.11)
RDW: 15 % (ref 11.5–15.5)
WBC: 7.1 10*3/uL (ref 4.0–10.5)
nRBC: 0 % (ref 0.0–0.2)

## 2021-01-14 LAB — RPR: RPR Ser Ql: NONREACTIVE

## 2021-01-14 MED ORDER — TERBUTALINE SULFATE 1 MG/ML IJ SOLN
0.2500 mg | Freq: Once | INTRAMUSCULAR | Status: AC
  Administered 2021-01-14: 0.25 mg via SUBCUTANEOUS

## 2021-01-14 MED ORDER — OXYTOCIN-SODIUM CHLORIDE 30-0.9 UT/500ML-% IV SOLN
1.0000 m[IU]/min | INTRAVENOUS | Status: DC
  Administered 2021-01-14: 2 m[IU]/min via INTRAVENOUS
  Filled 2021-01-14: qty 500

## 2021-01-14 MED ORDER — PHENYLEPHRINE 40 MCG/ML (10ML) SYRINGE FOR IV PUSH (FOR BLOOD PRESSURE SUPPORT): 80.0000 ug | PREFILLED_SYRINGE | INTRAVENOUS | Status: DC | PRN

## 2021-01-14 MED ORDER — TERBUTALINE SULFATE 1 MG/ML IJ SOLN: 0.2500 mg | Freq: Once | INTRAMUSCULAR | Status: DC | PRN

## 2021-01-14 MED ORDER — LACTATED RINGERS IV SOLN: 500.0000 mL | INTRAVENOUS | Status: DC | PRN

## 2021-01-14 MED ORDER — LIDOCAINE HCL (PF) 1 % IJ SOLN: 30.0000 mL | INTRAMUSCULAR | Status: DC | PRN

## 2021-01-14 MED ORDER — OXYTOCIN-SODIUM CHLORIDE 30-0.9 UT/500ML-% IV SOLN
2.5000 [IU]/h | INTRAVENOUS | Status: DC
  Filled 2021-01-14: qty 500

## 2021-01-14 MED ORDER — TERBUTALINE SULFATE 1 MG/ML IJ SOLN
INTRAMUSCULAR | Status: AC
  Filled 2021-01-14: qty 1

## 2021-01-14 MED ORDER — ACETAMINOPHEN 325 MG PO TABS: 650.0000 mg | ORAL_TABLET | ORAL | Status: DC | PRN

## 2021-01-14 MED ORDER — LIDOCAINE HCL (PF) 1 % IJ SOLN
INTRAMUSCULAR | Status: DC | PRN
  Administered 2021-01-14: 11 mL via EPIDURAL

## 2021-01-14 MED ORDER — LACTATED RINGERS IV SOLN: INTRAVENOUS | Status: DC

## 2021-01-14 MED ORDER — FENTANYL CITRATE (PF) 100 MCG/2ML IJ SOLN: 50.0000 ug | INTRAMUSCULAR | Status: DC | PRN

## 2021-01-14 MED ORDER — OXYTOCIN-SODIUM CHLORIDE 30-0.9 UT/500ML-% IV SOLN: 2.5000 [IU]/h | INTRAVENOUS | Status: DC

## 2021-01-14 MED ORDER — DIPHENHYDRAMINE HCL 50 MG/ML IJ SOLN: 12.5000 mg | INTRAMUSCULAR | Status: DC | PRN

## 2021-01-14 MED ORDER — SOD CITRATE-CITRIC ACID 500-334 MG/5ML PO SOLN: 30.0000 mL | ORAL | Status: DC | PRN

## 2021-01-14 MED ORDER — SOD CITRATE-CITRIC ACID 500-334 MG/5ML PO SOLN
30.0000 mL | ORAL | Status: DC | PRN
  Filled 2021-01-14: qty 15

## 2021-01-14 MED ORDER — FENTANYL-BUPIVACAINE-NACL 0.5-0.125-0.9 MG/250ML-% EP SOLN
12.0000 mL/h | EPIDURAL | Status: DC | PRN
  Administered 2021-01-14: 12 mL/h via EPIDURAL
  Filled 2021-01-14: qty 250

## 2021-01-14 MED ORDER — ONDANSETRON HCL 4 MG/2ML IJ SOLN: 4.0000 mg | Freq: Four times a day (QID) | INTRAMUSCULAR | Status: DC | PRN

## 2021-01-14 MED ORDER — EPHEDRINE 5 MG/ML INJ: 10.0000 mg | INTRAVENOUS | Status: DC | PRN

## 2021-01-14 MED ORDER — OXYTOCIN BOLUS FROM INFUSION: 333.0000 mL | Freq: Once | INTRAVENOUS | Status: DC

## 2021-01-14 MED ORDER — OXYTOCIN BOLUS FROM INFUSION
333.0000 mL | Freq: Once | INTRAVENOUS | Status: AC
  Administered 2021-01-15: 333 mL via INTRAVENOUS

## 2021-01-14 MED ORDER — LACTATED RINGERS IV SOLN
500.0000 mL | Freq: Once | INTRAVENOUS | Status: AC
  Administered 2021-01-14: 500 mL via INTRAVENOUS

## 2021-01-14 NOTE — Progress Notes (Signed)
Patient ID: Carmen [name], female   DOB: 12-23-1990, 30 y.o.   MRN: [id number] After informed verbal consent, Terbutaline 0.25 mg SQ given, ECV was attempted under Ultrasound guidance.  Forward roll.  ECV converted to vertex and binder placed. FHR was reactive before and after the procedure.   Pt. Tolerated the procedure well.

## 2021-01-14 NOTE — Progress Notes (Signed)
Cook catheter inserted and inflated w/80cc H20.  Cx 1.5/20/-2.  FHR Cat 1, ctx q 3-5 minutes.  Plan pitocin when catheter falls out.

## 2021-01-14 NOTE — Anesthesia Preprocedure Evaluation (Signed)
Anesthesia Evaluation  Patient identified by MRN, date of birth, ID band Patient awake    Reviewed: Allergy & Precautions, Patient's Chart, lab work & pertinent test results  History of Anesthesia Complications Negative for: history of anesthetic complications  Airway Mallampati: II  TM Distance: >3 FB Neck ROM: Full    Dental no notable dental hx. (+) Teeth Intact   Pulmonary asthma , former smoker, PE (Hx of PE on lovenox, last dose 08/22/18)   Pulmonary exam normal breath sounds clear to auscultation       Cardiovascular negative cardio ROS Normal cardiovascular exam Rhythm:Regular Rate:Normal     Neuro/Psych Anxiety negative neurological ROS     GI/Hepatic negative GI ROS, Neg liver ROS,   Endo/Other  negative endocrine ROS  Renal/GU negative Renal ROS  negative genitourinary   Musculoskeletal negative musculoskeletal ROS (+)   Abdominal   Peds negative pediatric ROS (+)  Hematology  (+) Blood dyscrasia, anemia , Hgb 8.8 on 08/22/18   Anesthesia Other Findings   Reproductive/Obstetrics (+) Pregnancy                             Anesthesia Physical  Anesthesia Plan  ASA: II  Anesthesia Plan: Epidural   Post-op Pain Management:    Induction:   PONV Risk Score and Plan: 2 and Treatment may vary due to age or medical condition  Airway Management Planned: Natural Airway  Additional Equipment:   Intra-op Plan:   Post-operative Plan:   Informed Consent: I have reviewed the patients History and Physical, chart, labs and discussed the procedure including the risks, benefits and alternatives for the proposed anesthesia with the patient or authorized representative who has indicated his/her understanding and acceptance.       Plan Discussed with: CRNA  Anesthesia Plan Comments:         Anesthesia Quick Evaluation

## 2021-01-14 NOTE — Progress Notes (Signed)
Labor Progress Note Carmen [name] is a 30 y.o. G2P1001 at [redacted]w[redacted]d presented for IOL-IUGR. S: Doing well. Has a headache, would like to defer meds at this time.  O:  BP 119/82   Pulse 95   Temp 98.1 F (36.7 C) (Oral)   Resp 18   Ht 4\' 11"  (1.499 m)   Wt 71.2 kg   LMP 04/19/2020 (Approximate)   SpO2 99%   BMI 31.71 kg/m  EFM: baseline 130bpm/mod variability/+accels/no decels Toco: q2-4 min  CVE: Dilation: 9 Effacement (%): 90 Cervical Position: Posterior Station: -2 Presentation: Vertex Exam by:: [email or web address], RN   A&P: 30 y.o. G2P1001 [redacted]w[redacted]d presented for IOL-IUGR. #IOL: S/p FB, out @1200 . Pit @1500 , currently @12mL /hr. Discussed risks/benefits of AROM with patient, patient would like to defer at this time.  #Pain: epidural #FWB: cat 1 #GBS negative #PE in prior pregnancy: was on lovenox this pregnancy, currently has SCDs on. Lovenox postpartum x6 weeks. #Anemia of pregnancy: hgb 01/13/21 was 8.9, asymptomatic. IV vs PO iron postpartum pending EBL. #THC in pregnancy: SW consult postpartum  , MD 8:31 PM

## 2021-01-14 NOTE — Anesthesia Procedure Notes (Signed)
Epidural Patient location during procedure: OB Start time: 01/14/2021 2:06 PM End time: 01/14/2021 2:36 PM  Staffing Anesthesiologist: Lowella Curb, MD Performed: anesthesiologist   Preanesthetic Checklist Completed: patient identified, IV checked, site marked, risks and benefits discussed, surgical consent, monitors and equipment checked, pre-op evaluation and timeout performed  Epidural Patient position: sitting Prep: ChloraPrep Patient monitoring: heart rate, cardiac monitor, continuous pulse ox and blood pressure Approach: midline Location: L2-L3 Injection technique: LOR saline  Needle:  Needle type: Tuohy  Needle gauge: 17 G Needle length: 9 cm Needle insertion depth: 7 cm Catheter type: closed end flexible Catheter size: 20 Guage Catheter at skin depth: 11 cm Test dose: negative  Assessment Events: blood not aspirated, injection not painful, no injection resistance, no paresthesia and negative IV test  Additional Notes Epidural placed by SRNA under direct supervisionReason for block:procedure for pain

## 2021-01-14 NOTE — Progress Notes (Signed)
Labor Progress Note Carmen [name] is a 30 y.o. G2P1001 at [redacted]w[redacted]d presented for abdominal pain in third trimester and irregular contractions.  S: Pt has had multiple doses of fentanyl this evening.  Pt has been able to rest briefly but the pain returns with a level of unexpected severity.  There has been no vaginal bleeding, and the pt does not appear to have an abruption profile.  Pt also had ultrasound today and there was no mention of placental issues.  Pt has been off her lovenox 3-4 days and was scheduled for IOL on 4/13.  Due to continuing out of bounds pain and plan for delivery between 37-39 weeks, will transfer patient to L and D to begin IOL.  Will start with foley bulb.  Midwife and L and D charge nurse are aware.  O:  BP 112/74 (BP Location: Right Arm)   Pulse (!) 101   Temp 98.2 F (36.8 C) (Oral)   Resp 18   Ht 4\' 11"  (1.499 m)   Wt 71.2 kg   LMP 04/19/2020 (Approximate)   SpO2 98%   BMI 31.71 kg/m  EFM: 140s/accels noted/no decels, category 1 strip, toco: irritability and irregular contractions.  CVE: Dilation: 1.5 Effacement (%): 50 Cervical Position: Posterior Station: -3 Presentation: Vertex Exam by:: [email or web address] CNM  Pt rechecked by self and exam is consistent with previous  A&P: 30 y.o. G2P1001 [redacted]w[redacted]d , intractable abdominal pain, IUGR, hx of PE #Labor: transfer to L and D for foley bulb and IOL Pain control through fentanyl, pt has allergy to morphine, possible early epidural   [redacted]w[redacted]d, MD 12:56 AM

## 2021-01-14 NOTE — Progress Notes (Signed)
Labor Progress Note Carmen [name] is a 30 y.o. G2P1001 at [redacted]w[redacted]d presented for IOL for IUGR  S:  Pt resting on right side in the bed, still having pain in RLQ but able to breathe through it.   O:  BP 103/68   Pulse 85   Temp 98.6 F (37 C) (Oral)   Resp 18   Ht 4\' 11"  (1.499 m)   Wt 157 lb (71.2 kg)   LMP 04/19/2020 (Approximate)   SpO2 98%   BMI 31.71 kg/m  EFM: baseline 130 bpm/ moderate variability/ 15x15 accels/ no decels  Toco/IUPC: UI Foley in place   A/P: 30 y.o. G2P1001 [redacted]w[redacted]d  1. Labor: latent 2. FWB: Cat 1 3. Pain: currently well controlled with deep breathing 4. Pt amenable to getting up to walk and use the birthing ball to facilitate cervical change.   Anticipate SVD.  [redacted]w[redacted]d, CNM 2:37 PM

## 2021-01-14 NOTE — Progress Notes (Signed)
Labor Progress Note Carmen [name] is a 30 y.o. G2P1001 at [redacted]w[redacted]d presented for IOL-IUGR. S: Doing well without complaints.  O:  BP (!) 99/50   Pulse 96   Temp 98.1 F (36.7 C) (Oral)   Resp 17   Ht 4\' 11"  (1.499 m)   Wt 71.2 kg   LMP 04/19/2020 (Approximate)   SpO2 99%   BMI 31.71 kg/m  EFM: baseline 130bpm/mod variability/+accels/no decels Toco: q1-2 min  CVE: Dilation: 9 Effacement (%): 90 Cervical Position: Posterior Station: -1 Presentation: Vertex Exam by:: [email or web address], MD   A&P: 30 y.o. G2P1001 [redacted]w[redacted]d presented for IOL-IUGR. #IOL: S/p FB, out @1200 . Pit @1500 , currently @12mL /hr. Discussed risks/benefits of AROM with patient, performed without difficulty, copious clear fluid. #Pain: epidural #FWB: cat 1 #GBS negative #PE in prior pregnancy: was on lovenox this pregnancy, currently has SCDs on. Lovenox postpartum x6 weeks. #Anemia of pregnancy: hgb 01/13/21 was 8.9, asymptomatic. IV vs PO iron postpartum pending EBL. #THC in pregnancy: SW consult postpartum  , MD 10:54 PM

## 2021-01-14 NOTE — Progress Notes (Signed)
Labor Progress Note Carmen [name] is a 30 y.o. G2P1001 at [redacted]w[redacted]d presented for IOL for IUGR  S:  Pt resting in low Fowlers post-epidural placement, still somewhat tearful but ready to proceed with plan. FOB at bedside for procedure.  O:  BP 117/71   Pulse 95   Temp 98.5 F (36.9 C) (Oral)   Resp 16   Ht 4\' 11"  (1.499 m)   Wt 157 lb (71.2 kg)   LMP 04/19/2020 (Approximate)   SpO2 99%   BMI 31.71 kg/m  EFM: baseline 130 bpm/ moderate variability/ 15x15 accels/ no decels  Toco/IUPC: irregular SVE: Dilation: 4 Effacement (%): 20 Cervical Position: Posterior Station: -2 Presentation: Vertex Exam by:: J Monigue Spraggins CNM Pitocin: low dose to start  Positioning rechecked, baby found to be frank breech with head to maternal right. ECV completed (see note from Dr. [email or web address]), abdominal binder applied.   A/P: 30 y.o. G2P1001 [redacted]w[redacted]d  1. Labor: Latent 2. FWB: cat 1 3. Pain: well controlled with epidural 4. Pitocin to start, will progress to AROM.  Anticipate SVD.  [redacted]w[redacted]d, CNM 3:53 PM

## 2021-01-14 NOTE — Progress Notes (Signed)
Labor Progress Note Carmen [name] is a 30 y.o. G2P1001 at [redacted]w[redacted]d presented for IOL for IUGR  S:  Pt had been ambulating and her foley came out. Pt calm but tearful when hand presentation found.  O:  BP 117/71   Pulse 95   Temp 98.5 F (36.9 C) (Oral)   Resp 16   Ht 4\' 11"  (1.499 m)   Wt 157 lb (71.2 kg)   LMP 04/19/2020 (Approximate)   SpO2 99%   BMI 31.71 kg/m  EFM: baseline 130 bpm/ moderate variability/ 15x15 accels/ no decels  Toco/IUPC: irregular q2-82min SVE: Dilation: 4 Effacement (%): 20 Cervical Position: Posterior Station: -2 Presentation:  (elbow/hand presentation) Exam by:: J Munira Polson CNM  Head unable to be palpated, U/S brought to bedside, hand visualized as presenting. Confirmed with Dr. [email or web address]. Baby was completely cephalic at MFM yesterday. Reassurance given to parents. Dr. Myriam Jacobson consulted and confirmed plan to reposition pt x2 then recheck and perform ECV if needed. Pt amenable to plan.   A/P: 30 y.o. G2P1001 [redacted]w[redacted]d  1. Labor: Latent 2. FWB: Cat 1 3. Pain: Well controlled with deep breathing and FOB at bedside. 4. Pt placed into hands and knees with head down to bed, will be repositioned into exaggerated left sims in [redacted]w[redacted]d.  Cautiously anticipate SVD.  , CNM 3:36 PM

## 2021-01-15 ENCOUNTER — Inpatient Hospital Stay (HOSPITAL_COMMUNITY): Payer: Medicaid Other | Admitting: Certified Registered Nurse Anesthetist

## 2021-01-15 ENCOUNTER — Encounter (HOSPITAL_COMMUNITY)
Admission: AD | Disposition: A | Discharge status: Home / Self Care | Payer: Self-pay | Source: Home / Self Care | Attending: Family Medicine

## 2021-01-15 DIAGNOSIS — D649 Anemia, unspecified: Secondary | ICD-10-CM

## 2021-01-15 DIAGNOSIS — Z3A37 37 weeks gestation of pregnancy: Secondary | ICD-10-CM

## 2021-01-15 DIAGNOSIS — O365930 Maternal care for other known or suspected poor fetal growth, third trimester, not applicable or unspecified: Secondary | ICD-10-CM

## 2021-01-15 DIAGNOSIS — O9902 Anemia complicating childbirth: Secondary | ICD-10-CM

## 2021-01-15 SURGERY — LIGATION, FALLOPIAN TUBE, POSTPARTUM
Anesthesia: Epidural

## 2021-01-15 MED ORDER — PHENYLEPHRINE HCL-NACL 20-0.9 MG/250ML-% IV SOLN
INTRAVENOUS | Status: AC
  Filled 2021-01-15: qty 250

## 2021-01-15 MED ORDER — ONDANSETRON HCL 4 MG/2ML IJ SOLN: 4.0000 mg | INTRAMUSCULAR | Status: DC | PRN

## 2021-01-15 MED ORDER — DIPHENHYDRAMINE HCL 25 MG PO CAPS: 25.0000 mg | ORAL_CAPSULE | Freq: Four times a day (QID) | ORAL | Status: DC | PRN

## 2021-01-15 MED ORDER — FERROUS SULFATE 325 (65 FE) MG PO TABS
325.0000 mg | ORAL_TABLET | ORAL | Status: DC
  Administered 2021-01-15 – 2021-01-17 (×2): 325 mg via ORAL
  Filled 2021-01-15 (×2): qty 1

## 2021-01-15 MED ORDER — COCONUT OIL OIL
1.0000 "application " | TOPICAL_OIL | Status: DC | PRN
  Administered 2021-01-15: 1 via TOPICAL

## 2021-01-15 MED ORDER — FAMOTIDINE 20 MG PO TABS
40.0000 mg | ORAL_TABLET | Freq: Once | ORAL | Status: AC
  Administered 2021-01-15: 40 mg via ORAL
  Filled 2021-01-15: qty 2

## 2021-01-15 MED ORDER — LACTATED RINGERS IV SOLN: INTRAVENOUS | Status: DC

## 2021-01-15 MED ORDER — DEXAMETHASONE SODIUM PHOSPHATE 10 MG/ML IJ SOLN
INTRAMUSCULAR | Status: AC
  Filled 2021-01-15: qty 1

## 2021-01-15 MED ORDER — ONDANSETRON HCL 4 MG/2ML IJ SOLN
INTRAMUSCULAR | Status: AC
  Filled 2021-01-15: qty 2

## 2021-01-15 MED ORDER — PHENYLEPHRINE 40 MCG/ML (10ML) SYRINGE FOR IV PUSH (FOR BLOOD PRESSURE SUPPORT)
PREFILLED_SYRINGE | INTRAVENOUS | Status: AC
  Filled 2021-01-15: qty 10

## 2021-01-15 MED ORDER — PRENATAL MULTIVITAMIN CH
1.0000 | ORAL_TABLET | Freq: Every day | ORAL | Status: DC
  Administered 2021-01-15 – 2021-01-17 (×3): 1 via ORAL
  Filled 2021-01-15 (×3): qty 1

## 2021-01-15 MED ORDER — ENOXAPARIN SODIUM 40 MG/0.4ML ~~LOC~~ SOLN: 40.0000 mg | SUBCUTANEOUS | Status: DC

## 2021-01-15 MED ORDER — TRANEXAMIC ACID-NACL 1000-0.7 MG/100ML-% IV SOLN: 1000.0000 mg | INTRAVENOUS | Status: DC

## 2021-01-15 MED ORDER — MIDAZOLAM HCL 2 MG/2ML IJ SOLN
INTRAMUSCULAR | Status: AC
  Filled 2021-01-15: qty 2

## 2021-01-15 MED ORDER — SENNOSIDES-DOCUSATE SODIUM 8.6-50 MG PO TABS
2.0000 | ORAL_TABLET | ORAL | Status: DC
  Administered 2021-01-15 – 2021-01-17 (×3): 2 via ORAL
  Filled 2021-01-15 (×3): qty 2

## 2021-01-15 MED ORDER — ENOXAPARIN SODIUM 40 MG/0.4ML ~~LOC~~ SOLN
40.0000 mg | SUBCUTANEOUS | Status: DC
  Administered 2021-01-16 – 2021-01-17 (×2): 40 mg via SUBCUTANEOUS
  Filled 2021-01-15 (×2): qty 0.4

## 2021-01-15 MED ORDER — METOCLOPRAMIDE HCL 10 MG PO TABS
10.0000 mg | ORAL_TABLET | Freq: Once | ORAL | Status: AC
  Administered 2021-01-15: 10 mg via ORAL
  Filled 2021-01-15: qty 1

## 2021-01-15 MED ORDER — TRANEXAMIC ACID-NACL 1000-0.7 MG/100ML-% IV SOLN
INTRAVENOUS | Status: AC
  Filled 2021-01-15: qty 100

## 2021-01-15 MED ORDER — WITCH HAZEL-GLYCERIN EX PADS
1.0000 "application " | MEDICATED_PAD | CUTANEOUS | Status: DC | PRN
  Administered 2021-01-15: 1 via TOPICAL

## 2021-01-15 MED ORDER — MEASLES, MUMPS & RUBELLA VAC IJ SOLR: 0.5000 mL | Freq: Once | INTRAMUSCULAR | Status: DC

## 2021-01-15 MED ORDER — SIMETHICONE 80 MG PO CHEW: 80.0000 mg | CHEWABLE_TABLET | ORAL | Status: DC | PRN

## 2021-01-15 MED ORDER — IBUPROFEN 600 MG PO TABS
600.0000 mg | ORAL_TABLET | Freq: Four times a day (QID) | ORAL | Status: DC
  Administered 2021-01-15 – 2021-01-17 (×10): 600 mg via ORAL
  Filled 2021-01-15 (×10): qty 1

## 2021-01-15 MED ORDER — FENTANYL CITRATE (PF) 100 MCG/2ML IJ SOLN
INTRAMUSCULAR | Status: AC
  Filled 2021-01-15: qty 2

## 2021-01-15 MED ORDER — BENZOCAINE-MENTHOL 20-0.5 % EX AERO
1.0000 "application " | INHALATION_SPRAY | CUTANEOUS | Status: DC | PRN
  Administered 2021-01-15: 1 via TOPICAL
  Filled 2021-01-15: qty 56

## 2021-01-15 MED ORDER — DIBUCAINE (PERIANAL) 1 % EX OINT
1.0000 | TOPICAL_OINTMENT | CUTANEOUS | Status: DC | PRN
Start: 2021-01-15 — End: 2021-01-17

## 2021-01-15 MED ORDER — TETANUS-DIPHTH-ACELL PERTUSSIS 5-2.5-18.5 LF-MCG/0.5 IM SUSY: 0.5000 mL | PREFILLED_SYRINGE | Freq: Once | INTRAMUSCULAR | Status: DC

## 2021-01-15 MED ORDER — ACETAMINOPHEN 325 MG PO TABS
650.0000 mg | ORAL_TABLET | ORAL | Status: DC | PRN
  Administered 2021-01-15 – 2021-01-17 (×3): 650 mg via ORAL
  Filled 2021-01-15 (×4): qty 2

## 2021-01-15 MED ORDER — ASCORBIC ACID 500 MG PO TABS
250.0000 mg | ORAL_TABLET | ORAL | Status: DC
Start: 2021-01-15 — End: 2021-01-17
  Administered 2021-01-15 – 2021-01-17 (×2): 250 mg via ORAL
  Filled 2021-01-15 (×2): qty 1

## 2021-01-15 MED ORDER — ONDANSETRON HCL 4 MG PO TABS: 4.0000 mg | ORAL_TABLET | ORAL | Status: DC | PRN

## 2021-01-15 NOTE — Discharge Instructions (Signed)

## 2021-01-15 NOTE — Discharge Summary (Addendum)
Postpartum Discharge Summary  Date of Service updated     Patient Name: Carmen [name] DOB: [birth year] MRN: [id number]  Date of admission: 01/13/2021 Delivery date:01/15/2021  Delivering provider: Arrie Senate  Date of discharge: 01/17/2021  Admitting diagnosis: IUGR (intrauterine growth restriction) affecting care of mother, third trimester, fetus 1 [O41.5931] IUGR (intrauterine growth restriction) affecting care of mother [O36.5990] Intrauterine pregnancy: [redacted]w[redacted]d     Secondary diagnosis:  Active Problems:   History of pulmonary embolus during previous pregnancy in 2019   Supervision of high risk pregnancy, antepartum   Alpha thalassemia silent carrier   Marijuana abuse   IUGR (intrauterine growth restriction) affecting care of mother   Unwanted fertility   IUGR (intrauterine growth restriction) affecting care of mother, third trimester, fetus 1   Vaginal delivery   Second degree perineal laceration  Additional problems: none    Discharge diagnosis: Term Pregnancy Delivered and Anemia                                              Post partum procedures: NA Augmentation: AROM, Pitocin and IP Foley Complications: None  Hospital course: Induction of Labor With Vaginal Delivery   30 y.o. yo G2P1001 at [redacted]w[redacted]d was admitted to the hospital 01/13/2021 for induction of labor.  Indication for induction:  FGR .  Patient had an uncomplicated labor course as follows: Membrane Rupture Time/Date: 10:51 PM ,01/14/2021   Delivery Method:Vaginal, Spontaneous  Episiotomy: None  Lacerations:  2nd degree  Details of delivery can be found in separate delivery note.  Patient had a routine postpartum course. Patient is discharged home 01/17/21.  Newborn Data: Birth date:01/15/2021  Birth time:1:29 AM  Gender:Female  Living status:Living  Apgars:9 ,9  Weight:2381 g   Magnesium Sulfate received: No BMZ received: No Rhophylac:N/A MMR:N/A T-DaP:Given prenatally Flu:  No Transfusion:No  Physical exam  Vitals:   01/16/21 0816 01/16/21 1415 01/16/21 2138 01/17/21 0630  BP: 96/63 100/64 116/61 113/82  Pulse: 92 (!) 105 (!) 107 98  Resp: $Remo'16 16 18 18  'TBtsC$ Temp: 98.7 F (37.1 C) 99 F (37.2 C) 98.3 F (36.8 C) 98.9 F (37.2 C)  TempSrc: Oral Oral Oral Oral  SpO2: 100% 100% 100%   Weight:      Height:       General: alert, cooperative and no distress. Patient was reporting perineum pain earlier this morning but after BM she reports she feels "so much better".  Lochia: appropriate Uterine Fundus: firm Incision: N/A DVT Evaluation: No evidence of DVT seen on physical exam. Labs: Lab Results  Component Value Date   WBC 7.1 01/14/2021   HGB 8.2 (L) 01/14/2021   HCT 26.8 (L) 01/14/2021   MCV 80.5 01/14/2021   PLT 165 01/14/2021   CMP Latest Ref Rng & Units 08/21/2020  Glucose 70 - 99 mg/dL 91  BUN 6 - 20 mg/dL 9  Creatinine 0.44 - 1.00 mg/dL 0.49  Sodium 135 - 145 mmol/L 134(L)  Potassium 3.5 - 5.1 mmol/L 3.6  Chloride 98 - 111 mmol/L 104  CO2 22 - 32 mmol/L 22  Calcium 8.9 - 10.3 mg/dL 8.7(L)  Total Protein 6.5 - 8.1 g/dL 6.3(L)  Total Bilirubin 0.3 - 1.2 mg/dL 0.5  Alkaline Phos 38 - 126 U/L 45  AST 15 - 41 U/L 14(L)  ALT 0 - 44 U/L 9   Edinburgh Score:  Edinburgh Postnatal Depression Scale Screening Tool 01/15/2021  I have been able to laugh and see the funny side of things. 0  I have looked forward with enjoyment to things. 0  I have blamed myself unnecessarily when things went wrong. 1  I have been anxious or worried for no good reason. 1  I have felt scared or panicky for no good reason. 0  Things have been getting on top of me. 0  I have been so unhappy that I have had difficulty sleeping. 0  I have felt sad or miserable. 0  I have been so unhappy that I have been crying. 1  The thought of harming myself has occurred to me. 0  Edinburgh Postnatal Depression Scale Total 3     After visit meds:  Allergies as of 01/17/2021        Reactions   Morphine And Related Anaphylaxis, Swelling, Other (See Comments)   "swells shut" the patient's throat        Medication List     TAKE these medications    albuterol 108 (90 Base) MCG/ACT inhaler Commonly known as: VENTOLIN HFA Inhale 1-2 puffs into the lungs every 6 (six) hours as needed for wheezing or shortness of breath.   enoxaparin 40 MG/0.4ML injection Commonly known as: LOVENOX Inject 0.4 mLs (40 mg total) into the skin daily.   ferrous sulfate 325 (65 FE) MG tablet Take 1 tablet (325 mg total) by mouth daily with breakfast.   prenatal multivitamin Tabs tablet Take 1 tablet by mouth daily at 12 noon.         Discharge home in stable condition Infant Feeding: Breast Infant Disposition:home with mother Discharge instruction: per After Visit Summary and Postpartum booklet. Activity: Advance as tolerated. Pelvic rest for 6 weeks.  Diet: routine diet Future Appointments: No future appointments. Follow up Visit:  Message sent to [facility] 01/15/21 by Sylvester Harder.   Please schedule this patient for a In person postpartum visit in 6 weeks with the following provider: Any provider. Additional Postpartum F/U:2 hour GTT (early a1c 5.7) High risk pregnancy complicated by: Bleeding disorder on Lovenox (history of PE in prior pregnancy) Delivery mode:  Vaginal, Spontaneous  Anticipated Birth Control:   POPs with plan for outpatient Liletta -Continue lovenox PP; RX send to pharmacy on file  01/17/2021 Starr Lake, CNM

## 2021-01-15 NOTE — Anesthesia Preprocedure Evaluation (Signed)
Anesthesia Evaluation  Patient identified by MRN, date of birth, ID band Patient awake    Reviewed: Allergy & Precautions, NPO status , Patient's Chart, lab work & pertinent test results  History of Anesthesia Complications Negative for: history of anesthetic complications  Airway Mallampati: II  TM Distance: >3 FB Neck ROM: Full    Dental no notable dental hx. (+) Teeth Intact   Pulmonary neg pulmonary ROS, asthma , former smoker, PE (Hx of PE on lovenox, last dose 08/22/18)   Pulmonary exam normal breath sounds clear to auscultation       Cardiovascular negative cardio ROS Normal cardiovascular exam Rhythm:Regular Rate:Normal     Neuro/Psych Anxiety negative neurological ROS  negative psych ROS   GI/Hepatic negative GI ROS, Neg liver ROS,   Endo/Other  negative endocrine ROS  Renal/GU negative Renal ROS  negative genitourinary   Musculoskeletal negative musculoskeletal ROS (+)   Abdominal   Peds negative pediatric ROS (+)  Hematology negative hematology ROS (+) Blood dyscrasia, anemia , Hgb 8.8 on 08/22/18   Anesthesia Other Findings   Reproductive/Obstetrics negative OB ROS                             Anesthesia Physical  Anesthesia Plan  ASA: II  Anesthesia Plan: Epidural   Post-op Pain Management:    Induction:   PONV Risk Score and Plan: 2 and Treatment may vary due to age or medical condition, Ondansetron and Midazolam  Airway Management Planned: Natural Airway  Additional Equipment:   Intra-op Plan:   Post-operative Plan:   Informed Consent: I have reviewed the patients History and Physical, chart, labs and discussed the procedure including the risks, benefits and alternatives for the proposed anesthesia with the patient or authorized representative who has indicated his/her understanding and acceptance.       Plan Discussed with: CRNA  Anesthesia Plan  Comments:         Anesthesia Quick Evaluation

## 2021-01-15 NOTE — Lactation Note (Signed)
This note was copied from a baby's chart. Lactation Consultation Note  Patient Name: Carmen [name] XAJOI'N Date: 01/15/2021 Reason for consult: Follow-up assessment;Early term 37-38.6wks;Infant < 6lbs Age:32 hours  WIC referral form faxed successfully to [facility], mom doesn't have a pump at home.  Maternal Data    Feeding Mother's Current Feeding Choice: Breast Milk and Donor Milk Nipple Type: Extra Slow Flow  LATCH Score                    Lactation Tools Discussed/Used Tools: Pump;Coconut oil;Flanges Flange Size: 24 Breast pump type: Double-Electric Breast Pump Pump Education: Setup, frequency, and cleaning;Milk Storage Reason for Pumping: ETI < 6 lbs Pumping frequency: q 3 hours Pumped volume:  (drops)  Interventions Interventions: Breast feeding basics reviewed;DEBP;Expressed milk;Coconut oil  Discharge Pump: DEBP WIC Program: Yes  Consult Status Consult Status: Follow-up Date: 01/16/21 Follow-up type: In-patient    Carmen [name] 01/15/2021, 11:20 PM

## 2021-01-15 NOTE — Lactation Note (Signed)
This note was copied from a baby's chart. Lactation Consultation Note  Patient Name: Carmen [name] [id number] Date: 01/15/2021 Reason for consult: L&D Initial assessment;Early term 37-38.6wks Age:32 hours  Mom is experienced at breastfeeding she breastfeed her [age] year old daughter for 18 months. LC entered the room, Mom was doing STS and infant was cuing to breastfeed. Mom made few attempts to latch infant on her left breast using the football hold, infant suck top lip inward and his tongue, LC fitted mom with 24 mm NS to get infant extend tongue downward. Infant latched off and on breast for 15 minutes and afterwards was given 5 mls of colostrum by spoon. Mom will continue to work towards latching infant at the breast, flanging his top lip out and using 24 NS until infant opens mouth wider and stops suckling on his top and lifting it to the roof of his mouth. Mom will try to latch infant tomorrow morning without NS. Mom knows to breastfeed infant according to cues. 8 to 12+ or more times, STS. LC discussed infant's input and output with parents. Mom knows to call RN or LC for latch assistance if needed.  Maternal Data    Feeding Mother's Current Feeding Choice: Breast Milk  LATCH Score Latch: Repeated attempts needed to sustain latch, nipple held in mouth throughout feeding, stimulation needed to elicit sucking reflex.  Audible Swallowing: A few with stimulation  Type of Nipple: Everted at rest and after stimulation  Comfort (Breast/Nipple): Soft / non-tender  Hold (Positioning): Full assist, staff holds infant at breast  LATCH Score: 6   Lactation Tools Discussed/Used    Interventions Interventions: Breast feeding basics reviewed;Skin to skin;Hand express;Adjust position;Support pillows;Position options;Expressed milk  Discharge Pump: Personal WIC Program: Yes  Consult Status Consult Status: Follow-up Date: 01/15/21 Follow-up type: In-patient    Danelle Earthly 01/15/2021, 3:06 AM

## 2021-01-15 NOTE — Lactation Note (Signed)
This note was copied from a baby's chart. Lactation Consultation Note  Patient Name: Boy Eithel Ryall SHUOH'F Date: 01/15/2021   Age:32 hours  Attempted to visit with mom but baby was having her hearing test. LC will come back later tonight for F/U asst.  Maternal Data    Feeding    LATCH Score Latch: Too sleepy or reluctant, no latch achieved, no sucking elicited.  Audible Swallowing: None  Type of Nipple: Everted at rest and after stimulation  Comfort (Breast/Nipple): Soft / non-tender  Hold (Positioning): Assistance needed to correctly position infant at breast and maintain latch.  LATCH Score: 5   Lactation Tools Discussed/Used Tools: Pump Breast pump type: Double-Electric Breast Pump Pump Education: Setup, frequency, and cleaning Reason for Pumping: difficulty getting sustained latches Pumping frequency: Q3H/ or every feeding  Interventions    Discharge    Consult Status      Adilynne Fitzwater Venetia Constable 01/15/2021, 8:37 PM

## 2021-01-15 NOTE — Anesthesia Postprocedure Evaluation (Signed)
Anesthesia Post Note  Patient: Carmen [name]  Procedure(s) Performed: AN AD HOC LABOR EPIDURAL     Patient location during evaluation: Mother Baby Anesthesia Type: Epidural Level of consciousness: awake and alert and oriented Pain management: satisfactory to patient Vital Signs Assessment: post-procedure vital signs reviewed and stable Respiratory status: spontaneous breathing and nonlabored ventilation Cardiovascular status: stable Postop Assessment: no headache, no backache, no signs of nausea or vomiting, adequate PO intake, patient able to bend at knees and able to ambulate (patient up walking) Anesthetic complications: no   No complications documented.  Last Vitals:  Vitals:   01/15/21 0510 01/15/21 0857  BP: 113/73 94/64  Pulse: 91 89  Resp: 18 18  Temp: 37.4 C 36.8 C  SpO2: 100%     Last Pain:  Vitals:   01/15/21 0857  TempSrc: Oral  PainSc:    Pain Goal: Patients Stated Pain Goal: 10 (01/14/21 1224)                 Madison Hickman

## 2021-01-15 NOTE — Plan of Care (Signed)
  Problem: Education: Goal: Knowledge of General Education information will improve Description: Including pain rating scale, medication(s)/side effects and non-pharmacologic comfort measures Outcome: Adequate for Discharge   

## 2021-01-15 NOTE — Lactation Note (Signed)
This note was copied from a baby's chart. Lactation Consultation Note  Patient Name: Carmen [name] [id number] Date: 01/15/2021 Reason for consult: Follow-up assessment;Early term 37-38.6wks;Infant < 6lbs; SGA Age:32 hours  Visited with mom of 20 hours old ETI female < 6 lbs, she's a P2. Mom reported baby has been nursing on and off for 8 minutes on the last feed and that she's getting more colostrum with hand expression (about 0.5 ml) than with pumping (only 2 drops).   Reviewed [facility] policy due to baby's birth weight, and educated parents on supplementation guidelines since mom is not pumping required volumes yet. LC offered donor milk and parents agreed and signed form, mom wants to keep her baby exclusively on an exclusive breastmilk diet, just like she did with her first one (she's 32 y.o now) she had an oversupply and thought about donating her milk.  LC showed parents the pace feeding technique with an extra slow flow nipple and baby took 4 ml of donor milk. Noticed that baby is still thrusting his tongue and was a little spitty. LC had to do some suck training to establish a sucking pattern and baby would still thrust and gag on gloved finger. LC burped baby several times during feeding.  Reviewed normal newborn behavior, cluster feeding, feeding cues, size of baby's stomach and pumping schedule. Mom didn't have to use the NS again, she's been latching baby to the naked breast so far.   Feeding plan:  1. Encouraged mom to keep putting baby to breast 8-12 times/24 hours or sooner if feeding cues are present 2. She'll pump every 3 hours and will offer any amount of EBM she may get 3. Parents will supplement with donor milk every 3 hours according to [facility] policy (they have green handout)  FOB present and supportive. Parents reported all questions and concerns were answered, they're both aware of LC OP services and will call PRN.   Maternal Data    Feeding Mother's Current Feeding Choice:  Breast Milk and Donor Milk Nipple Type: Extra Slow Flow  LATCH Score Latch: Too sleepy or reluctant, no latch achieved, no sucking elicited.  Audible Swallowing: None  Type of Nipple: Everted at rest and after stimulation  Comfort (Breast/Nipple): Soft / non-tender  Hold (Positioning): Assistance needed to correctly position infant at breast and maintain latch.  LATCH Score: 5   Lactation Tools Discussed/Used Tools: Pump;Coconut oil;Flanges Flange Size: 24 Breast pump type: Double-Electric Breast Pump Pump Education: Setup, frequency, and cleaning;Milk Storage Reason for Pumping: ETI < 6 lbs Pumping frequency: q 3 hours Pumped volume:  (drops)  Interventions Interventions: Breast feeding basics reviewed;DEBP;Expressed milk;Coconut oil  Discharge Pump: DEBP WIC Program: Yes  Consult Status Consult Status: Follow-up Date: 01/16/21 Follow-up type: In-patient    Jaclene Bartelt Venetia Constable 01/15/2021, 10:03 PM

## 2021-01-16 NOTE — Social Work (Addendum)
CSW received consult for hx of marijuana use.  Referral was screened out due to the following:  ~MOB had no documented substance use after initial prenatal visit/+UPT. ~MOB had no positive drug screens after initial prenatal visit/+UPT. ~Infant UDS is negative.   CSW reviewed chart and notes it is reported that MOB has not used since confirming pregnancy.  CSW will continue to follow CDS and make a CPS report if warranted.   CSW notes a hx of panic attacks.  * Referral screened out by Clinical Social Worker because none of the following criteria appear to apply:  ~ History of anxiety/depression during this pregnancy, or of post-partum depression following prior delivery. ~ Diagnosis of anxiety and/or depression within last 3 years. CSW notes a diagnosis date of 2017 or prior. OR * MOB's symptoms currently being treated with medication and/or therapy.  Please contact the Clinical Social Worker if needs arise, by MOB request, or if MOB scores greater than 9/yes to question 10 on Edinburgh Postpartum Depression Screen.  Lavere Shinsky, LCSWA Clinical Social Work [facility]  [phone] 

## 2021-01-16 NOTE — Progress Notes (Signed)
Post Partum Day 1 Subjective: Patient is doing well without complaints. Ambulating without difficulty. Voiding and passing flatus. Tolerating PO. Abdominal pain improved. Vaginal bleeding decreased.  Objective: Blood pressure 113/66, pulse (!) 101, temperature 98.4 F (36.9 C), temperature source Oral, resp. rate 18, height 4\' 11"  (1.499 m), weight 71.2 kg, last menstrual period 04/19/2020, SpO2 100 %, unknown if currently breastfeeding.  Physical Exam:  General: alert, cooperative and no distress Lochia: appropriate Uterine Fundus: firm Incision: n/a DVT Evaluation: No evidence of DVT seen on physical exam.  Recent Labs    01/13/21 1200 01/14/21 0729  HGB 8.9* 8.2*  HCT 28.9* 26.8*    Assessment/Plan: PPD#1 VD  -doing well, meeting pp milestones  -breastfeeding, going well  -POPS-->OP liletta  -desires circ for baby, previously consented  #Anemia of pregnancy  -asymptomatic  -PO iron  #History of PE prior pregnancy  -discharge with lovenox x6 weeks postpartum  -no signs/sxs of VTE today  Plan for discharge tomorrow per maternal request.   LOS: 2 days   03/16/21 01/16/2021, 7:44 AM

## 2021-01-16 NOTE — Lactation Note (Signed)
This note was copied from a baby's chart. Lactation Consultation Note  Patient Name: Boy Derita Michelsen QIONG'E Date: 01/16/2021 Reason for consult: Follow-up assessment;Early term 37-38.6wks;Infant < 6lbs Age:32 hours   LC Student entered the room to FOB holding the infant while mom was getting comfortable in the bed. [facility] Student asked the mother how feedings were going at this point in time. MOB described feedings to be going a lot better as of lately and that they had gotten onto a schedule.  [facility] Student asked when the last feeding had occurred; Baby had just finished feeding 15 minutes prior to [facility] Student entering the room; Mom reports feeding baby approximately 15 mLs of DBM with slow flow nipple & bottle. Baby was cueing by sucking on fingers and hands; LC Student asked mom if she was interested in trying to latch baby.  We attempted to latch baby and he was not interested. Lots of tongue thrusting and a lack of interest, possibly from previous feeding. Hand expressed colostrum easily with attempted latch. Moved on to discuss cluster feeding as well as pumping schedules/goals, and milk storage guidelines.  Mom has solidified her pumping schedule and is pumping after every feeding; her last pumping session was around 3:50 pm and she got 30 mLs from both breasts combined; mom fed EBM back to baby after DBM feeding. [facility] Student praised mom for her efforts and encouraged her to keep going and to be sure to maintain to keep milk supply up.  [facility] Student asked if there were any other questions or concerns that needed to be addressed at this time; they declined at this time.  Feeding Plan Feed baby 8-12x/24 hrs  Feed baby expressed breast milk first, then donor breast milk for supplementing Pump with every feeding Encourage STS as much as possible  Maternal Data Has patient been taught Hand Expression?: Yes Does the patient have breastfeeding experience prior to this delivery?:  Yes  Feeding Mother's Current Feeding Choice: Breast Milk and Donor Milk Nipple Type: Extra Slow Flow  LATCH Score                    Lactation Tools Discussed/Used    Interventions    Discharge    Consult Status Consult Status: Follow-up Date: 01/17/21 Follow-up type: In-patient    Sharron Simpson Terrilee Croak 01/16/2021, 4:38 PM

## 2021-01-16 NOTE — Plan of Care (Signed)
  Problem: Education: Goal: Knowledge of General Education information will improve Description: Including pain rating scale, medication(s)/side effects and non-pharmacologic comfort measures Outcome: Completed/Met   Problem: Clinical Measurements: Goal: Ability to maintain clinical measurements within normal limits will improve Outcome: Completed/Met Goal: Will remain free from infection Outcome: Completed/Met   Problem: Activity: Goal: Risk for activity intolerance will decrease Outcome: Completed/Met   Problem: Nutrition: Goal: Adequate nutrition will be maintained Outcome: Completed/Met   Problem: Elimination: Goal: Will not experience complications related to bowel motility Outcome: Completed/Met Goal: Will not experience complications related to urinary retention Outcome: Completed/Met   Problem: Pain Managment: Goal: General experience of comfort will improve Outcome: Completed/Met   Problem: Safety: Goal: Ability to remain free from injury will improve Outcome: Completed/Met

## 2021-01-17 ENCOUNTER — Encounter (HOSPITAL_COMMUNITY): Payer: Self-pay | Admitting: Family Medicine

## 2021-01-17 ENCOUNTER — Other Ambulatory Visit (HOSPITAL_COMMUNITY): Payer: Medicaid Other | Attending: Obstetrics and Gynecology

## 2021-01-17 MED ORDER — POLYETHYLENE GLYCOL 3350 17 G PO PACK
17.0000 g | PACK | Freq: Two times a day (BID) | ORAL | Status: DC
  Administered 2021-01-17: 17 g via ORAL
  Filled 2021-01-17: qty 1

## 2021-01-17 MED ORDER — ENOXAPARIN SODIUM 40 MG/0.4ML ~~LOC~~ SOLN: 40.0000 mg | SUBCUTANEOUS | 4 refills | Status: DC

## 2021-01-17 NOTE — Lactation Note (Signed)
This note was copied from a baby's chart. Lactation Consultation Note  Patient Name: Carmen [name] Date: 01/17/2021 Reason for consult: Follow-up assessment Age:32 hours   P2 mother whose infant is now 76 hours old.  This is an ETI at 37+3 weeks.  Mother breast fed her first child (now 8 years old) for 18 months.  Mother has been breast feeding and supplementing with donor breast milk.  She has been pumping and has her own EBM available for supplementation.  Mother had no questions/concerns related to breast feeding.  Encouraged her to continue feeding at least every three hours and more frequently as baby desires.  She should continue to supplement after every feeding with 30 mls or more.  Baby is currently consuming adequate supplementation volumes.  Mother is familiar with hand expression.  She has been leaking and asked for breast pads which I provided.  NT in room during my visit to take baby for his circumcision.  Discussed feeding after circumcision and reminded mother to continue supplementing and pumping as discussed.  If he is extra sleepy she will spend no more than 5-10 minutes on latching prior to supplementation.  Engorgement prevention/treatment reviewed.  Mother has a manual pump and a DEBP for home use.  She will be obtaining a DEBP from the [facility] department.  Father present and assisting with newborn care.   Maternal Data    Feeding Nipple Type: Slow - flow  LATCH Score                    Lactation Tools Discussed/Used    Interventions Interventions: Education  Discharge Discharge Education: Engorgement and breast care  Consult Status Consult Status: Complete Date: 01/17/21 Follow-up type: Call as needed    Perrie Ragin R Carley Glendenning 01/17/2021, 9:17 AM

## 2021-01-17 NOTE — Progress Notes (Signed)
Notified Carmen [name] CMW patient complains of increasing perineal pain, and has not had a bowel movement since 4/6 or 4/7 per patient. See new orders.

## 2021-01-19 ENCOUNTER — Inpatient Hospital Stay (HOSPITAL_COMMUNITY)
Admission: AD | Admit: 2021-01-19 | Payer: Medicaid Other | Source: Home / Self Care | Admitting: Obstetrics and Gynecology

## 2021-01-19 ENCOUNTER — Inpatient Hospital Stay (HOSPITAL_COMMUNITY): Payer: Medicaid Other

## 2021-01-20 ENCOUNTER — Ambulatory Visit: Payer: Medicaid Other

## 2021-01-28 ENCOUNTER — Inpatient Hospital Stay (HOSPITAL_COMMUNITY): Payer: Medicaid Other

## 2021-01-28 ENCOUNTER — Ambulatory Visit: Payer: Medicaid Other

## 2021-02-28 ENCOUNTER — Other Ambulatory Visit: Payer: Self-pay | Admitting: General Practice

## 2021-02-28 DIAGNOSIS — O24919 Unspecified diabetes mellitus in pregnancy, unspecified trimester: Secondary | ICD-10-CM

## 2021-03-04 ENCOUNTER — Encounter: Payer: Self-pay | Admitting: Advanced Practice Midwife

## 2021-03-04 ENCOUNTER — Telehealth (INDEPENDENT_AMBULATORY_CARE_PROVIDER_SITE_OTHER): Payer: Medicaid Other | Admitting: Advanced Practice Midwife

## 2021-03-04 ENCOUNTER — Other Ambulatory Visit: Payer: Medicaid Other

## 2021-03-04 DIAGNOSIS — Z3009 Encounter for other general counseling and advice on contraception: Secondary | ICD-10-CM

## 2021-03-04 DIAGNOSIS — Z1331 Encounter for screening for depression: Secondary | ICD-10-CM | POA: Diagnosis not present

## 2021-03-04 MED ORDER — NORETHINDRONE 0.35 MG PO TABS: 1.0000 | ORAL_TABLET | Freq: Every day | ORAL | 11 refills | Status: DC

## 2021-03-04 NOTE — Progress Notes (Signed)
Provider location: [facility] [facility]   Patient location: Home  I connected with@ on 03/04/21 at  9:15 AM EDT by Mychart Video Encounter and verified that I am speaking with the correct person using two identifiers.       I discussed the limitations, risks, security and privacy concerns of performing an evaluation and management service virtually and the availability of in person appointments. I also discussed with the patient that there may be a patient responsible charge related to this service. The patient expressed understanding and agreed to proceed.  Post Partum Visit Note Subjective:   Carmen [name] is a 30 y.o. G58P1001 female who presents for a postpartum visit. She is 6 weeks postpartum following a normal spontaneous vaginal delivery.  I have fully reviewed the prenatal and intrapartum course. The delivery was at 37/3 gestational weeks.  Anesthesia: epidural. Postpartum course has been normal. Baby is doing well. Baby is feeding by breast. Bleeding staining only. Bowel function is normal. Bladder function is normal. Patient is not sexually active. Contraception method is none. Postpartum depression screening: positive.   The pregnancy intention screening data noted above was reviewed. Potential methods of contraception were discussed. The patient elected to proceed with Oral Contraceptive.    Edinburgh Postnatal Depression Scale - 03/04/21 0937      Edinburgh Postnatal Depression Scale:  In the Past 7 Days   I have been able to laugh and see the funny side of things. 1    I have looked forward with enjoyment to things. 1    I have blamed myself unnecessarily when things went wrong. 0    I have been anxious or worried for no good reason. 1    I have felt scared or panicky for no good reason. 0    Things have been getting on top of me. 2    I have been so unhappy that I have had difficulty sleeping. 1    I have felt sad or miserable. 2    I  have been so unhappy that I have been crying. 1    The thought of harming myself has occurred to me. 0    Edinburgh Postnatal Depression Scale Total 9           The following portions of the patient's history were reviewed and updated as appropriate: allergies, current medications, past family history, past medical history, past social history, past surgical history and problem list.  Review of Systems Pertinent items noted in HPI and remainder of comprehensive ROS otherwise negative.  Objective:  Breastfeeding Yes     General:  Alert, oriented and cooperative. Patient is in no acute distress.  Respiratory: Normal respiratory effort, no problems with respiration noted  Mental Status: Normal mood and affect. Normal behavior. Normal judgment and thought content.  Rest of physical exam deferred due to type of encounter   Assessment:    Normal postpartum exam.  Plan:  Essential components of care per ACOG recommendations:  1.  Mood and well being: Patient with positive depression screening today. Refer to integrated behavioral health. Reviewed local resources for support.  - Patient does not use tobacco. If using tobacco we discussed reduction and for recently cessation risk of relapse - hx of drug use? No   If yes, discussed support systems  2. Infant care and feeding:  -Patient currently breastmilk feeding? Yes  If breastmilk feeding discussed return to work and pumping. If needed, patient was provided letter  for work to allow for every 2-3 hr pumping breaks, and to be granted a private location to express breastmilk and refrigerated area to store breastmilk. Reviewed importance of draining breast regularly to support lactation. -Social determinants of health (SDOH) reviewed in EPIC. No concerns  3. Sexuality, contraception and birth spacing - Patient does not want a pregnancy in the next year.  She had planned a BTL but decided against it at the hospital.  She is unsure but wants  to start a pill for contraception.   - Reviewed forms of contraception in tiered fashion. Patient desired oral progesterone-only contraceptive today.  Discussed failure rates of OCPs with regular use. Will need to remain on progesterone only due to hx DVT/PE.   - Discussed birth spacing of 18 months --F/u with new contraceptive method in 2-3 months  4. Sleep and fatigue -Encouraged family/partner/community support of 4 hrs of uninterrupted sleep to help with mood and fatigue  5. Physical Recovery  - Discussed patients delivery without complications - Patient had no laceration, perineal healing reviewed. Patient expressed understanding - Patient has urinary incontinence? No - Patient is safe to resume physical and sexual activity  6.  Health Maintenance - Last pap smear not done during pregnancy, pt unsure of last pap.   --Pap with follow up visit in 2-3 months   7. 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 Pt can stop Lovenox now at 6 weeks PP but should seek care early in any pregnancy for anticoagulation.   --Contraception should be nonhormonal or progesterone only given risks. - PCP follow up  I provided 10 minutes of face-to-face time during this encounter.    Return in about 1 year (around 03/04/2022).  No future appointments.  Sharen Counter, CNM [facility] [employer], [facility]

## 2021-03-04 NOTE — Progress Notes (Deleted)
Duplicate

## 2021-03-04 NOTE — Progress Notes (Signed)
Pt states will take BP later & note in My Chart.

## 2021-03-30 NOTE — BH Specialist Note (Signed)
Pt did not arrive to video visit and did not answer the phone; Left HIPPA-compliant message to call back Kynsie Falkner from [facility] at  [phone] ([place] Winslow's office).  ?; left MyChart message for patient.  ? ?

## 2021-04-05 ENCOUNTER — Encounter (HOSPITAL_COMMUNITY): Payer: Self-pay | Admitting: Student

## 2021-04-05 ENCOUNTER — Other Ambulatory Visit: Payer: Self-pay

## 2021-04-05 ENCOUNTER — Emergency Department (HOSPITAL_COMMUNITY): Payer: Medicaid Other

## 2021-04-05 ENCOUNTER — Emergency Department (HOSPITAL_COMMUNITY)
Admission: EM | Admit: 2021-04-05 | Discharge: 2021-04-05 | Disposition: A | Discharge status: Home / Self Care | Payer: Medicaid Other | Attending: Emergency Medicine | Admitting: Emergency Medicine

## 2021-04-05 DIAGNOSIS — N200 Calculus of kidney: Secondary | ICD-10-CM | POA: Diagnosis not present

## 2021-04-05 DIAGNOSIS — N139 Obstructive and reflux uropathy, unspecified: Secondary | ICD-10-CM | POA: Diagnosis not present

## 2021-04-05 DIAGNOSIS — Z87891 Personal history of nicotine dependence: Secondary | ICD-10-CM | POA: Insufficient documentation

## 2021-04-05 DIAGNOSIS — R109 Unspecified abdominal pain: Secondary | ICD-10-CM | POA: Diagnosis present

## 2021-04-05 DIAGNOSIS — N059 Unspecified nephritic syndrome with unspecified morphologic changes: Secondary | ICD-10-CM | POA: Diagnosis not present

## 2021-04-05 DIAGNOSIS — R319 Hematuria, unspecified: Secondary | ICD-10-CM | POA: Diagnosis not present

## 2021-04-05 DIAGNOSIS — I878 Other specified disorders of veins: Secondary | ICD-10-CM | POA: Diagnosis not present

## 2021-04-05 DIAGNOSIS — J45909 Unspecified asthma, uncomplicated: Secondary | ICD-10-CM | POA: Diagnosis not present

## 2021-04-05 DIAGNOSIS — N2881 Hypertrophy of kidney: Secondary | ICD-10-CM | POA: Diagnosis not present

## 2021-04-05 DIAGNOSIS — R000 Tachycardia, unspecified: Secondary | ICD-10-CM | POA: Diagnosis not present

## 2021-04-05 LAB — CBC WITH DIFFERENTIAL/PLATELET
Abs Immature Granulocytes: 0.02 10*3/uL (ref 0.00–0.07)
Basophils Absolute: 0 10*3/uL (ref 0.0–0.1)
Basophils Relative: 0 %
Eosinophils Absolute: 0 10*3/uL (ref 0.0–0.5)
Eosinophils Relative: 0 %
HCT: 33.8 % — ABNORMAL LOW (ref 36.0–46.0)
Hemoglobin: 10.3 g/dL — ABNORMAL LOW (ref 12.0–15.0)
Immature Granulocytes: 0 %
Lymphocytes Relative: 23 %
Lymphs Abs: 1.7 10*3/uL (ref 0.7–4.0)
MCH: 25 pg — ABNORMAL LOW (ref 26.0–34.0)
MCHC: 30.5 g/dL (ref 30.0–36.0)
MCV: 82 fL (ref 80.0–100.0)
Monocytes Absolute: 0.4 10*3/uL (ref 0.1–1.0)
Monocytes Relative: 6 %
Neutro Abs: 5.2 10*3/uL (ref 1.7–7.7)
Neutrophils Relative %: 71 %
Platelets: 217 10*3/uL (ref 150–400)
RBC: 4.12 MIL/uL (ref 3.87–5.11)
RDW: 16.9 % — ABNORMAL HIGH (ref 11.5–15.5)
WBC: 7.3 10*3/uL (ref 4.0–10.5)
nRBC: 0 % (ref 0.0–0.2)

## 2021-04-05 LAB — COMPREHENSIVE METABOLIC PANEL
ALT: 21 U/L (ref 0–44)
AST: 23 U/L (ref 15–41)
Albumin: 3.6 g/dL (ref 3.5–5.0)
Alkaline Phosphatase: 77 U/L (ref 38–126)
Anion gap: 9 (ref 5–15)
BUN: 10 mg/dL (ref 6–20)
CO2: 26 mmol/L (ref 22–32)
Calcium: 8.8 mg/dL — ABNORMAL LOW (ref 8.9–10.3)
Chloride: 107 mmol/L (ref 98–111)
Creatinine, Ser: 0.98 mg/dL (ref 0.44–1.00)
GFR, Estimated: >60 mL/min (ref >60)
Glucose, Bld: 104 mg/dL — ABNORMAL HIGH (ref 70–99)
Potassium: 3.9 mmol/L (ref 3.5–5.1)
Sodium: 142 mmol/L (ref 135–145)
Total Bilirubin: 0.2 mg/dL — ABNORMAL LOW (ref 0.3–1.2)
Total Protein: 6.8 g/dL (ref 6.5–8.1)

## 2021-04-05 LAB — URINALYSIS, ROUTINE W REFLEX MICROSCOPIC
Bilirubin Urine: NEGATIVE
Glucose, UA: NEGATIVE mg/dL
Ketones, ur: NEGATIVE mg/dL
Leukocytes,Ua: NEGATIVE
Nitrite: NEGATIVE
Protein, ur: NEGATIVE mg/dL
RBC / HPF: >50 RBC/hpf — ABNORMAL HIGH (ref 0–5)
Specific Gravity, Urine: 1.02 (ref 1.005–1.030)
pH: 9 — ABNORMAL HIGH (ref 5.0–8.0)

## 2021-04-05 LAB — I-STAT BETA HCG BLOOD, ED (MC, WL, AP ONLY): I-stat hCG, quantitative: <5 m[IU]/mL (ref <5)

## 2021-04-05 LAB — LIPASE, BLOOD: Lipase: 34 U/L (ref 11–51)

## 2021-04-05 MED ORDER — ONDANSETRON HCL 4 MG/2ML IJ SOLN: 4.0000 mg | Freq: Once | INTRAMUSCULAR | Status: DC

## 2021-04-05 MED ORDER — KETOROLAC TROMETHAMINE 15 MG/ML IJ SOLN
15.0000 mg | Freq: Once | INTRAMUSCULAR | Status: AC
  Administered 2021-04-05: 15 mg via INTRAVENOUS
  Filled 2021-04-05: qty 1

## 2021-04-05 MED ORDER — ONDANSETRON 4 MG PO TBDP: 4.0000 mg | ORAL_TABLET | Freq: Three times a day (TID) | ORAL | 0 refills | Status: DC | PRN

## 2021-04-05 MED ORDER — IBUPROFEN 800 MG PO TABS: 800.0000 mg | ORAL_TABLET | Freq: Three times a day (TID) | ORAL | 0 refills | Status: DC

## 2021-04-05 MED ORDER — ONDANSETRON HCL 4 MG/2ML IJ SOLN
4.0000 mg | Freq: Once | INTRAMUSCULAR | Status: AC
  Administered 2021-04-05: 4 mg via INTRAVENOUS
  Filled 2021-04-05: qty 2

## 2021-04-05 MED ORDER — FENTANYL CITRATE (PF) 100 MCG/2ML IJ SOLN
50.0000 ug | INTRAMUSCULAR | Status: AC | PRN
  Administered 2021-04-05 (×3): 50 ug via INTRAVENOUS
  Filled 2021-04-05 (×3): qty 2

## 2021-04-05 MED ORDER — SODIUM CHLORIDE 0.9 % IV BOLUS
1000.0000 mL | Freq: Once | INTRAVENOUS | Status: AC
  Administered 2021-04-05: 1000 mL via INTRAVENOUS

## 2021-04-05 NOTE — ED Notes (Signed)
Pt verbalizes understanding of discharge instructions. Opportunity for questions and answers were provided. Pt discharged from the ED.   ?

## 2021-04-05 NOTE — ED Notes (Signed)
Patient transported to CT 

## 2021-04-05 NOTE — ED Triage Notes (Signed)
"  Right flank pain started about 6 hours ago, blood in urine x 1" per pt

## 2021-04-05 NOTE — Discharge Instructions (Addendum)
You were seen in the emergency department and found to have a kidney stone.  We are sending you home with multiple medications to assist with passing the stone:   -Ibuprofen 800 mg- this is a medication that will help with pain as well as passing the stone.  Please take this every 8 hours as needed for pain.  Take this with food as it can cause stomach upset and at worst stomach bleeding.  Do not take other NSAIDs such as Motrin, Aleve, Advil, Mobic, or Naproxen with this medicine as they are similar and would propagate any potential side effects.   -Zofran-this is an antinausea medication, you may take this every 8 hours as needed for nausea and vomiting, please allow the tablet to dissolve underneath of your tongue.   We have prescribed you new medication(s) today. Discuss the medications prescribed today with your pharmacist as they can have adverse effects and interactions with your other medicines including over the counter and prescribed medications as well as with breast feeding. Seek medical evaluation if you start to experience new or abnormal symptoms after taking one of these medicines, seek care immediately if you start to experience difficulty breathing, feeling of your throat closing, facial swelling, or rash as these could be indications of a more serious allergic reaction  Please wait 6 hours from discharge to resume breast feeding due to the medicines you received here in the ED.   Please call your OBGYN to make them aware of your ER visit today.  Please follow-up with the urology group provided in your discharge instructions within 3 to 5 days.  Return to the ER for new or worsening symptoms including but not limited to worsening pain not controlled by these medicines, inability to keep fluids down, fever, or any other concerns that you may have.

## 2021-04-05 NOTE — ED Provider Notes (Signed)
[facility] EMERGENCY DEPARTMENT Provider Note   CSN: [id number] Arrival date & time: 04/05/21  0734     History Chief Complaint  Patient presents with   Abdominal Pain    Right flank pain    Carmen [name] is a 30 y.o. female with a hx of asthma, anemia, and prior PE who presents to the ED with complaints of abdominal pain x 6 hours. Pain is located in the right flank, radiates to the generalized abdomen, it is severe. No alleviating/aggravating factors. Associated nausea & vomiting. Somewhat similar pain on the contralateral side a few days ago that spontaneously resolved. S/p vaginal delivery 01/15/21, she is breast feeding. She denies fever, chills, hematemesis, dysuria, vaginal bleeding, vaginal discharge, or diarrhea.   HPI     Past Medical History:  Diagnosis Date   Anemia    Asthma    Panic attacks    Pulmonary embolism, antepartum 02/08/2018   Indications: PE diagnosed 5/2@ [facility].  Left side, lower lobe;   Admitted 5/2-5/6 - heparin drip>lovenox 60BID.  ED evaluation 5/8 2/2 persistent SOB - negative DVT, nml ECG Anticoagulation goal is therapeutic  Therapeutic dose is Lovenox 1mg /kg SQ Q12--currently on 60 mg BID  Monitoring is indicated for patients on therapeutic anticoagulation  Will check Anti-Xa level 4-6 hours after 3rd dose Q trime    Patient Active Problem List   Diagnosis Date Noted   Vaginal delivery 01/15/2021   Second degree perineal laceration 01/15/2021   IUGR (intrauterine growth restriction) affecting care of mother, third trimester, fetus 1 01/14/2021   IUGR (intrauterine growth restriction) affecting care of mother 11/08/2020   Unwanted fertility 11/08/2020   Marijuana abuse 08/12/2020   Alpha thalassemia silent carrier 08/09/2020   Supervision of high risk pregnancy, antepartum 06/28/2020   Ptyalism 06/16/2020   Hyperemesis affecting pregnancy, antepartum 06/14/2020   History of pulmonary embolus during previous pregnancy in 2019  08/27/2018    Past Surgical History:  Procedure Laterality Date   NO PAST SURGERIES       OB History     Gravida  2   Para  1   Term  1   Preterm  0   AB  0   Living  1      SAB  0   IAB  0   Ectopic  0   Multiple  0   Live Births  1           Family History  Problem Relation Age of Onset   Pulmonary embolism Mother    Ulcers Sister     Social History   Tobacco Use   Smoking status: Former    Pack years: 0.00    Types: Cigarettes   Smokeless tobacco: Never  Vaping Use   Vaping Use: Never used  Substance Use Topics   Alcohol use: Not Currently   Drug use: No    Home Medications Prior to Admission medications   Medication Sig Start Date End Date Taking? Authorizing Provider  albuterol (VENTOLIN HFA) 108 (90 Base) MCG/ACT inhaler Inhale 1-2 puffs into the lungs every 6 (six) hours as needed for wheezing or shortness of breath. 05/13/20   07/13/20, MD  enoxaparin (LOVENOX) 40 MG/0.4ML injection Inject 0.4 mLs (40 mg total) into the skin daily. 01/17/21   03/19/21, CNM  ferrous sulfate 325 (65 FE) MG tablet Take 1 tablet (325 mg total) by mouth daily with breakfast. 11/16/20   01/14/21, MD  norethindrone (  MICRONOR) 0.35 MG tablet Take 1 tablet (0.35 mg total) by mouth daily. 03/04/21   Leftwich-Kirby, Wilmer FloorLisa A, CNM  Prenatal Vit-Fe Fumarate-FA (PRENATAL MULTIVITAMIN) TABS tablet Take 1 tablet by mouth daily at 12 noon.    [provider]    Allergies    Morphine and related  Review of Systems   Review of Systems  Constitutional:  Negative for chills and fever.  Respiratory:  Negative for shortness of breath.   Cardiovascular:  Negative for chest pain.  Gastrointestinal:  Positive for abdominal pain, nausea and vomiting. Negative for blood in stool and diarrhea.  Genitourinary:  Positive for flank pain. Negative for dysuria, vaginal bleeding and vaginal discharge.  Neurological:  Negative for syncope.  All other  systems reviewed and are negative.  Physical Exam Updated Vital Signs BP 140/82   Pulse 95   Temp 97.9 F (36.6 C) (Oral)   Ht 4\' 11"  (1.499 m)   Wt 59 kg   SpO2 98%   Breastfeeding Yes   BMI 26.26 kg/m   Physical Exam Vitals and nursing note reviewed.  Constitutional:      General: She is in acute distress (appears uncomfortable).     Appearance: She is not toxic-appearing.  HENT:     Head: Normocephalic and atraumatic.  Eyes:     General:        Right eye: No discharge.        Left eye: No discharge.     Pupils: Pupils are equal, round, and reactive to light.  Cardiovascular:     Rate and Rhythm: Normal rate and regular rhythm.  Pulmonary:     Effort: Pulmonary effort is normal.     Breath sounds: Normal breath sounds.  Abdominal:     Tenderness: There is generalized abdominal tenderness. There is right CVA tenderness. There is no left CVA tenderness, guarding or rebound.  Musculoskeletal:     Cervical back: Neck supple.  Skin:    General: Skin is warm and dry.  Neurological:     Mental Status: She is alert.     Comments: Alert, clear speech.   Psychiatric:        Thought Content: Thought content normal.    ED Results / Procedures / Treatments   Labs (all labs ordered are listed, but only abnormal results are displayed) Labs Reviewed  COMPREHENSIVE METABOLIC PANEL  CBC WITH DIFFERENTIAL/PLATELET  LIPASE, BLOOD  URINALYSIS, ROUTINE W REFLEX MICROSCOPIC  I-STAT BETA HCG BLOOD, ED ([facility], [facility], [facility] ONLY)    EKG None  Radiology CT Renal Stone Study  Result Date: 04/05/2021 CLINICAL DATA:  30 year old female with right flank pain and hematuria onset this morning. EXAM: CT ABDOMEN AND PELVIS WITHOUT CONTRAST TECHNIQUE: Multidetector CT imaging of the abdomen and pelvis was performed following the standard protocol without IV contrast. COMPARISON:  None. FINDINGS: Lower chest: Mild atelectasis in the left costophrenic angle. Otherwise negative. Hepatobiliary:  Negative noncontrast liver and gallbladder. Pancreas: Negative. Spleen: Negative. Adrenals/Urinary Tract: Normal adrenal glands. Multifocal mostly punctate bilateral nephrolithiasis. Asymmetric enlargement of the right renal collecting system and indistinct appearance of the right kidney (series 3, image 27) with mild right perinephric inflammation. The proximal right ureter is also asymmetric and appears mildly inflamed. Both ureters are difficult to delineate. Along the course of the right ureter there is a 2-3 mm calculus at the expected location of the right ureterovesical junction on series 3, image 69. But otherwise the bladder is decompressed and negative. Stomach/Bowel: No dilated large  or small bowel in the abdomen or pelvis. Normal appendix visible on coronal image 34 tracking posteriorly into the pelvis. Unremarkable terminal ileum. No abdominal free air or free fluid. Vascular/Lymphatic: Normal caliber abdominal aorta. No calcified atherosclerosis. No lymphadenopathy is evident. Reproductive: Negative noncontrast appearance. Other: Trace low-density free fluid in the cul-de-sac. Multiple bilateral pelvic phleboliths. Musculoskeletal: Negative. IMPRESSION: Bilateral nephrolithiasis with evidence of acute obstructive uropathy on the right. The ureters are difficult to delineate, but a 2-3 mm calculus is at the expected location of the right UVJ. Electronically Signed   By: Odessa Fleming M.D.   On: 04/05/2021 09:52    Procedures Procedures   Medications Ordered in ED Medications  fentaNYL (SUBLIMAZE) injection 50 mcg (has no administration in time range)  ondansetron (ZOFRAN) injection 4 mg (has no administration in time range)    ED Course  I have reviewed the triage vital signs and the nursing notes.  Pertinent labs & imaging results that were available during my care of the patient were reviewed by me and considered in my medical decision making (see chart for details).    MDM  Rules/Calculators/A&P                          Patient presents to the ED with complaints of flank/abdominal pain x 6 hours.  Nontoxic, appears uncomfortable, vitals WNL, R CVA tenderness with generalized abdominal tenderness. Zofran ordered for N/V (discussed w/ pharmacist) and PRN fentanyl ordered for pain- has tolerated previously, has allergy to morphine/dilaudid, did receive prior to arrival as well.   Additional history obtained:  Additional history obtained from chart review & nursing note review.  Vaginal delivery 01/15/21  Lab Tests:  I Ordered, reviewed, and interpreted labs, which included:  CBC: Mild anemia improved from most recent on record.  CMP: mild hypocalcemia, no significant electrolyte derangement.  Lipase: WNL Preg test: Negative UA; Hematuria- no UTI.   Imaging Studies ordered:  I ordered imaging studies which included CT renal stone study, I independently reviewed, formal radiology impression shows:  Bilateral nephrolithiasis with evidence of acute obstructive uropathy on the right. The ureters are difficult to delineate, but a 2-3 mm calculus is at the expected location of the right UVJ.  ED Course:  Likely symptomatic from right UVJ obstructive uropathy.  Renal function preserved, UA without UTI.   Fentanyl given without relief of pain, therefore toradol ordered.   11:20: RE-EVAL: Patient woken from sleep, states pain is improved to an 8/10 in severity, given gingerale for PO challenge, will redose 15 mg of IV toradol.   12:40: RE-EVAL: Patient is tolerating PO she is feeling much better, she is ready to go home.   I discussed medications patient received in the ED as well as discharge medications with [facility] Loleta Dicker- will have patient hold off on breast feeding for 6 hours post discharge and send home with ibuprofen & zofran to take as needed for pain/nausea per our discussion. I discussed some limitation in research regarding zofran in breast feeding  with the patient. Narcotics not provided given patient's allergy. Will have her follow up closely with urology with strict ED return precautions. I discussed results, treatment plan, need for follow-up, and return precautions with the patient. Provided opportunity for questions, patient confirmed understanding and is in agreement with plan.   Findings and plan of care discussed with supervising physician Dr. Lynelle Doctor who is in agreement.   Portions of this note were generated with  Scientist, clinical (histocompatibility and immunogenetics). Dictation errors may occur despite best attempts at proofreading.  Final Clinical Impression(s) / ED Diagnoses Final diagnoses:  Kidney stone    Rx / DC Orders ED Discharge Orders          Ordered    ondansetron (ZOFRAN ODT) 4 MG disintegrating tablet  Every 8 hours PRN,   Status:  Discontinued        04/05/21 1245    ibuprofen (ADVIL) 800 MG tablet  3 times daily,   Status:  Discontinued        04/05/21 1245    ibuprofen (ADVIL) 800 MG tablet  3 times daily        04/05/21 1249    ondansetron (ZOFRAN ODT) 4 MG disintegrating tablet  Every 8 hours PRN        04/05/21 1249             Kazimierz Springborn, Pleas Koch, PA-C 04/05/21 1259    Linwood Dibbles, MD 04/06/21 1422

## 2021-04-06 ENCOUNTER — Ambulatory Visit: Payer: Medicaid Other | Admitting: Clinical

## 2021-04-06 DIAGNOSIS — Z91199 Patient's noncompliance with other medical treatment and regimen due to unspecified reason: Secondary | ICD-10-CM

## 2021-04-26 ENCOUNTER — Telehealth: Payer: Self-pay | Admitting: Clinical

## 2021-04-26 NOTE — Telephone Encounter (Signed)
Attempt to schedule patient to see [facility]; Left HIPPA-compliant message to call back Asher Muir from [employer] for [employer] at [facility] at  [phone] [facility] office). Pt has both direct number to call [phone]) and front office number [phone]).

## 2021-04-29 DIAGNOSIS — Z1329 Encounter for screening for other suspected endocrine disorder: Secondary | ICD-10-CM | POA: Diagnosis not present

## 2021-04-29 DIAGNOSIS — Z130 Encounter for screening for diseases of the blood and blood-forming organs and certain disorders involving the immune mechanism: Secondary | ICD-10-CM | POA: Diagnosis not present

## 2021-04-29 DIAGNOSIS — Z131 Encounter for screening for diabetes mellitus: Secondary | ICD-10-CM | POA: Diagnosis not present

## 2021-04-29 DIAGNOSIS — Z0189 Encounter for other specified special examinations: Secondary | ICD-10-CM | POA: Diagnosis not present

## 2021-04-29 DIAGNOSIS — Z13220 Encounter for screening for lipoid disorders: Secondary | ICD-10-CM | POA: Diagnosis not present

## 2021-07-02 ENCOUNTER — Encounter (HOSPITAL_COMMUNITY): Payer: Self-pay | Admitting: Emergency Medicine

## 2021-07-02 ENCOUNTER — Other Ambulatory Visit: Payer: Self-pay

## 2021-07-02 ENCOUNTER — Emergency Department (HOSPITAL_COMMUNITY)
Admission: EM | Admit: 2021-07-02 | Discharge: 2021-07-03 | Disposition: A | Discharge status: Home / Self Care | Payer: Medicaid Other | Attending: Emergency Medicine | Admitting: Emergency Medicine

## 2021-07-02 DIAGNOSIS — Z87891 Personal history of nicotine dependence: Secondary | ICD-10-CM | POA: Diagnosis not present

## 2021-07-02 DIAGNOSIS — R0602 Shortness of breath: Secondary | ICD-10-CM | POA: Diagnosis not present

## 2021-07-02 DIAGNOSIS — U071 COVID-19: Secondary | ICD-10-CM | POA: Insufficient documentation

## 2021-07-02 DIAGNOSIS — Z28310 Unvaccinated for covid-19: Secondary | ICD-10-CM | POA: Insufficient documentation

## 2021-07-02 DIAGNOSIS — R059 Cough, unspecified: Secondary | ICD-10-CM | POA: Diagnosis present

## 2021-07-02 LAB — CBC
HCT: 36.4 % (ref 36.0–46.0)
Hemoglobin: 11.4 g/dL — ABNORMAL LOW (ref 12.0–15.0)
MCH: 26.9 pg (ref 26.0–34.0)
MCHC: 31.3 g/dL (ref 30.0–36.0)
MCV: 85.8 fL (ref 80.0–100.0)
Platelets: 176 10*3/uL (ref 150–400)
RBC: 4.24 MIL/uL (ref 3.87–5.11)
RDW: 14.7 % (ref 11.5–15.5)
WBC: 4.8 10*3/uL (ref 4.0–10.5)
nRBC: 0 % (ref 0.0–0.2)

## 2021-07-02 LAB — COMPREHENSIVE METABOLIC PANEL
ALT: 13 U/L (ref 0–44)
AST: 18 U/L (ref 15–41)
Albumin: 3.7 g/dL (ref 3.5–5.0)
Alkaline Phosphatase: 82 U/L (ref 38–126)
Anion gap: 8 (ref 5–15)
BUN: 6 mg/dL (ref 6–20)
CO2: 23 mmol/L (ref 22–32)
Calcium: 8.9 mg/dL (ref 8.9–10.3)
Chloride: 103 mmol/L (ref 98–111)
Creatinine, Ser: 0.85 mg/dL (ref 0.44–1.00)
GFR, Estimated: >60 mL/min (ref >60)
Glucose, Bld: 98 mg/dL (ref 70–99)
Potassium: 3.7 mmol/L (ref 3.5–5.1)
Sodium: 134 mmol/L — ABNORMAL LOW (ref 135–145)
Total Bilirubin: 0.7 mg/dL (ref 0.3–1.2)
Total Protein: 7.2 g/dL (ref 6.5–8.1)

## 2021-07-02 LAB — URINALYSIS, ROUTINE W REFLEX MICROSCOPIC
Bilirubin Urine: NEGATIVE
Glucose, UA: NEGATIVE mg/dL
Hgb urine dipstick: NEGATIVE
Ketones, ur: 5 mg/dL — AB
Leukocytes,Ua: NEGATIVE
Nitrite: NEGATIVE
Protein, ur: NEGATIVE mg/dL
Specific Gravity, Urine: 1.019 (ref 1.005–1.030)
pH: 9 — ABNORMAL HIGH (ref 5.0–8.0)

## 2021-07-02 LAB — I-STAT BETA HCG BLOOD, ED (MC, WL, AP ONLY): I-stat hCG, quantitative: <5 m[IU]/mL (ref <5)

## 2021-07-02 LAB — LIPASE, BLOOD: Lipase: 25 U/L (ref 11–51)

## 2021-07-02 MED ORDER — KETOROLAC TROMETHAMINE 30 MG/ML IJ SOLN
30.0000 mg | Freq: Once | INTRAMUSCULAR | Status: AC
  Administered 2021-07-03: 30 mg via INTRAVENOUS
  Filled 2021-07-02: qty 1

## 2021-07-02 MED ORDER — LACTATED RINGERS IV BOLUS
1000.0000 mL | Freq: Once | INTRAVENOUS | Status: AC
  Administered 2021-07-03: 1000 mL via INTRAVENOUS

## 2021-07-02 MED ORDER — ONDANSETRON HCL 4 MG/2ML IJ SOLN
4.0000 mg | Freq: Once | INTRAMUSCULAR | Status: AC
  Administered 2021-07-03: 4 mg via INTRAVENOUS
  Filled 2021-07-02: qty 2

## 2021-07-02 MED ORDER — ACETAMINOPHEN 325 MG PO TABS
650.0000 mg | ORAL_TABLET | Freq: Once | ORAL | Status: AC | PRN
  Administered 2021-07-02: 650 mg via ORAL
  Filled 2021-07-02: qty 2

## 2021-07-02 NOTE — ED Triage Notes (Signed)
Pt c/o sore throat, chills, generalized body aches, weakness and nausea/vomiting x 1 day.

## 2021-07-03 LAB — D-DIMER, QUANTITATIVE: D-Dimer, Quant: 1.92 ug/mL-FEU — ABNORMAL HIGH (ref 0.00–0.50)

## 2021-07-03 LAB — RESP PANEL BY RT-PCR (FLU A&B, COVID) ARPGX2
Influenza A by PCR: NEGATIVE
Influenza B by PCR: NEGATIVE
SARS Coronavirus 2 by RT PCR: POSITIVE — AB

## 2021-07-03 NOTE — Discharge Instructions (Addendum)
Be sure to pump and dump for the next time you are ready to breast-feed.

## 2021-07-03 NOTE — ED Provider Notes (Signed)
[facility] EMERGENCY DEPARTMENT Provider Note   CSN: [id number] Arrival date & time: 07/02/21  1746     History Chief Complaint  Patient presents with   Emesis    Carmen [name] is a 30 y.o. female.  The history is provided by the patient.  Cough Cough characteristics:  Non-productive Severity:  Mild Onset quality:  Gradual Duration:  1 day Timing:  Intermittent Progression:  Unchanged Chronicity:  New Relieved by:  Nothing Worsened by:  Nothing Associated symptoms: chest pain, chills, fever, headaches and myalgias   Associated symptoms: no shortness of breath and no sore throat   Patient reports that over the past day she has had cough.  She also reports sharp chest pain that comes and goes at times.  No sore throat, no vomiting but reports nausea.  She also reports headaches and body aches.  She reports chills. She is not vaccinated for COVID.  No recent long distance travel. Patient reports previous history of PE while pregnant, but no longer on anticoagulation. Pain does not feel like previous PE.    Past Medical History:  Diagnosis Date   Anemia    Asthma    Panic attacks    Pulmonary embolism, antepartum 02/08/2018   Indications: PE diagnosed 5/2@ [facility].  Left side, lower lobe;   Admitted 5/2-5/6 - heparin drip>lovenox 60BID.  ED evaluation 5/8 2/2 persistent SOB - negative DVT, nml ECG Anticoagulation goal is therapeutic  Therapeutic dose is Lovenox 1mg /kg SQ Q12--currently on 60 mg BID  Monitoring is indicated for patients on therapeutic anticoagulation  Will check Anti-Xa level 4-6 hours after 3rd dose Q trime    Patient Active Problem List   Diagnosis Date Noted   Vaginal delivery 01/15/2021   Second degree perineal laceration 01/15/2021   IUGR (intrauterine growth restriction) affecting care of mother, third trimester, fetus 1 01/14/2021   IUGR (intrauterine growth restriction) affecting care of mother 11/08/2020   Unwanted fertility  11/08/2020   Marijuana abuse 08/12/2020   Alpha thalassemia silent carrier 08/09/2020   Supervision of high risk pregnancy, antepartum 06/28/2020   Ptyalism 06/16/2020   Hyperemesis affecting pregnancy, antepartum 06/14/2020   History of pulmonary embolus during previous pregnancy in 2019 08/27/2018    Past Surgical History:  Procedure Laterality Date   NO PAST SURGERIES       OB History     Gravida  2   Para  1   Term  1   Preterm  0   AB  0   Living  1      SAB  0   IAB  0   Ectopic  0   Multiple  0   Live Births  1           Family History  Problem Relation Age of Onset   Pulmonary embolism Mother    Ulcers Sister     Social History   Tobacco Use   Smoking status: Former    Types: Cigarettes   Smokeless tobacco: Never  Vaping Use   Vaping Use: Never used  Substance Use Topics   Alcohol use: Not Currently   Drug use: No    Home Medications Prior to Admission medications   Medication Sig Start Date End Date Taking? Authorizing Provider  ibuprofen (ADVIL) 800 MG tablet Take 1 tablet (800 mg total) by mouth 3 (three) times daily. 04/05/21   Petrucelli, Samantha R, PA-C  ondansetron (ZOFRAN ODT) 4 MG disintegrating tablet Take 1 tablet (4  mg total) by mouth every 8 (eight) hours as needed for nausea or vomiting. 04/05/21   Petrucelli, Lelon Mast R, PA-C    Allergies    Morphine and related  Review of Systems   Review of Systems  Constitutional:  Positive for chills, fatigue and fever.  HENT:  Negative for sore throat.   Respiratory:  Positive for cough. Negative for shortness of breath.   Cardiovascular:  Positive for chest pain.  Gastrointestinal:  Positive for abdominal pain and nausea. Negative for diarrhea and vomiting.  Musculoskeletal:  Positive for back pain and myalgias. Negative for neck stiffness.  Neurological:  Positive for headaches.  All other systems reviewed and are negative.  Physical Exam Updated Vital Signs BP 116/76    Pulse 93   Temp 98.9 F (37.2 C)   Resp 16   SpO2 100%   Physical Exam CONSTITUTIONAL: Well developed/well nourished HEAD: Normocephalic/atraumatic EYES: EOMI/PERRL ENMT: Mucous membranes moist, uvula midline without erythema NECK: supple no meningeal signs SPINE/BACK:entire spine nontender CV: S1/S2 noted, no murmurs/rubs/gallops noted LUNGS: Lungs are clear to auscultation bilaterally, no apparent distress ABDOMEN: soft, mild epigastric tenderness, no rebound or guarding, bowel sounds noted throughout abdomen GU:no cva tenderness NEURO: Pt is awake/alert/appropriate, moves all extremitiesx4.  No facial droop.   EXTREMITIES: pulses normal/equal, full ROM, no calf tenderness or edema SKIN: warm, color normal PSYCH: no abnormalities of mood noted, alert and oriented to situation  ED Results / Procedures / Treatments   Labs (all labs ordered are listed, but only abnormal results are displayed) Labs Reviewed  RESP PANEL BY RT-PCR (FLU A&B, COVID) ARPGX2 - Abnormal; Notable for the following components:      Result Value   SARS Coronavirus 2 by RT PCR POSITIVE (*)    All other components within normal limits  COMPREHENSIVE METABOLIC PANEL - Abnormal; Notable for the following components:   Sodium 134 (*)    All other components within normal limits  CBC - Abnormal; Notable for the following components:   Hemoglobin 11.4 (*)    All other components within normal limits  URINALYSIS, ROUTINE W REFLEX MICROSCOPIC - Abnormal; Notable for the following components:   pH 9.0 (*)    Ketones, ur 5 (*)    All other components within normal limits  D-DIMER, QUANTITATIVE - Abnormal; Notable for the following components:   D-Dimer, Quant 1.92 (*)    All other components within normal limits  LIPASE, BLOOD  I-STAT BETA HCG BLOOD, ED ([facility], [facility], [facility] ONLY)    EKG EKG Interpretation  Date/Time:  Sunday July 03 2021 00:02:24 EDT Ventricular Rate:  92 PR Interval:  109 QRS  Duration: 84 QT Interval:  330 QTC Calculation: 409 R Axis:   1 Text Interpretation: Sinus rhythm Short PR interval Borderline repolarization abnormality Confirmed by Zadie Rhine ([id number]) on 07/03/2021 12:06:08 AM  Radiology No results found.  Procedures Procedures   Medications Ordered in ED Medications  acetaminophen (TYLENOL) tablet 650 mg (650 mg Oral Given 07/02/21 1801)  lactated ringers bolus 1,000 mL (0 mLs Intravenous Stopped 07/03/21 0152)  ondansetron (ZOFRAN) injection 4 mg (4 mg Intravenous Given 07/03/21 0029)  ketorolac (TORADOL) 30 MG/ML injection 30 mg (30 mg Intravenous Given 07/03/21 0029)    ED Course  I have reviewed the triage vital signs and the nursing notes.  Pertinent labs & imaging results that were available during my care of the patient were reviewed by me and considered in my medical decision making (see chart for details).  MDM Rules/Calculators/A&P                           Patient presents with fever, body aches, headache and cough.  Patient also reports chest pain. Differential includes COVID-19, influenza, pneumonia as well as pulmonary embolism. Patient declines chest x-ray at this time.  Her lung sounds are clear and there is no hypoxia. However will proceed with COVID-19 and flu testing as well as a D-dimer. Patient agreeable with plan. Patient overall nontoxic in appearance.  She has no meningeal signs and her mental status is appropriate Patient reports she is breast-feeding, she was advised to pump and dump after leaving the hospital Patient denies any symptoms of mastitis 1:59 AM Patient feels improved.  Patient is COVID-19 positive Patient did have elevated D-dimer, but this could be related to her COVID diagnosis and she has had an elevated D-dimer previously.  Patient declines further work-up and specifically declines CT chest.  I advised her that I cannot fully rule out a pulmonary embolism without a CT chest and she understands  this.  Her vital signs have  improved BP 116/76   Pulse 93   Temp 98.9 F (37.2 C)   Resp 16   SpO2 100%  No hypoxia or tachycardia I suspect most of her symptoms are due to the underlying COVID-19.  She is unvaccinated but overall healthy at baseline. She is safe for discharge home. We discussed return precautions including increasing chest pain, increasing shortness of breath or any hemoptysis over the next 2-3 days.  I advised her to call [phone] if this occurs Final Clinical Impression(s) / ED Diagnoses Final diagnoses:  COVID-19    Rx / DC Orders ED Discharge Orders     None        Zadie Rhine, MD 07/03/21 0200

## 2021-07-04 ENCOUNTER — Telehealth: Payer: Self-pay

## 2021-07-04 NOTE — Telephone Encounter (Signed)
Transition Care Management Unsuccessful Follow-up Telephone Call  Date of discharge and from where:  07/03/2021[facility]  Attempts:  1st Attempt  Reason for unsuccessful TCM follow-up call:  Left voice message

## 2021-07-11 NOTE — Telephone Encounter (Signed)
Transition Care Management Unsuccessful Follow-up Telephone Call  Date of discharge and from where:  07/03/2021 from [facility]  Attempts:  2nd Attempt  Reason for unsuccessful TCM follow-up call:  Left voice message

## 2021-07-12 NOTE — Telephone Encounter (Signed)
Transition Care Management Unsuccessful Follow-up Telephone Call  Date of discharge and from where:  07/03/2021 from [facility] ED  Attempts:  3rd Attempt  Reason for unsuccessful TCM follow-up call:  Unable to reach patient

## 2021-08-16 ENCOUNTER — Encounter (HOSPITAL_COMMUNITY): Payer: Self-pay | Admitting: Emergency Medicine

## 2021-08-16 ENCOUNTER — Other Ambulatory Visit: Payer: Self-pay

## 2021-08-16 ENCOUNTER — Ambulatory Visit (HOSPITAL_COMMUNITY)
Admission: EM | Admit: 2021-08-16 | Discharge: 2021-08-16 | Disposition: A | Discharge status: Home / Self Care | Payer: Medicaid Other | Attending: Emergency Medicine | Admitting: Emergency Medicine

## 2021-08-16 DIAGNOSIS — B349 Viral infection, unspecified: Secondary | ICD-10-CM

## 2021-08-16 MED ORDER — LIDOCAINE VISCOUS HCL 2 % MT SOLN: 15.0000 mL | OROMUCOSAL | 0 refills | Status: DC | PRN

## 2021-08-16 MED ORDER — ALBUTEROL SULFATE HFA 108 (90 BASE) MCG/ACT IN AERS: 2.0000 | INHALATION_SPRAY | RESPIRATORY_TRACT | 0 refills | Status: DC | PRN

## 2021-08-16 MED ORDER — OLOPATADINE HCL 0.1 % OP SOLN: 1.0000 [drp] | Freq: Two times a day (BID) | OPHTHALMIC | 12 refills | Status: DC

## 2021-08-16 NOTE — ED Provider Notes (Signed)
[facility]    CSN: [id number] Arrival date & time: 08/16/21  1006      History   Chief Complaint Chief Complaint  Patient presents with   Sore Throat   Eye Drainage    HPI Carmen [name] is a 30 y.o. female.   Patient presents today with sore throat and left eye discharge with sensation of heaviness for 5 days.  Initially had a fever, nonproductive cough, nasal congestion and rhinorrhea which have lessened.  Child at home sick with similar symptoms.  Has attempted use of over-the-counter remedies which do provide some relief.  History of asthma, PE.  Denies chills, body aches, abdominal pain, nausea, vomiting, diarrhea, wheezing, shortness of breath, headaches, ear pain.  Past Medical History:  Diagnosis Date   Anemia    Asthma    Panic attacks    Pulmonary embolism, antepartum 02/08/2018   Indications: PE diagnosed 5/2@ [facility].  Left side, lower lobe;   Admitted 5/2-5/6 - heparin drip>lovenox 60BID.  ED evaluation 5/8 2/2 persistent SOB - negative DVT, nml ECG Anticoagulation goal is therapeutic  Therapeutic dose is Lovenox 1mg /kg SQ Q12--currently on 60 mg BID  Monitoring is indicated for patients on therapeutic anticoagulation  Will check Anti-Xa level 4-6 hours after 3rd dose Q trime    Patient Active Problem List   Diagnosis Date Noted   Vaginal delivery 01/15/2021   Second degree perineal laceration 01/15/2021   IUGR (intrauterine growth restriction) affecting care of mother, third trimester, fetus 1 01/14/2021   IUGR (intrauterine growth restriction) affecting care of mother 11/08/2020   Unwanted fertility 11/08/2020   Marijuana abuse 08/12/2020   Alpha thalassemia silent carrier 08/09/2020   Supervision of high risk pregnancy, antepartum 06/28/2020   Ptyalism 06/16/2020   Hyperemesis affecting pregnancy, antepartum 06/14/2020   History of pulmonary embolus during previous pregnancy in 2019 08/27/2018    Past Surgical History:  Procedure Laterality Date    NO PAST SURGERIES      OB History     Gravida  2   Para  1   Term  1   Preterm  0   AB  0   Living  1      SAB  0   IAB  0   Ectopic  0   Multiple  0   Live Births  1            Home Medications    Prior to Admission medications   Medication Sig Start Date End Date Taking? Authorizing Provider  ibuprofen (ADVIL) 800 MG tablet Take 1 tablet (800 mg total) by mouth 3 (three) times daily. 04/05/21   Petrucelli, Samantha R, PA-C  ondansetron (ZOFRAN ODT) 4 MG disintegrating tablet Take 1 tablet (4 mg total) by mouth every 8 (eight) hours as needed for nausea or vomiting. 04/05/21   Petrucelli, 04/07/21, PA-C    Family History Family History  Problem Relation Age of Onset   Pulmonary embolism Mother    Ulcers Sister     Social History Social History   Tobacco Use   Smoking status: Former    Types: Cigarettes   Smokeless tobacco: Never  Vaping Use   Vaping Use: Never used  Substance Use Topics   Alcohol use: Not Currently   Drug use: No     Allergies   Morphine and related   Review of Systems Review of Systems  Constitutional:  Negative for activity change, appetite change, chills, diaphoresis, fatigue, fever and unexpected weight  change.  HENT:  Positive for sore throat. Negative for congestion, dental problem, drooling, ear discharge, ear pain, facial swelling, hearing loss, mouth sores, nosebleeds, postnasal drip, rhinorrhea, sinus pressure, sinus pain, sneezing, tinnitus, trouble swallowing and voice change.   Eyes:  Positive for discharge. Negative for photophobia, pain, redness, itching and visual disturbance.  Respiratory:  Positive for cough and chest tightness. Negative for apnea, choking, shortness of breath, wheezing and stridor.   Cardiovascular: Negative.   Skin: Negative.   Neurological: Negative.     Physical Exam Triage Vital Signs ED Triage Vitals  Enc Vitals Group     BP 08/16/21 1133 115/78     Pulse Rate 08/16/21 1133  80     Resp 08/16/21 1133 18     Temp 08/16/21 1133 98.6 F (37 C)     Temp src --      SpO2 08/16/21 1133 96 %     Weight --      Height --      Head Circumference --      Peak Flow --      Pain Score 08/16/21 1132 7     Pain Loc --      Pain Edu? --      Excl. in [place]? --    No data found.  Updated Vital Signs BP 115/78   Pulse 80   Temp 98.6 F (37 C)   Resp 18   SpO2 96%   Breastfeeding Yes   Visual Acuity Right Eye Distance:   Left Eye Distance:   Bilateral Distance:    Right Eye Near:   Left Eye Near:    Bilateral Near:     Physical Exam Constitutional:      Appearance: Normal appearance. She is normal weight.  HENT:     Head: Normocephalic.     Right Ear: Tympanic membrane, ear canal and external ear normal.     Left Ear: Tympanic membrane, ear canal and external ear normal.     Nose: Congestion and rhinorrhea present.     Mouth/Throat:     Mouth: Mucous membranes are moist.     Pharynx: Posterior oropharyngeal erythema present.  Eyes:     Extraocular Movements: Extraocular movements intact.  Cardiovascular:     Rate and Rhythm: Normal rate and regular rhythm.     Pulses: Normal pulses.     Heart sounds: Normal heart sounds.  Pulmonary:     Effort: Pulmonary effort is normal.     Breath sounds: Normal breath sounds.  Lymphadenopathy:     Cervical: No cervical adenopathy.  Skin:    General: Skin is warm and dry.  Neurological:     Mental Status: She is alert and oriented to person, place, and time. Mental status is at baseline.  Psychiatric:        Mood and Affect: Mood normal.        Behavior: Behavior normal.     UC Treatments / Results  Labs (all labs ordered are listed, but only abnormal results are displayed) Labs Reviewed - No data to display  EKG   Radiology No results found.  Procedures Procedures (including critical care time)  Medications Ordered in UC Medications - No data to display  Initial Impression / Assessment  and Plan / UC Course  I have reviewed the triage vital signs and the nursing notes.  Pertinent labs & imaging results that were available during my care of the patient were reviewed by me and considered  in my medical decision making (see chart for details).  Viral illness  Discussed etiology of symptoms, timeline and possible resolution with patient  1.  Albuterol inhaler 108 mcg 2 puffs every 4 hours as needed, patient has history of asthma and has run out of medication 2.  Lidocaine viscous 2% 15 mils every 4 hours as needed 3.  Patanol 0.1% 1 drop left eye twice daily as needed 4.  Over-the-counter medication for remaining symptom management 5.  Work note given 6.  Urgent care follow-up as needed Final Clinical Impressions(s) / UC Diagnoses   Final diagnoses:  None   Discharge Instructions   None    ED Prescriptions   None    PDMP not reviewed this encounter.   Hans Eden, NP 08/16/21 1216

## 2021-08-16 NOTE — ED Triage Notes (Signed)
Pt is present today with sore throat and eye drainage. Pt sx started last Thursday

## 2021-08-16 NOTE — Discharge Instructions (Signed)
Your symptoms are most likely related to a virus and should steadily improve with time  For your eye you may use Patanol eyedrops twice a day to help reduce the swelling and that sensation of heaviness, you can remove any drainage present with a cool compress  For your breathing you may use albuterol inhaler 2 puffs every 4 hours as needed  For your throat you may gargle and spit lidocaine solution every 4 hours as needed, you may attempt salt water gargles, throat lozenges and warm liquids for additional comfort  Maintaining adequate hydration may help to thin secretions and soothe the respiratory mucosa   Warm Liquids- Ingestion of warm liquids may have a soothing effect on the respiratory mucosa, increase the flow of nasal mucus, and loosen respiratory secretions, making them easier to remove  May try honey (2.5 to 5 mL [0.5 to 1 teaspoon]) can be given straight or diluted in liquid (juice). Corn syrup may be substituted if honey is not available.

## 2021-10-24 ENCOUNTER — Ambulatory Visit (HOSPITAL_COMMUNITY)
Admission: EM | Admit: 2021-10-24 | Discharge: 2021-10-24 | Disposition: A | Discharge status: Home / Self Care | Payer: Medicaid Other | Attending: Family Medicine | Admitting: Family Medicine

## 2021-10-24 ENCOUNTER — Encounter (HOSPITAL_COMMUNITY): Payer: Self-pay | Admitting: Emergency Medicine

## 2021-10-24 ENCOUNTER — Ambulatory Visit (INDEPENDENT_AMBULATORY_CARE_PROVIDER_SITE_OTHER): Payer: Medicaid Other

## 2021-10-24 ENCOUNTER — Other Ambulatory Visit: Payer: Self-pay

## 2021-10-24 DIAGNOSIS — S82035A Nondisplaced transverse fracture of left patella, initial encounter for closed fracture: Secondary | ICD-10-CM | POA: Diagnosis not present

## 2021-10-24 DIAGNOSIS — M7989 Other specified soft tissue disorders: Secondary | ICD-10-CM | POA: Diagnosis not present

## 2021-10-24 MED ORDER — HYDROCODONE-ACETAMINOPHEN 5-325 MG PO TABS: 1.0000 | ORAL_TABLET | Freq: Four times a day (QID) | ORAL | 0 refills | Status: DC | PRN

## 2021-10-24 NOTE — Discharge Instructions (Addendum)
If not allergic, you may use over the counter ibuprofen or acetaminophen as needed for discomfort.  Be aware, you have been prescribed pain medications that may cause drowsiness. While taking this medication, do not take any other medications containing acetaminophen (Tylenol). Do not combine with alcohol or other illicit drugs. Please do not drive, operate heavy machinery, or take part in activities that require making important decisions while on this medication as your judgement may be clouded.

## 2021-10-24 NOTE — ED Triage Notes (Signed)
PT fell last night and left knee took all of her weight. PT reports immediate pain and swelling.

## 2021-10-26 DIAGNOSIS — S82045A Nondisplaced comminuted fracture of left patella, initial encounter for closed fracture: Secondary | ICD-10-CM | POA: Diagnosis not present

## 2021-10-26 DIAGNOSIS — M25562 Pain in left knee: Secondary | ICD-10-CM | POA: Diagnosis not present

## 2021-10-26 NOTE — ED Provider Notes (Signed)
[facility]   [id number] 10/24/21 Arrival Time: 0932  ASSESSMENT & PLAN:  1. Closed nondisplaced transverse fracture of left patella, initial encounter    I have personally viewed the imaging studies ordered this visit. Apparent inferior patellar fracture. LLE neuro/vasc intact.  Discharge Medication List as of 10/24/2021 11:30 AM     START taking these medications   Details  HYDROcodone-acetaminophen (NORCO/VICODIN) 5-325 MG tablet Take 1 tablet by mouth every 6 (six) hours as needed for moderate pain or severe pain., Starting Mon 10/24/2021, Normal        Orders Placed This Encounter  Procedures   DG Knee Complete 4 Views Left   Crutches   Apply knee immobilizer  To remain non-weightbearing as able until orthopaedic f/u.  Recommend:  Follow-up Information     Schedule an appointment as soon as possible for a visit  with Ernestina Columbia, MD.   Specialty: Orthopedic Surgery Contact information: [address] [place] [address] [phone]                Work note provided. [place] Controlled Substances Registry consulted for this patient. I feel the risk/benefit ratio today is favorable for proceeding with this prescription for a controlled substance. Medication sedation precautions given.  Reviewed expectations re: course of current medical issues. Questions answered. Outlined signs and symptoms indicating need for more acute intervention. Patient verbalized understanding. After Visit Summary given.  SUBJECTIVE: History from: patient. Carmen [name] is a 31 y.o. female who reports fall last evening; onto anterior L knee; immed pain; continuing today. Able to br wt but with reported pain in knee. With swelling. No extremity sensation changes or weakness. No tx PTA>  Past Surgical History:  Procedure Laterality Date   NO PAST SURGERIES        OBJECTIVE:  Vitals:   10/24/21 1002  BP: 114/71  Pulse: 82  Resp: 16  Temp: 98.2 F (36.8  C)  TempSrc: Oral  SpO2: 97%    General appearance: alert; no distress HEENT: [place]; AT Neck: supple with FROM Resp: unlabored respirations Extremities: LLE: warm with well perfused appearance; fairly well localized moderate tenderness over left anterior knee/patella; without gross deformities; swelling: minimal; bruising: none; knee ROM: limited by reported pain CV: brisk extremity capillary refill of LLE; 2+ DP pulse of LLE. Skin: warm and dry; no visible rashes Neurologic: normal sensation and strength of LLE Psychological: alert and cooperative; normal mood and affect  Imaging: DG Knee Complete 4 Views Left  Result Date: 10/24/2021 CLINICAL DATA:  Patient status post fall.  Unable to bend knee. EXAM: LEFT KNEE - COMPLETE 4+ VIEW COMPARISON:  None. FINDINGS: Along the inferior aspect of the patella there is a transverse lucency. Mild overlying soft tissue swelling. Remainder of the osseous structures are unremarkable. IMPRESSION: Transverse nondisplaced fracture of the inferior patella. Electronically Signed   By: Annia Belt M.D.   On: 10/24/2021 10:26       Allergies  Allergen Reactions   Morphine And Related Anaphylaxis, Swelling and Other (See Comments)    "swells shut" the patient's throat    Past Medical History:  Diagnosis Date   Anemia    Asthma    Panic attacks    Pulmonary embolism, antepartum 02/08/2018   Indications: PE diagnosed 5/2@ [facility].  Left side, lower lobe;   Admitted 5/2-5/6 - heparin drip>lovenox 60BID.  ED evaluation 5/8 2/2 persistent SOB - negative DVT, nml ECG Anticoagulation goal is therapeutic  Therapeutic dose is Lovenox  1mg /kg SQ Q12--currently on 60 mg BID  Monitoring is indicated for patients on therapeutic anticoagulation  Will check Anti-Xa level 4-6 hours after 3rd dose Q trime   Social History   Socioeconomic History   Marital status: Single    Spouse name: Not on file   Number of children: Not on file   Years of education: Not on file    Highest education level: Not on file  Occupational History   Not on file  Tobacco Use   Smoking status: Former    Types: Cigarettes   Smokeless tobacco: Never  Vaping Use   Vaping Use: Never used  Substance and Sexual Activity   Alcohol use: Not Currently   Drug use: No   Sexual activity: Yes  Other Topics Concern   Not on file  Social History Narrative   Not on file   Social Determinants of Health   Financial Resource Strain: Not on file  Food Insecurity: No Food Insecurity   Worried About Running Out of Food in the Last Year: Never true   Ran Out of Food in the Last Year: Never true  Transportation Needs: No Transportation Needs   Lack of Transportation (Medical): No   Lack of Transportation (Non-Medical): No  Physical Activity: Not on file  Stress: Not on file  Social Connections: Not on file   Family History  Problem Relation Age of Onset   Pulmonary embolism Mother    Ulcers Sister    Past Surgical History:  Procedure Laterality Date   NO PAST SURGERIES         , MD 10/26/21 [phone]

## 2021-11-01 DIAGNOSIS — Z9889 Other specified postprocedural states: Secondary | ICD-10-CM | POA: Diagnosis not present

## 2021-11-01 DIAGNOSIS — M25562 Pain in left knee: Secondary | ICD-10-CM | POA: Diagnosis not present

## 2021-11-23 DIAGNOSIS — M25562 Pain in left knee: Secondary | ICD-10-CM | POA: Diagnosis not present

## 2021-11-23 DIAGNOSIS — Z9889 Other specified postprocedural states: Secondary | ICD-10-CM | POA: Diagnosis not present

## 2021-12-20 ENCOUNTER — Encounter (HOSPITAL_COMMUNITY): Payer: Self-pay | Admitting: Emergency Medicine

## 2021-12-20 ENCOUNTER — Emergency Department (HOSPITAL_COMMUNITY)
Admission: EM | Admit: 2021-12-20 | Discharge: 2021-12-20 | Disposition: A | Discharge status: Home / Self Care | Payer: Medicaid Other | Attending: Emergency Medicine | Admitting: Emergency Medicine

## 2021-12-20 ENCOUNTER — Encounter (HOSPITAL_COMMUNITY): Payer: Self-pay | Admitting: *Deleted

## 2021-12-20 ENCOUNTER — Emergency Department (HOSPITAL_COMMUNITY): Payer: Medicaid Other

## 2021-12-20 ENCOUNTER — Ambulatory Visit (HOSPITAL_COMMUNITY)
Admission: EM | Admit: 2021-12-20 | Discharge: 2021-12-20 | Disposition: A | Discharge status: Home / Self Care | Payer: Medicaid Other | Attending: Family Medicine | Admitting: Family Medicine

## 2021-12-20 ENCOUNTER — Other Ambulatory Visit: Payer: Self-pay

## 2021-12-20 DIAGNOSIS — S199XXA Unspecified injury of neck, initial encounter: Secondary | ICD-10-CM | POA: Diagnosis not present

## 2021-12-20 DIAGNOSIS — Y92410 Unspecified street and highway as the place of occurrence of the external cause: Secondary | ICD-10-CM | POA: Insufficient documentation

## 2021-12-20 DIAGNOSIS — M791 Myalgia, unspecified site: Secondary | ICD-10-CM | POA: Diagnosis not present

## 2021-12-20 DIAGNOSIS — M7918 Myalgia, other site: Secondary | ICD-10-CM | POA: Insufficient documentation

## 2021-12-20 DIAGNOSIS — R0789 Other chest pain: Secondary | ICD-10-CM | POA: Diagnosis not present

## 2021-12-20 DIAGNOSIS — Z20822 Contact with and (suspected) exposure to covid-19: Secondary | ICD-10-CM | POA: Diagnosis not present

## 2021-12-20 DIAGNOSIS — R519 Headache, unspecified: Secondary | ICD-10-CM | POA: Diagnosis not present

## 2021-12-20 DIAGNOSIS — S0990XA Unspecified injury of head, initial encounter: Secondary | ICD-10-CM | POA: Diagnosis not present

## 2021-12-20 DIAGNOSIS — S3991XA Unspecified injury of abdomen, initial encounter: Secondary | ICD-10-CM | POA: Diagnosis not present

## 2021-12-20 DIAGNOSIS — S3993XA Unspecified injury of pelvis, initial encounter: Secondary | ICD-10-CM | POA: Diagnosis not present

## 2021-12-20 DIAGNOSIS — S29001A Unspecified injury of muscle and tendon of front wall of thorax, initial encounter: Secondary | ICD-10-CM | POA: Diagnosis present

## 2021-12-20 DIAGNOSIS — H43399 Other vitreous opacities, unspecified eye: Secondary | ICD-10-CM | POA: Diagnosis not present

## 2021-12-20 DIAGNOSIS — S2001XA Contusion of right breast, initial encounter: Secondary | ICD-10-CM | POA: Diagnosis not present

## 2021-12-20 DIAGNOSIS — S8991XA Unspecified injury of right lower leg, initial encounter: Secondary | ICD-10-CM | POA: Diagnosis not present

## 2021-12-20 DIAGNOSIS — M25561 Pain in right knee: Secondary | ICD-10-CM | POA: Diagnosis not present

## 2021-12-20 DIAGNOSIS — Z041 Encounter for examination and observation following transport accident: Secondary | ICD-10-CM | POA: Diagnosis not present

## 2021-12-20 DIAGNOSIS — S299XXA Unspecified injury of thorax, initial encounter: Secondary | ICD-10-CM | POA: Diagnosis not present

## 2021-12-20 DIAGNOSIS — R1031 Right lower quadrant pain: Secondary | ICD-10-CM | POA: Diagnosis not present

## 2021-12-20 DIAGNOSIS — R079 Chest pain, unspecified: Secondary | ICD-10-CM | POA: Diagnosis not present

## 2021-12-20 DIAGNOSIS — T1490XA Injury, unspecified, initial encounter: Secondary | ICD-10-CM

## 2021-12-20 LAB — CBC
HCT: 37.4 % (ref 36.0–46.0)
Hemoglobin: 11.8 g/dL — ABNORMAL LOW (ref 12.0–15.0)
MCH: 27.4 pg (ref 26.0–34.0)
MCHC: 31.6 g/dL (ref 30.0–36.0)
MCV: 87 fL (ref 80.0–100.0)
Platelets: 204 10*3/uL (ref 150–400)
RBC: 4.3 MIL/uL (ref 3.87–5.11)
RDW: 15.5 % (ref 11.5–15.5)
WBC: 5.7 10*3/uL (ref 4.0–10.5)
nRBC: 0 % (ref 0.0–0.2)

## 2021-12-20 LAB — COMPREHENSIVE METABOLIC PANEL
ALT: 13 U/L (ref 0–44)
AST: 19 U/L (ref 15–41)
Albumin: 3.6 g/dL (ref 3.5–5.0)
Alkaline Phosphatase: 70 U/L (ref 38–126)
Anion gap: 8 (ref 5–15)
BUN: 12 mg/dL (ref 6–20)
CO2: 23 mmol/L (ref 22–32)
Calcium: 9.2 mg/dL (ref 8.9–10.3)
Chloride: 107 mmol/L (ref 98–111)
Creatinine, Ser: 0.66 mg/dL (ref 0.44–1.00)
GFR, Estimated: >60 mL/min (ref >60)
Glucose, Bld: 91 mg/dL (ref 70–99)
Potassium: 3.7 mmol/L (ref 3.5–5.1)
Sodium: 138 mmol/L (ref 135–145)
Total Bilirubin: 0.4 mg/dL (ref 0.3–1.2)
Total Protein: 7 g/dL (ref 6.5–8.1)

## 2021-12-20 LAB — URINALYSIS, ROUTINE W REFLEX MICROSCOPIC
Bilirubin Urine: NEGATIVE
Glucose, UA: NEGATIVE mg/dL
Hgb urine dipstick: NEGATIVE
Ketones, ur: NEGATIVE mg/dL
Leukocytes,Ua: NEGATIVE
Nitrite: NEGATIVE
Protein, ur: NEGATIVE mg/dL
Specific Gravity, Urine: 1.024 (ref 1.005–1.030)
pH: 6 (ref 5.0–8.0)

## 2021-12-20 LAB — I-STAT CHEM 8, ED
BUN: 13 mg/dL (ref 6–20)
Calcium, Ion: 1.18 mmol/L (ref 1.15–1.40)
Chloride: 105 mmol/L (ref 98–111)
Creatinine, Ser: 0.6 mg/dL (ref 0.44–1.00)
Glucose, Bld: 89 mg/dL (ref 70–99)
HCT: 37 % (ref 36.0–46.0)
Hemoglobin: 12.6 g/dL (ref 12.0–15.0)
Potassium: 3.6 mmol/L (ref 3.5–5.1)
Sodium: 140 mmol/L (ref 135–145)
TCO2: 25 mmol/L (ref 22–32)

## 2021-12-20 LAB — I-STAT BETA HCG BLOOD, ED (MC, WL, AP ONLY): I-stat hCG, quantitative: <5 m[IU]/mL (ref <5)

## 2021-12-20 LAB — RESP PANEL BY RT-PCR (FLU A&B, COVID) ARPGX2
Influenza A by PCR: NEGATIVE
Influenza B by PCR: NEGATIVE
SARS Coronavirus 2 by RT PCR: NEGATIVE

## 2021-12-20 MED ORDER — METOCLOPRAMIDE HCL 5 MG/ML IJ SOLN
10.0000 mg | Freq: Once | INTRAMUSCULAR | Status: AC
  Administered 2021-12-20: 10 mg via INTRAVENOUS
  Filled 2021-12-20: qty 2

## 2021-12-20 MED ORDER — SODIUM CHLORIDE 0.9 % IV SOLN: INTRAVENOUS | Status: DC

## 2021-12-20 MED ORDER — ACETAMINOPHEN 500 MG PO TABS
1000.0000 mg | ORAL_TABLET | Freq: Once | ORAL | Status: AC
  Administered 2021-12-20: 1000 mg via ORAL
  Filled 2021-12-20: qty 2

## 2021-12-20 MED ORDER — IOHEXOL 350 MG/ML SOLN
95.0000 mL | Freq: Once | INTRAVENOUS | Status: AC | PRN
  Administered 2021-12-20: 95 mL via INTRAVENOUS

## 2021-12-20 MED ORDER — SODIUM CHLORIDE 0.9 % IV BOLUS
1000.0000 mL | Freq: Once | INTRAVENOUS | Status: AC
  Administered 2021-12-20: 1000 mL via INTRAVENOUS

## 2021-12-20 MED ORDER — CYCLOBENZAPRINE HCL 10 MG PO TABS: 10.0000 mg | ORAL_TABLET | Freq: Two times a day (BID) | ORAL | 0 refills | Status: DC | PRN

## 2021-12-20 MED ORDER — DIPHENHYDRAMINE HCL 50 MG/ML IJ SOLN
12.5000 mg | Freq: Once | INTRAMUSCULAR | Status: AC
Start: 2021-12-20 — End: 2021-12-20
  Administered 2021-12-20: 12.5 mg via INTRAVENOUS
  Filled 2021-12-20: qty 1

## 2021-12-20 NOTE — Discharge Instructions (Signed)
Your imaging today did not show evidence of any significant traumatic injuries.  Please follow-up with your primary doctor for further management.  Please use the muscle medicine to help with muscle soreness and spasms that may develop.  Please rest and stay hydrated.  If any symptoms change or worsen acutely, please return to the nearest emergency department. ?
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
## 2021-12-20 NOTE — Discharge Instructions (Signed)
Please proceed to the emergency room.
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
## 2021-12-20 NOTE — ED Notes (Signed)
Patient discharge instructions reviewed with the patient. The patient verbalized understanding of instructions. Patient discharged. 

## 2021-12-21 ENCOUNTER — Telehealth: Payer: Self-pay

## 2021-12-21 NOTE — Telephone Encounter (Signed)
Transition Care Management Follow-up Telephone Call ?Date of discharge and from where: 12/20/2021 from [facility] ?How have you been since you were released from the hospital? Patient stated that she is feeling okay. Went over ED recommendations with patient to establish with primary care.  ?Any questions or concerns? No ? ?Items Reviewed: ?Did the pt receive and understand the discharge instructions provided? Yes  ?Medications obtained and verified? Yes  ?Other? No  ?Any new allergies since your discharge? No  ?Dietary orders reviewed? No ?Do you have support at home? Yes  ? ?Functional Questionnaire: (I = Independent and D = Dependent) ?ADLs: I ? ?Bathing/Dressing- I ? ?Meal Prep- I ? ?Eating- I ? ?Maintaining continence- I ? ?Transferring/Ambulation- I ? ?Managing Meds- I ? ? ?Follow up appointments reviewed: ? ?PCP Hospital f/u appt confirmed? No   ?Specialist Hospital f/u appt confirmed? No   ?Are transportation arrangements needed? No  ?If their condition worsens, is the pt aware to call PCP or go to the Emergency Dept.? Yes ?Was the patient provided with contact information for the PCP's office or ED? Yes ?Was to pt encouraged to call back with questions or concerns? Yes ? ?

## 2022-06-08 IMAGING — CT CT ANGIO CHEST
2 of 6 series · 19 of 36 positions shown · IV contrast (omnipaque)
Comparison: None.

CLINICAL DATA: Left-sided chest pressure

EXAM:
CT ANGIOGRAPHY CHEST WITH CONTRAST
TECHNIQUE: Multidetector CT imaging of the chest was performed using the
standard protocol during bolus administration of intravenous
contrast. Multiplanar CT image reconstructions and MIPs were
obtained to evaluate the vascular anatomy.
CONTRAST:  100mL OMNIPAQUE IOHEXOL 350 MG/ML SOLN

[Series 7: pe thins · axial · 0.63mm/px · z∈[+1043,+1249]mm · 18 of 328 slices shown]
[im 17/328  lung]
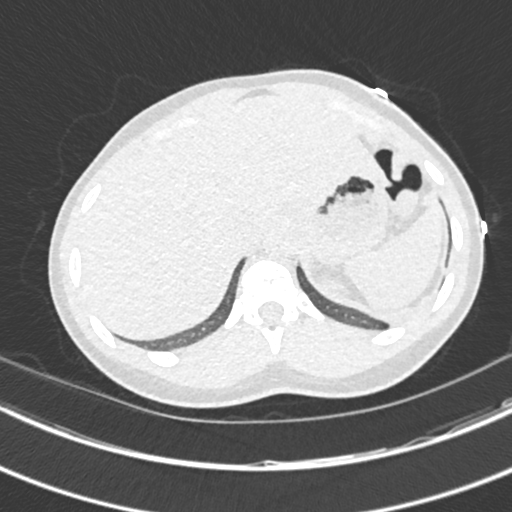
[im 33/328  mediastinal]
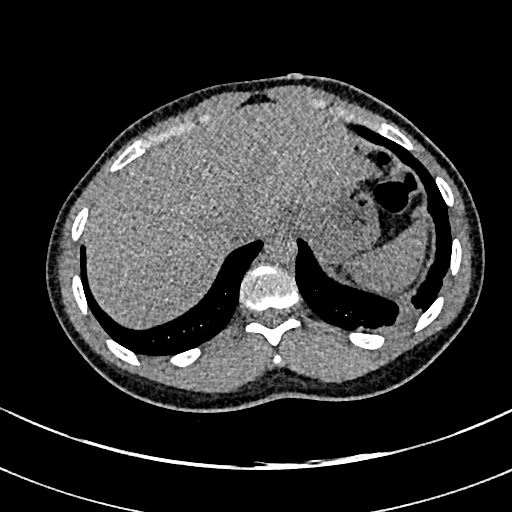
[im 50/328  lung]
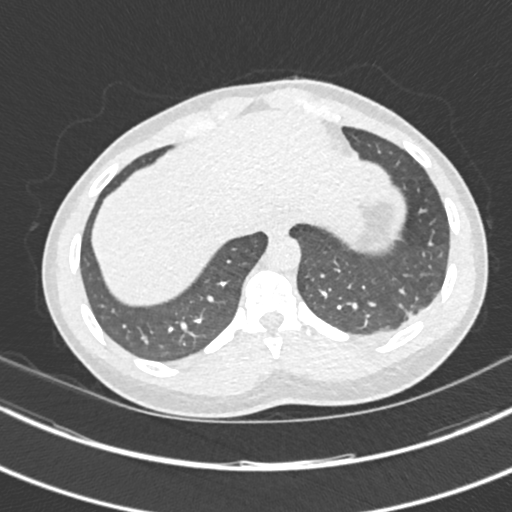
[im 66/328  mediastinal]
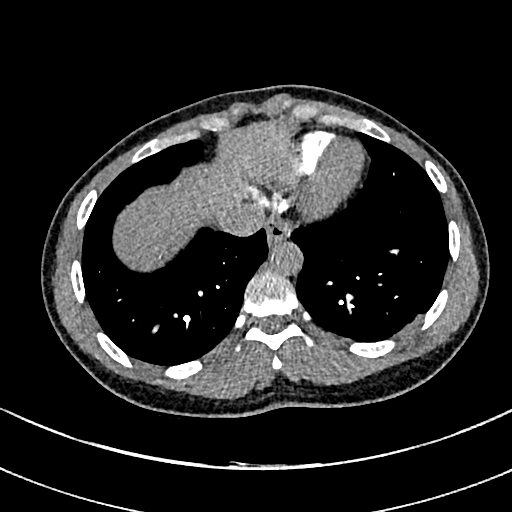
[im 82/328  lung]
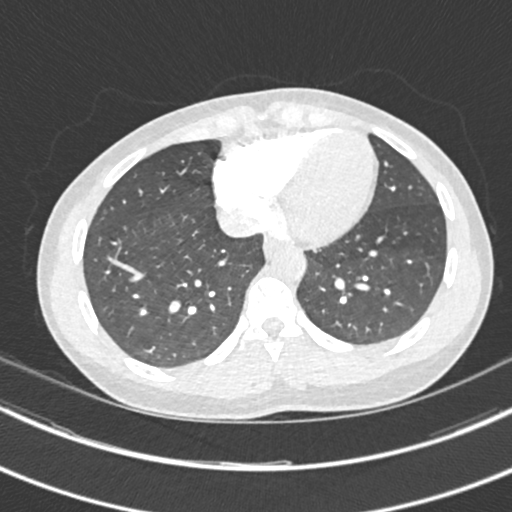
[im 99/328  mediastinal]
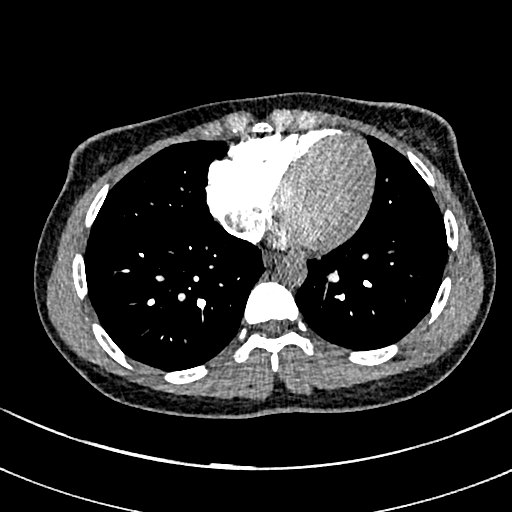
[im 115/328  lung]
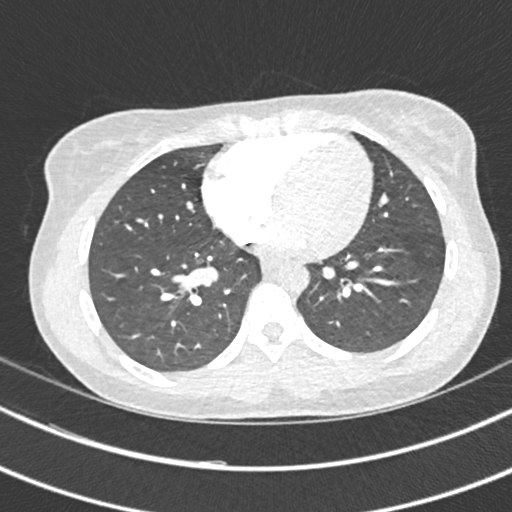
[im 131/328  mediastinal]
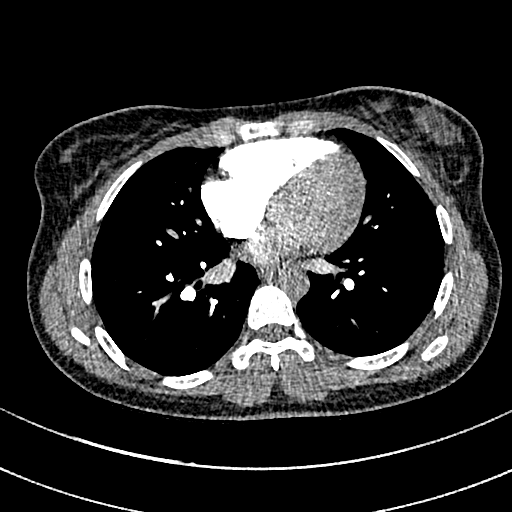
[im 148/328  lung]
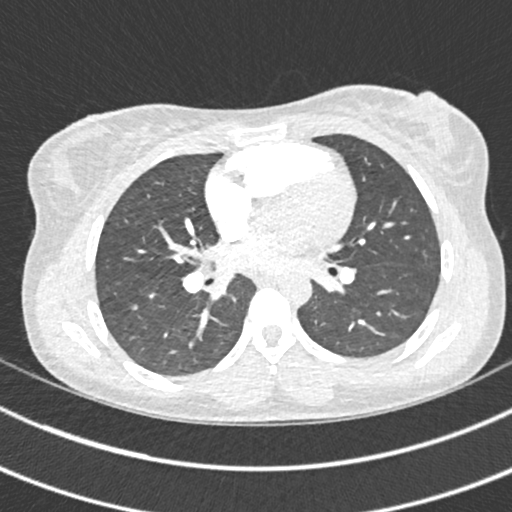
[im 180/328  mediastinal]
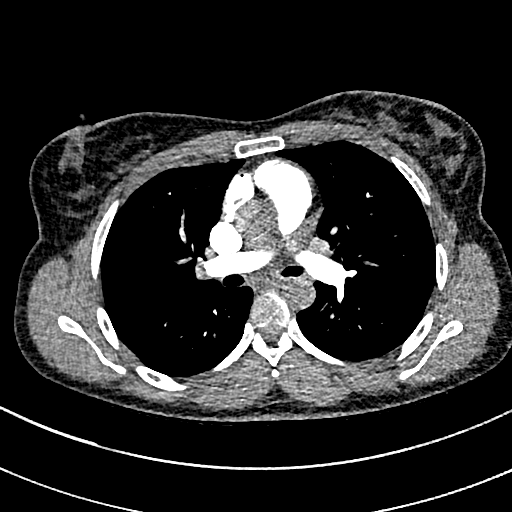
[im 197/328  lung]
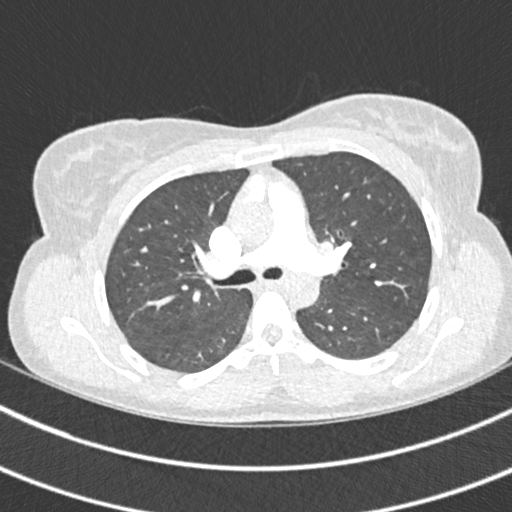
[im 213/328  mediastinal]
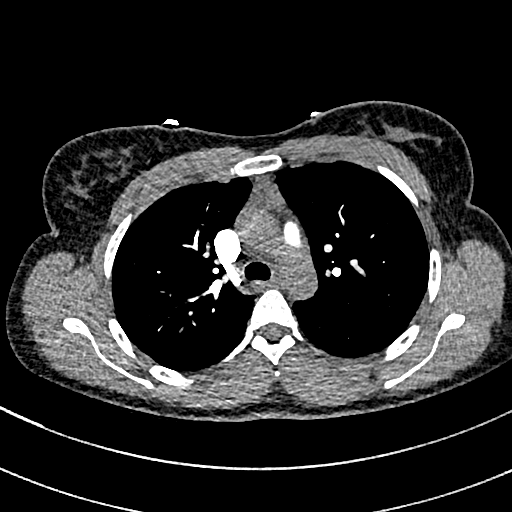
[im 229/328  lung]
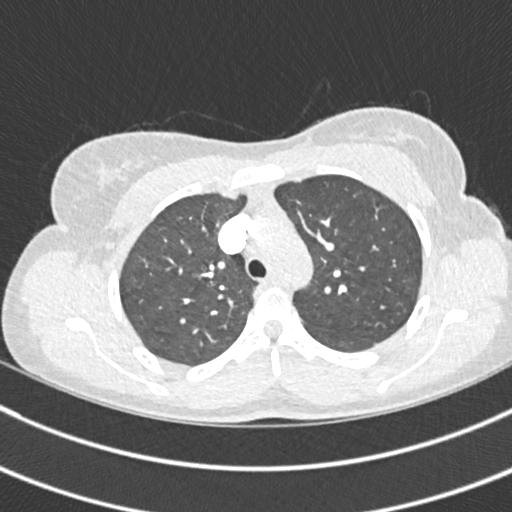
[im 246/328  mediastinal]
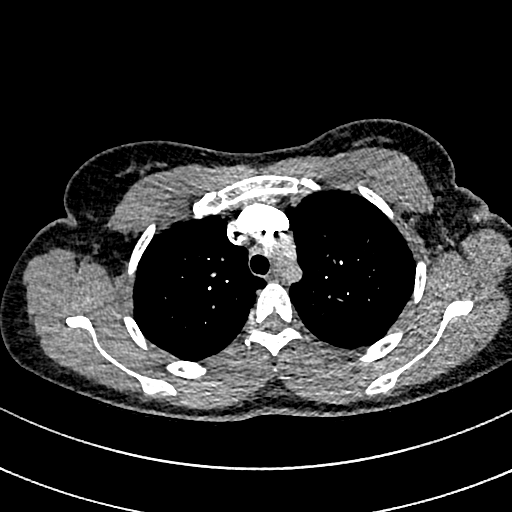
[im 262/328  lung]
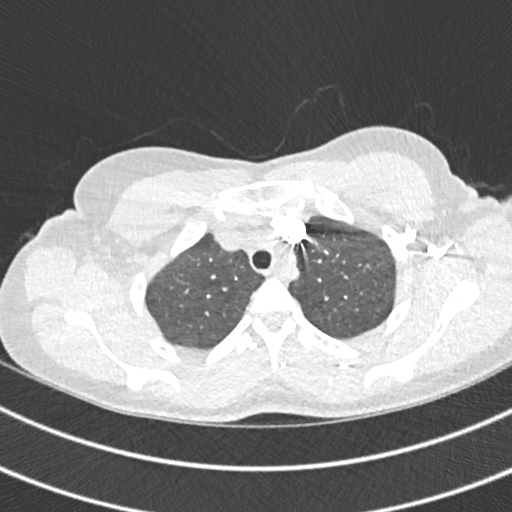
[im 278/328  mediastinal]
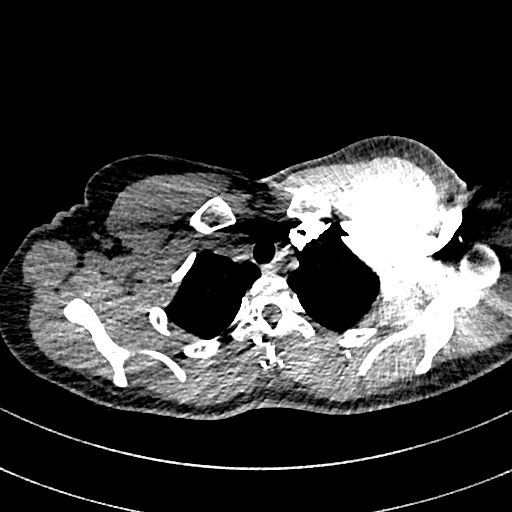
[im 295/328  lung]
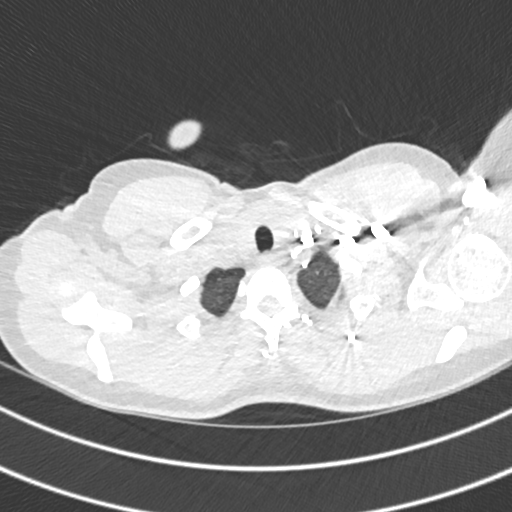
[im 311/328  mediastinal]
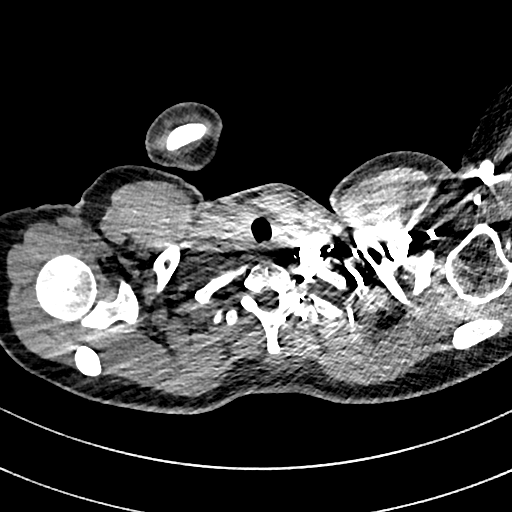

[Series 8: pe 2mm cor · coronal · 0.48mm/px · 1 of 128 slices shown]
[im 64/128  mediastinal]
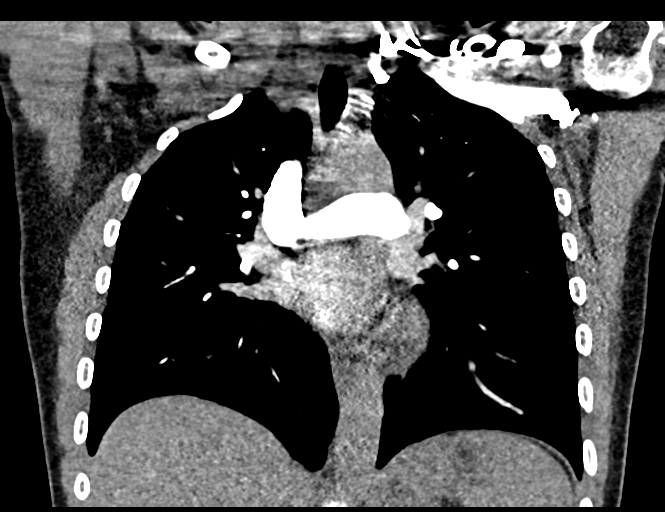

[19 of 36 positions shown; findings below may reference images not displayed]

FINDINGS: Cardiovascular: There is a optimal opacification of the pulmonary
arteries. There is no central,segmental, or subsegmental filling
defects within the pulmonary arteries. The heart is normal in size.
No pericardial effusion or thickening. No evidence right heart
strain. There is normal three-vessel brachiocephalic anatomy without
proximal stenosis. The thoracic aorta is normal in appearance.

Mediastinum/Nodes: No hilar, mediastinal, or axillary adenopathy.
Thyroid gland, trachea, and esophagus demonstrate no significant
findings.

Lungs/Pleura: Rounded patchy airspace opacity seen at the posterior
left lung base. The right lung is clear. No pleural effusion.

Upper Abdomen: No acute abnormalities present in the visualized
portions of the upper abdomen.

Musculoskeletal: No chest wall abnormality. No acute or significant
osseous findings.

Review of the MIP images confirms the above findings.
IMPRESSION: No central, segmental, or subsegmental pulmonary embolism.

Rounded patchy airspace opacity at the left lung base which could be
due to infectious etiology or atelectasis

## 2022-07-03 IMAGING — US US OB COMP LESS 14 WK
1 series · 15 of 28 positions shown · non-contrast
Comparison: None

CLINICAL DATA: Hyperemesis, pregnancy of uncertain fetal viability,
LMP 04/19/2020

EXAM:
OBSTETRIC <14 WK ULTRASOUND
TECHNIQUE: Transabdominal ultrasound was performed for evaluation of the
gestation as well as the maternal uterus and adnexal regions.

[Series 1: us ob comp less 14 wk · 50 acquisitions, 15 frames shown]
[im 1/50]
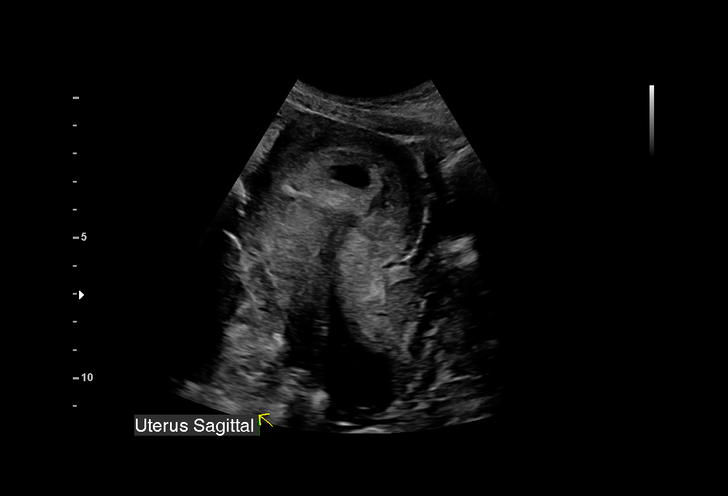
[im 4/50]
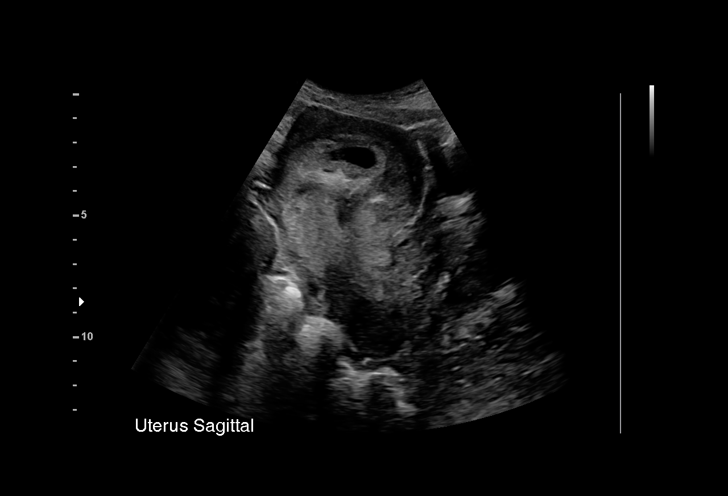
[im 8/50]
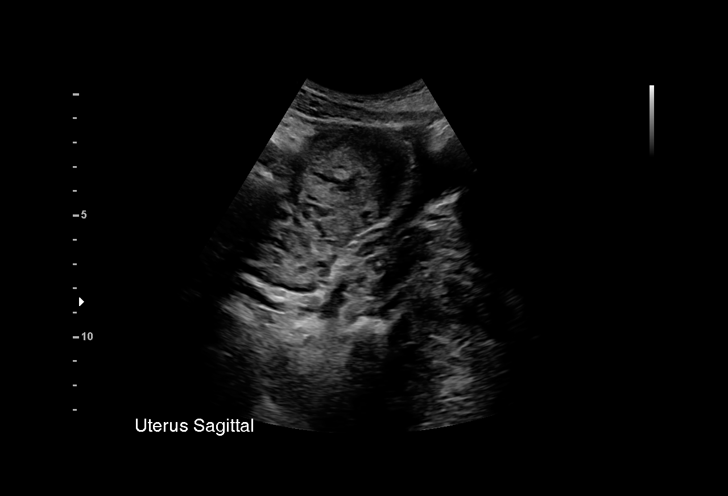
[im 11/50]
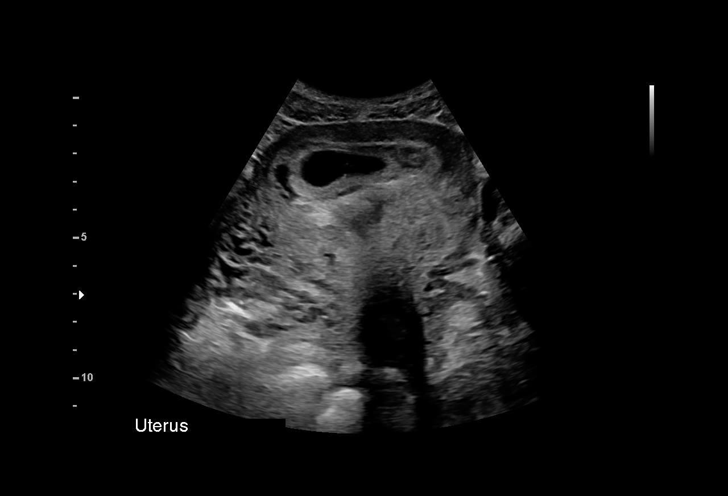
[im 15/50]
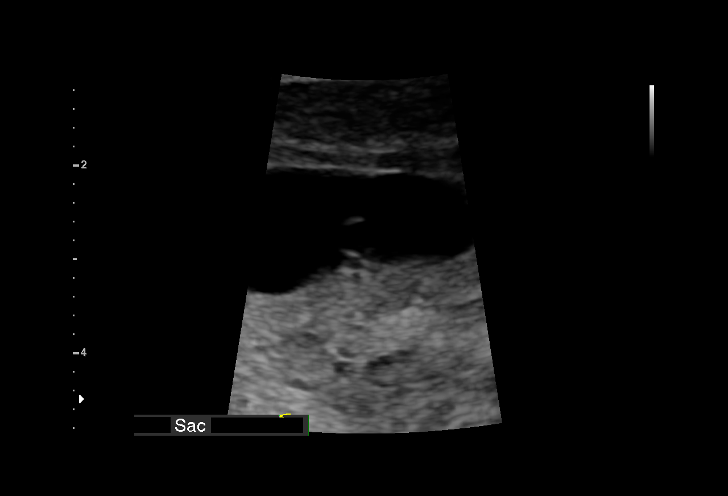
[im 19/50]
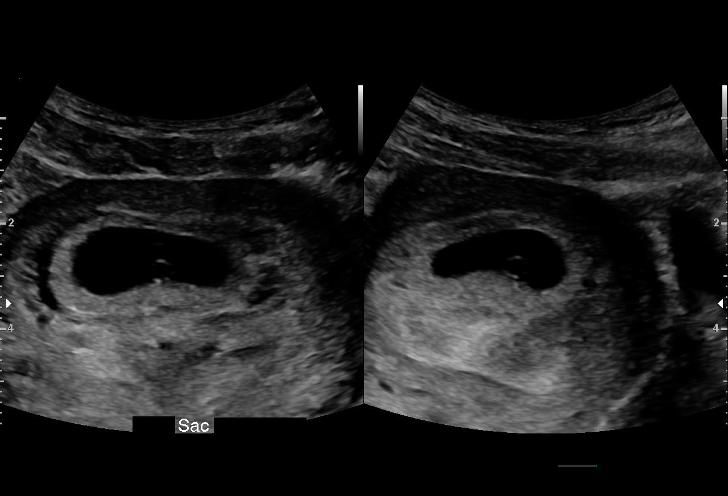
[im 22/50]
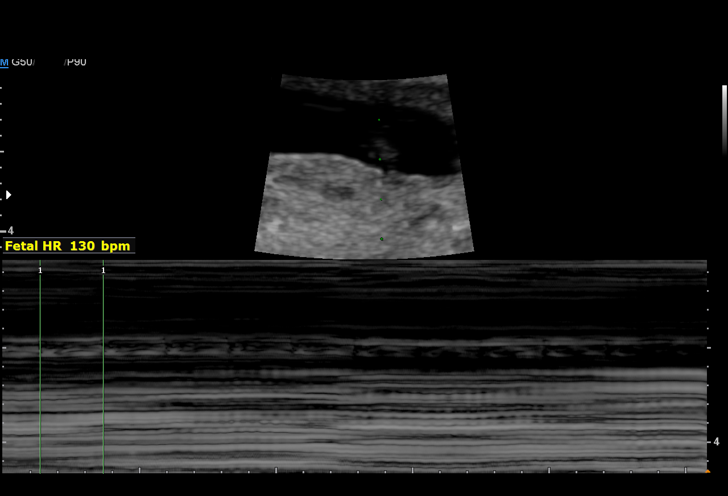
[im 26/50]
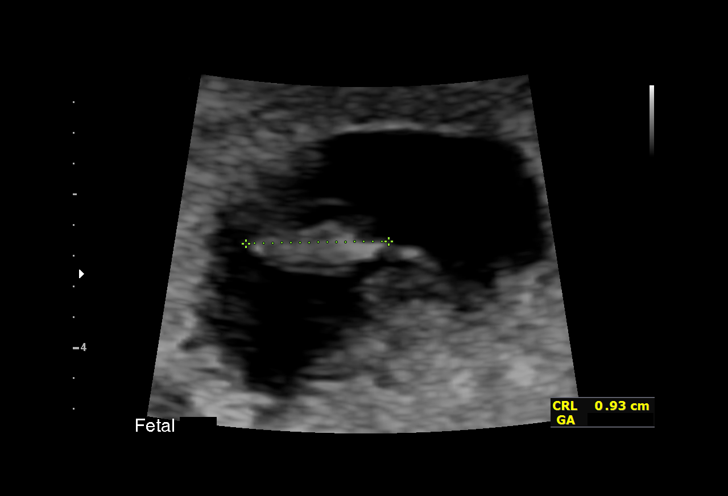
[im 28/50]
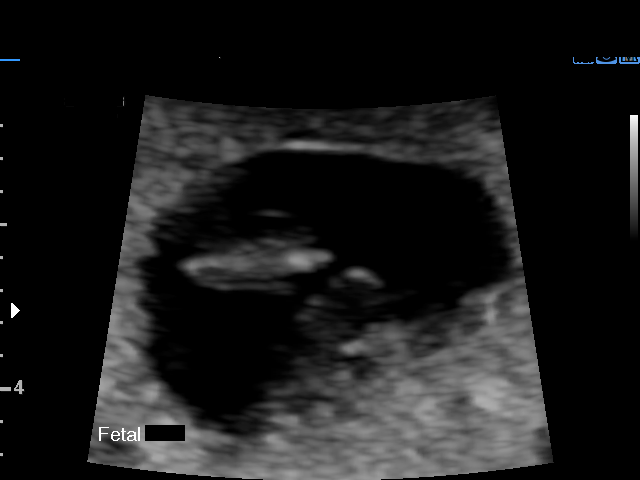
[im 31/50]
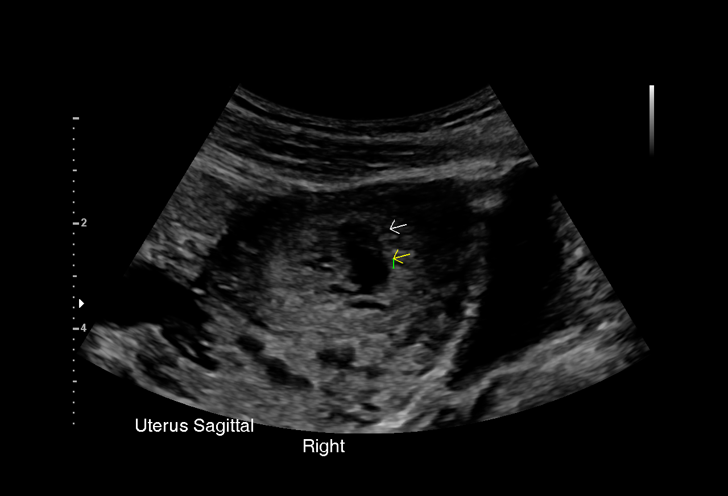
[im 35/50]
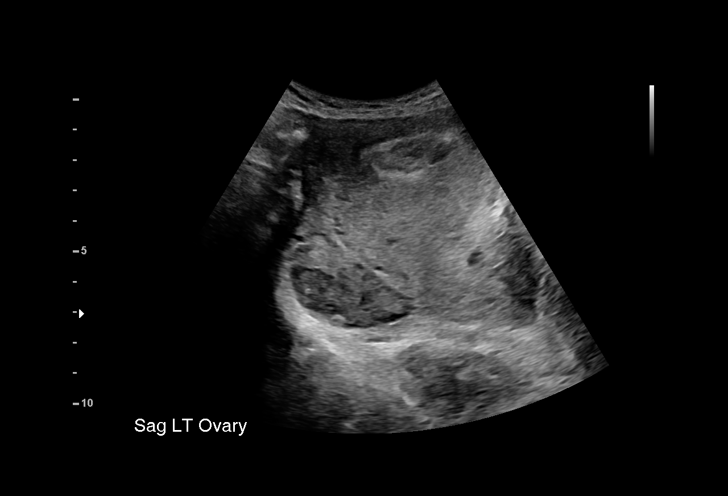
[im 39/50]
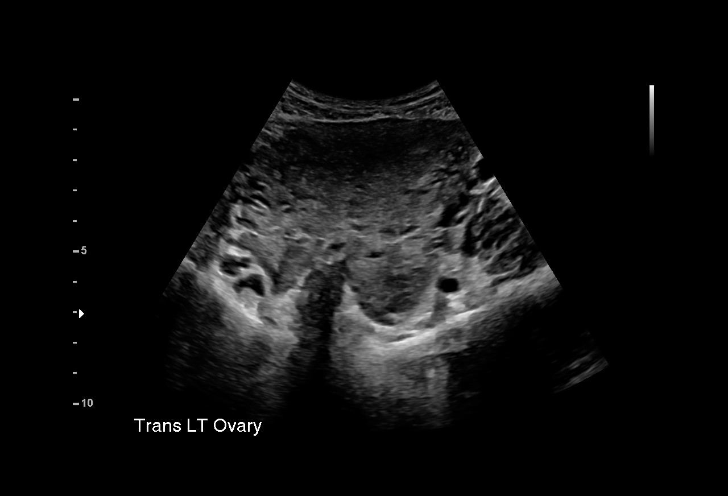
[im 42/50]
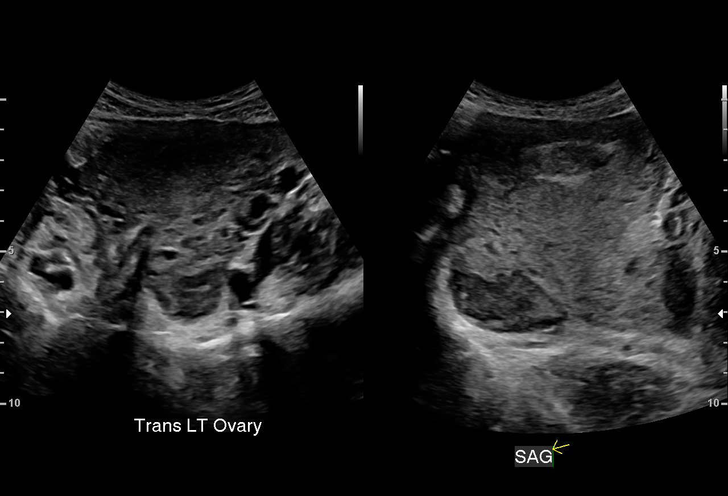
[im 46/50]
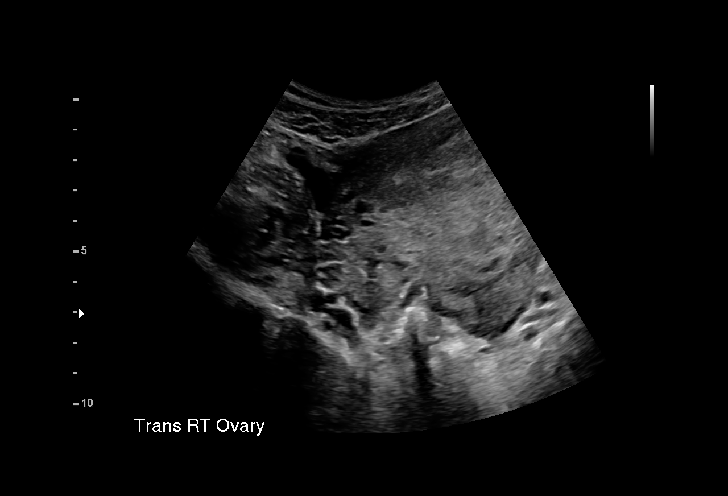
[im 50/50]
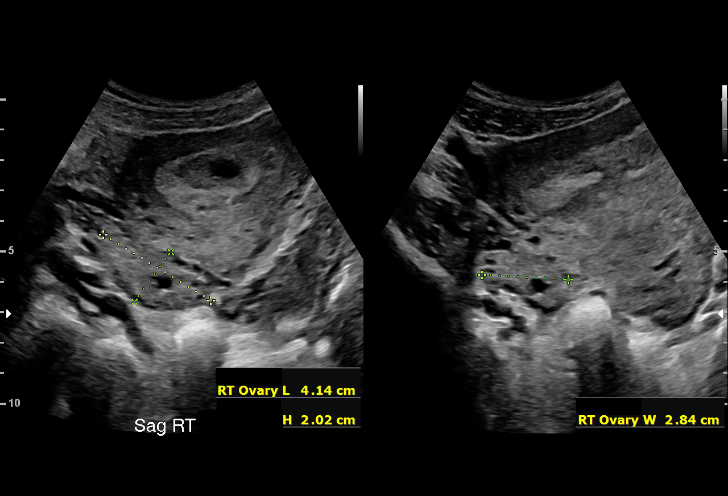

[15 of 28 positions shown; findings below may reference images not displayed]

FINDINGS: Intrauterine gestational sac: Present, single

Yolk sac:  Present

Embryo:  Present

Cardiac Activity: Present

Heart Rate: 130 bpm

CRL:   9.0 mm   6 w 6 d                  US EDC: 02/02/2021

Subchorionic hemorrhage:  2 small subchronic hemorrhages

Maternal uterus/adnexae:

LEFT ovary normal size and morphology, 2.7 x 4.4 x 2.7 cm.

RIGHT ovary normal size and morphology, 4.1 x 2.0 x 2.8 cm.

No free pelvic fluid or adnexal masses.
IMPRESSION: Single live intrauterine gestation at 6 weeks 6 days EGA.

Two small subchronic hemorrhages.

## 2022-11-01 DIAGNOSIS — Z113 Encounter for screening for infections with a predominantly sexual mode of transmission: Secondary | ICD-10-CM | POA: Diagnosis not present

## 2022-11-01 DIAGNOSIS — N766 Ulceration of vulva: Secondary | ICD-10-CM | POA: Diagnosis not present

## 2022-11-29 IMAGING — US US MFM OB FOLLOW-UP
1 series · 14 of 28 positions shown · non-contrast
Comparison: none

[Series 1: us mfm ob follow-up · 14 of 50 slices shown]
[im 2/50]
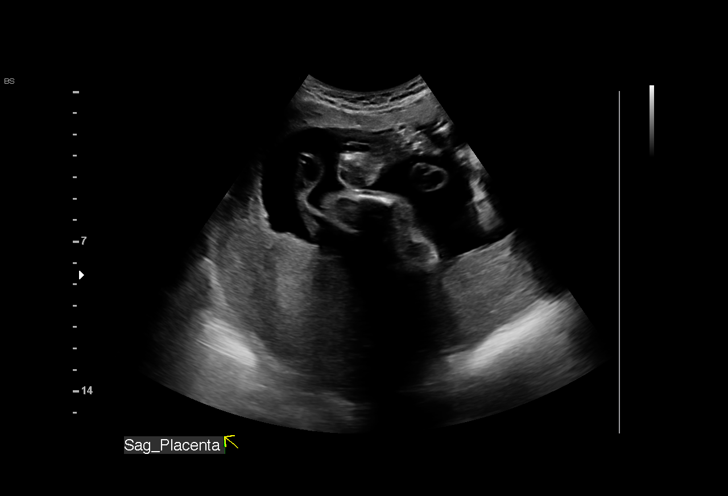
[im 6/50]
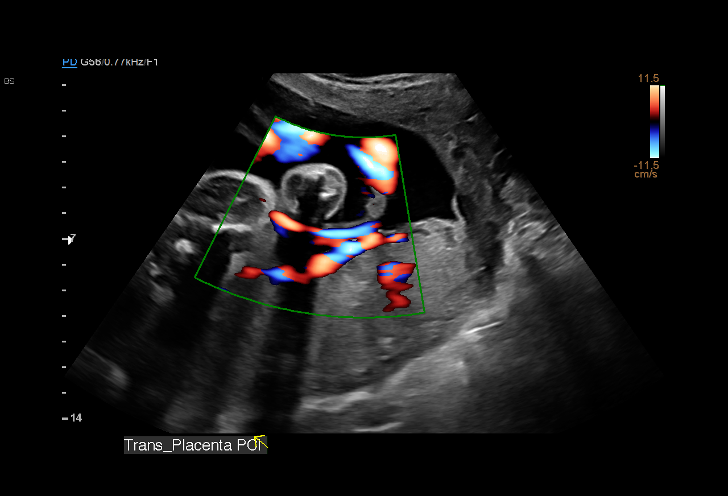
[im 10/50]
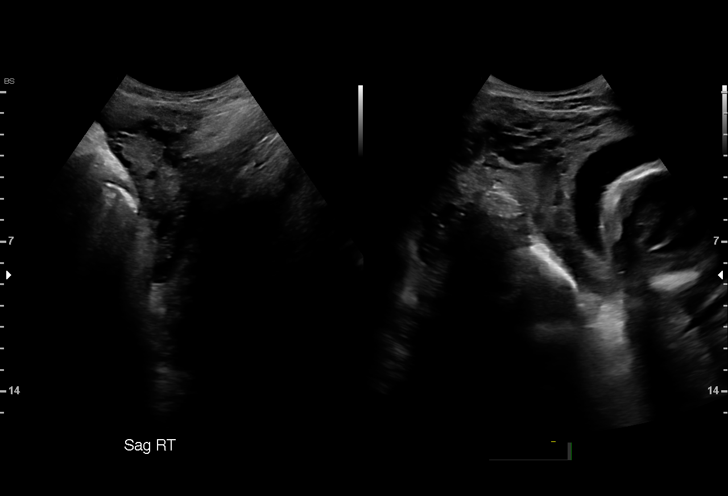
[im 13/50]
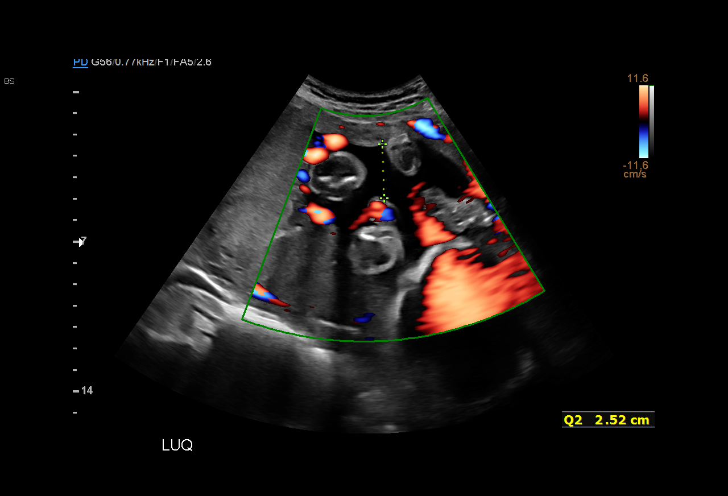
[im 17/50]
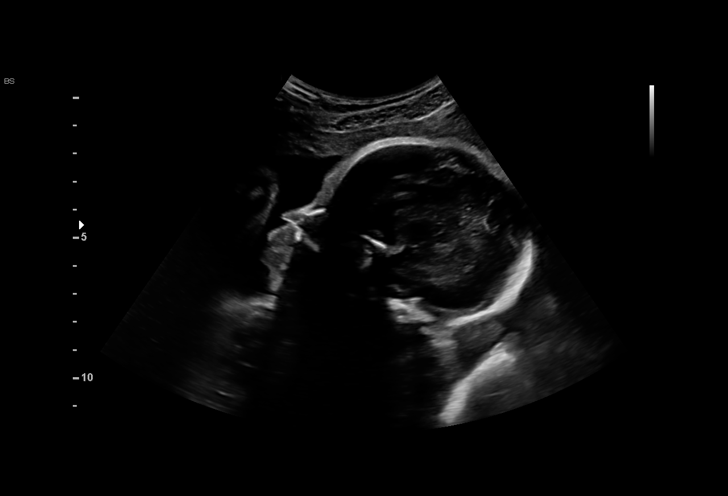
[im 20/50]
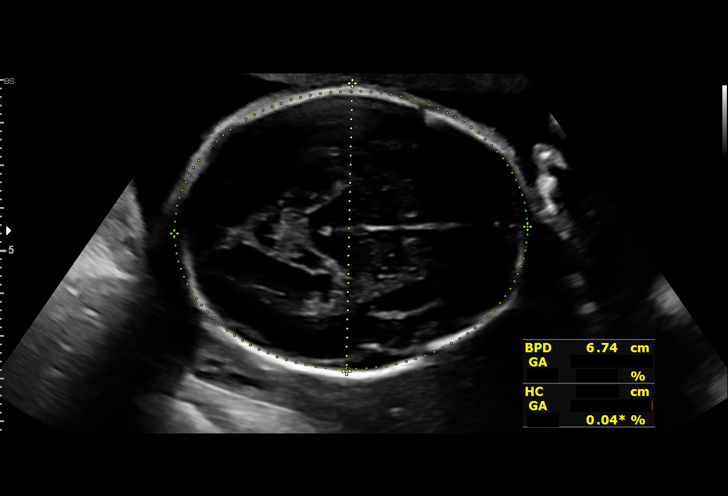
[im 24/50]
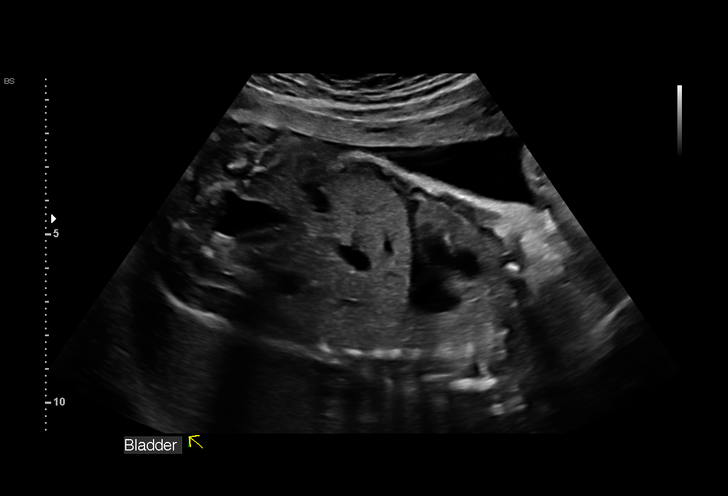
[im 28/50]
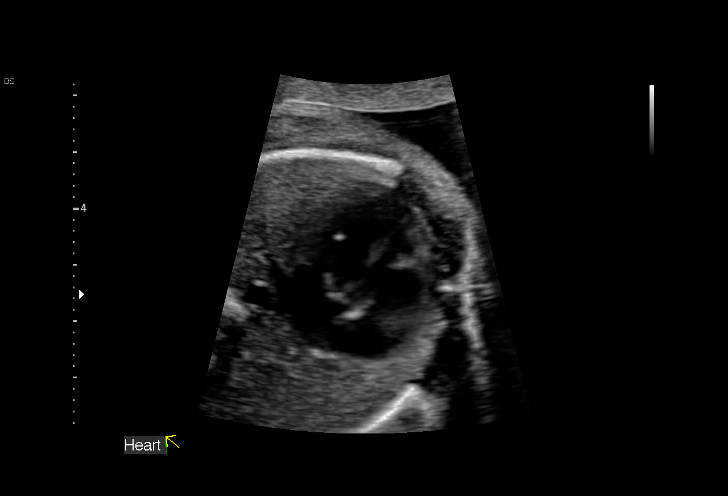
[im 31/50]
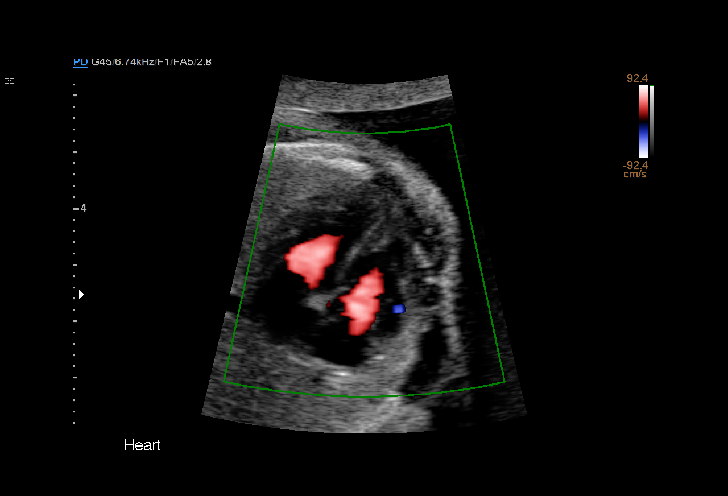
[im 35/50]
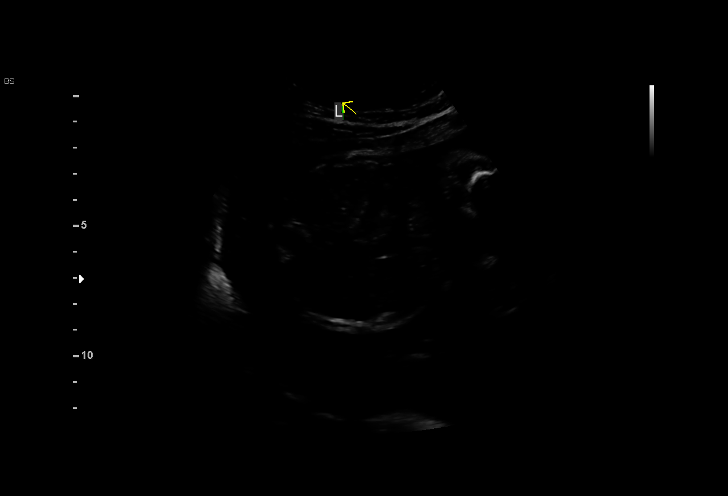
[im 39/50]
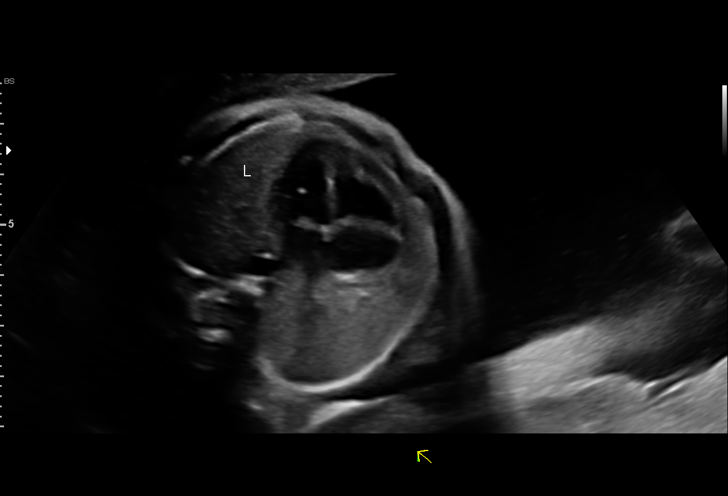
[im 42/50]
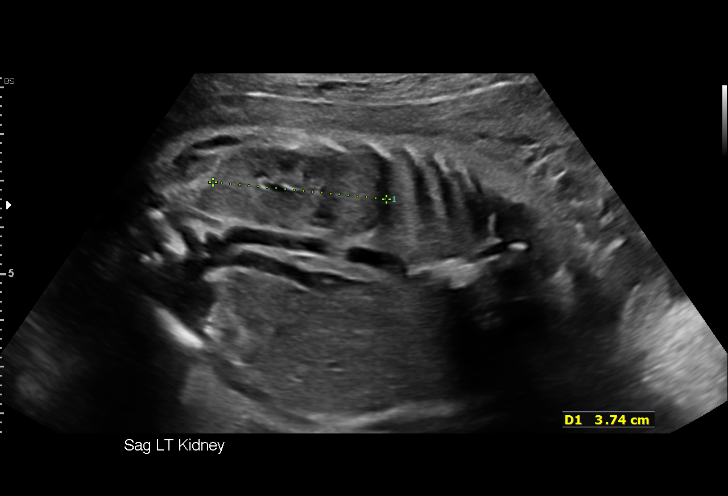
[im 46/50]
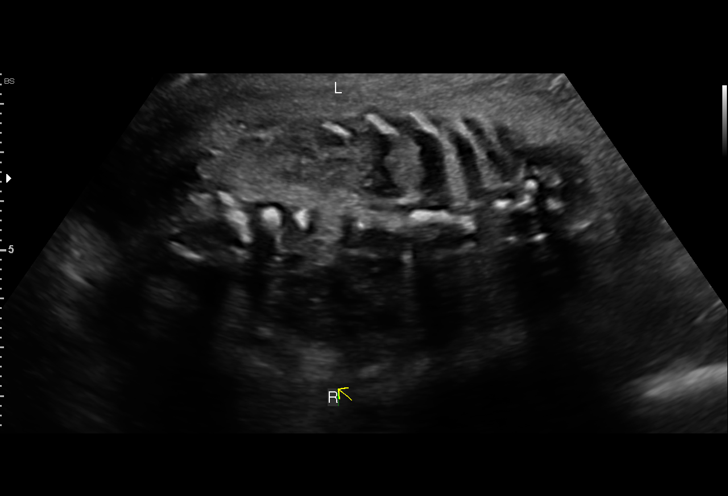
[im 50/50]
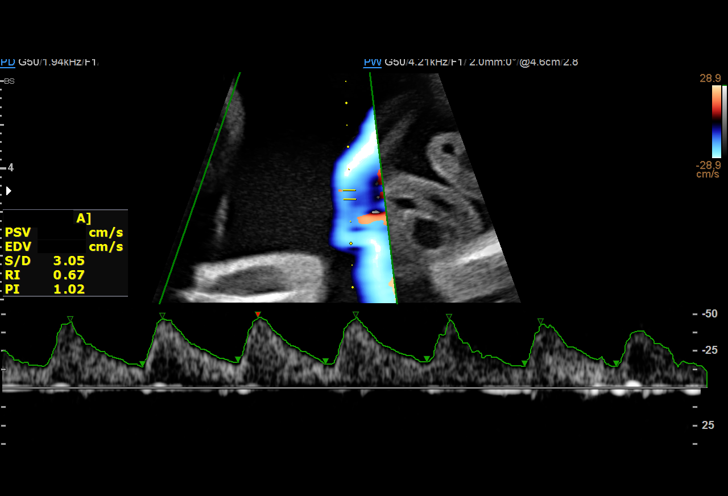

[14 of 28 positions shown; findings below may reference images not displayed]

Indications

 28 weeks gestation of pregnancy
 Small for gestational age fetus affecting
 management of mother
 Prior poor obstetrical history antepartum,
 second trimester (pulmonary embolus); on
 lovenox
 [place]: Silent Genetic carrier (alpha
 thalassemia)
 Fetal abnormality - other known or
 suspected (abdominal calcifications)
 Substance abuse affecting pregnancy,
 antepartum (AMAZIGH)
 AFP: Neg,  PAN: Low risk, MAle, 10.7%
Fetal Evaluation

 Num Of Fetuses:         1
 Fetal Heart Rate(bpm):  155
 Cardiac Activity:       Observed
 Presentation:           Cephalic
 Placenta:               Posterior
 P. Cord Insertion:      Visualized, central
 Amniotic Fluid
 AFI FV:      Within normal limits

 AFI Sum(cm)     %Tile       Largest Pocket(cm)
 12.27           30

 RUQ(cm)       RLQ(cm)       LUQ(cm)        LLQ(cm)

Biometry

 BPD:      67.3  mm     G. Age:  27w 1d         11  %    CI:        80.33   %    70 - 86
                                                         FL/HC:      20.0   %    18.8 -
 HC:      237.2  mm     G. Age:  25w 5d        < 1  %    HC/AC:      1.03        1.05 -
 AC:      230.1  mm     G. Age:  27w 3d         21  %    FL/BPD:     70.6   %    71 - 87
 FL:       47.5  mm     G. Age:  25w 6d        1.3  %    FL/AC:      20.6   %    20 - 24
 LV:          3  mm

 Est. FW:     969  gm      2 lb 2 oz    4.5  %
OB History

 Gravidity:    2         Term:   1
 Living:       1
Gestational Age

 LMP:           29w 3d        Date:  04/19/20                 EDD:   01/24/21
 U/S Today:     26w 4d                                        EDD:   02/13/21
 Best:          28w 1d     Det. By:  Early Ultrasound         EDD:   02/02/21
                                     (06/15/20)
Anatomy

 Cranium:               Appears normal         Heart:                  Appears normal;
                                                                       EIF (LV)
 Cavum:                 Appears normal         Stomach:                Appears normal, left
                                                                       sided
 Ventricles:            Appears normal         Abdomen:                Abd calcification
                                                                       on (L) nwv
 Face:                  Appears normal         Kidneys:                Appear normal
                        (orbits and profile)
 Lips:                  Appears normal         Bladder:                Appears normal
Doppler - Fetal Vessels

 Umbilical Artery
  S/D     %tile      RI    %tile                             ADFV    RDFV
  3.41       71    0.71       75                                No      No

Cervix Uterus Adnexa

 Cervix
 Not visualized (advanced GA >26wks)
 Uterus
 No abnormality visualized.

 Right Ovary
 Not visualized.

 Left Ovary
 Not visualized.

 Cul De Sac
 No free fluid seen.

 Adnexa
 No adnexal mass visualized.
Impression

 Follow up growth due to prior FGR (prior EFW 9th% with AC
 38th%)
 Normal interval growth with measurements small for dates
 with EFW 4.5% and AC 21st%
 Good fetal movement and amniotic fluid volume
 The UA Dopplers were normal with no evidence of AEDF or
 REDF.

 I discussed today's finding of a decreased EFW however
 there is positive growth. An NST was performed and was
 reactive.
 She will return for UA Dopplers in 1 week.

 All questions answered.
Recommendations

 Continue weekly testing with UA Dopplers
 Repeat growth in 4 weeks.

## 2023-02-19 ENCOUNTER — Ambulatory Visit: Payer: Medicaid Other | Admitting: Nurse Practitioner

## 2023-05-22 ENCOUNTER — Inpatient Hospital Stay (HOSPITAL_COMMUNITY): Payer: Medicaid Other

## 2023-05-22 ENCOUNTER — Encounter (HOSPITAL_COMMUNITY): Payer: Self-pay | Admitting: *Deleted

## 2023-05-22 ENCOUNTER — Inpatient Hospital Stay (HOSPITAL_COMMUNITY)
Admission: AD | Admit: 2023-05-22 | Discharge: 2023-05-22 | Disposition: A | Discharge status: Home / Self Care | Payer: Medicaid Other | Source: Home / Self Care | Attending: Obstetrics and Gynecology | Admitting: Obstetrics and Gynecology

## 2023-05-22 DIAGNOSIS — R000 Tachycardia, unspecified: Secondary | ICD-10-CM | POA: Diagnosis present

## 2023-05-22 DIAGNOSIS — Z86711 Personal history of pulmonary embolism: Secondary | ICD-10-CM | POA: Diagnosis not present

## 2023-05-22 DIAGNOSIS — R109 Unspecified abdominal pain: Secondary | ICD-10-CM | POA: Diagnosis not present

## 2023-05-22 DIAGNOSIS — M7918 Myalgia, other site: Secondary | ICD-10-CM | POA: Diagnosis not present

## 2023-05-22 DIAGNOSIS — R1110 Vomiting, unspecified: Secondary | ICD-10-CM | POA: Diagnosis present

## 2023-05-22 DIAGNOSIS — Z3A01 Less than 8 weeks gestation of pregnancy: Secondary | ICD-10-CM | POA: Diagnosis not present

## 2023-05-22 DIAGNOSIS — J9811 Atelectasis: Secondary | ICD-10-CM | POA: Diagnosis not present

## 2023-05-22 DIAGNOSIS — R079 Chest pain, unspecified: Secondary | ICD-10-CM | POA: Diagnosis not present

## 2023-05-22 DIAGNOSIS — O26891 Other specified pregnancy related conditions, first trimester: Secondary | ICD-10-CM | POA: Diagnosis not present

## 2023-05-22 DIAGNOSIS — R0782 Intercostal pain: Secondary | ICD-10-CM | POA: Insufficient documentation

## 2023-05-22 DIAGNOSIS — R9431 Abnormal electrocardiogram [ECG] [EKG]: Secondary | ICD-10-CM | POA: Insufficient documentation

## 2023-05-22 DIAGNOSIS — R42 Dizziness and giddiness: Secondary | ICD-10-CM | POA: Diagnosis present

## 2023-05-22 DIAGNOSIS — O208 Other hemorrhage in early pregnancy: Secondary | ICD-10-CM | POA: Diagnosis not present

## 2023-05-22 HISTORY — DX: Headache, unspecified: R51.9

## 2023-05-22 HISTORY — DX: Calculus of kidney: N20.0

## 2023-05-22 HISTORY — DX: Urinary tract infection, site not specified: N39.0

## 2023-05-22 LAB — COMPREHENSIVE METABOLIC PANEL
ALT: 15 U/L (ref 0–44)
AST: 19 U/L (ref 15–41)
Albumin: 4.4 g/dL (ref 3.5–5.0)
Alkaline Phosphatase: 72 U/L (ref 38–126)
Anion gap: 13 (ref 5–15)
BUN: 12 mg/dL (ref 6–20)
CO2: 23 mmol/L (ref 22–32)
Calcium: 9.9 mg/dL (ref 8.9–10.3)
Chloride: 99 mmol/L (ref 98–111)
Creatinine, Ser: 0.82 mg/dL (ref 0.44–1.00)
GFR, Estimated: >60 mL/min (ref >60)
Glucose, Bld: 118 mg/dL — ABNORMAL HIGH (ref 70–99)
Potassium: 3.3 mmol/L — ABNORMAL LOW (ref 3.5–5.1)
Sodium: 135 mmol/L (ref 135–145)
Total Bilirubin: 0.8 mg/dL (ref 0.3–1.2)
Total Protein: 8.6 g/dL — ABNORMAL HIGH (ref 6.5–8.1)

## 2023-05-22 LAB — CBC WITH DIFFERENTIAL/PLATELET
Abs Immature Granulocytes: 0.02 10*3/uL (ref 0.00–0.07)
Basophils Absolute: 0 10*3/uL (ref 0.0–0.1)
Basophils Relative: 0 %
Eosinophils Absolute: 0 10*3/uL (ref 0.0–0.5)
Eosinophils Relative: 0 %
HCT: 41.8 % (ref 36.0–46.0)
Hemoglobin: 13.8 g/dL (ref 12.0–15.0)
Immature Granulocytes: 0 %
Lymphocytes Relative: 20 %
Lymphs Abs: 1.5 10*3/uL (ref 0.7–4.0)
MCH: 28.5 pg (ref 26.0–34.0)
MCHC: 33 g/dL (ref 30.0–36.0)
MCV: 86.4 fL (ref 80.0–100.0)
Monocytes Absolute: 0.4 10*3/uL (ref 0.1–1.0)
Monocytes Relative: 5 %
Neutro Abs: 5.5 10*3/uL (ref 1.7–7.7)
Neutrophils Relative %: 75 %
Platelets: 247 10*3/uL (ref 150–400)
RBC: 4.84 MIL/uL (ref 3.87–5.11)
RDW: 13.4 % (ref 11.5–15.5)
WBC: 7.4 10*3/uL (ref 4.0–10.5)
nRBC: 0 % (ref 0.0–0.2)

## 2023-05-22 LAB — URINALYSIS, ROUTINE W REFLEX MICROSCOPIC
Bilirubin Urine: NEGATIVE
Glucose, UA: NEGATIVE mg/dL
Ketones, ur: 80 mg/dL — AB
Nitrite: NEGATIVE
Protein, ur: 30 mg/dL — AB
Specific Gravity, Urine: 1.028 (ref 1.005–1.030)
pH: 6 (ref 5.0–8.0)

## 2023-05-22 LAB — PROTIME-INR
INR: 1.1 (ref 0.8–1.2)
Prothrombin Time: 13.9 seconds (ref 11.4–15.2)

## 2023-05-22 LAB — D-DIMER, QUANTITATIVE: D-Dimer, Quant: 0.96 ug/mL-FEU — ABNORMAL HIGH (ref 0.00–0.50)

## 2023-05-22 LAB — HCG, QUANTITATIVE, PREGNANCY: hCG, Beta Chain, Quant, S: 45307 m[IU]/mL — ABNORMAL HIGH (ref <5)

## 2023-05-22 LAB — TROPONIN I (HIGH SENSITIVITY): Troponin I (High Sensitivity): 3 ng/L (ref <18)

## 2023-05-22 LAB — HIV ANTIBODY (ROUTINE TESTING W REFLEX): HIV Screen 4th Generation wRfx: NONREACTIVE

## 2023-05-22 LAB — BRAIN NATRIURETIC PEPTIDE: B Natriuretic Peptide: 11.9 pg/mL (ref 0.0–100.0)

## 2023-05-22 LAB — HEPATITIS B SURFACE ANTIGEN: Hepatitis B Surface Ag: NONREACTIVE

## 2023-05-22 MED ORDER — FENTANYL CITRATE (PF) 100 MCG/2ML IJ SOLN
50.0000 ug | Freq: Once | INTRAMUSCULAR | Status: AC
  Administered 2023-05-22: 50 ug via INTRAVENOUS
  Filled 2023-05-22: qty 2

## 2023-05-22 MED ORDER — IOHEXOL 350 MG/ML SOLN
65.0000 mL | Freq: Once | INTRAVENOUS | Status: AC | PRN
  Administered 2023-05-22: 65 mL via INTRAVENOUS

## 2023-05-22 MED ORDER — CYCLOBENZAPRINE HCL 10 MG PO TABS: 10.0000 mg | ORAL_TABLET | Freq: Two times a day (BID) | ORAL | 0 refills | Status: DC | PRN

## 2023-05-22 MED ORDER — LIDOCAINE 5 % EX PTCH: 1.0000 | MEDICATED_PATCH | Freq: Two times a day (BID) | CUTANEOUS | 0 refills | Status: AC

## 2023-05-22 MED ORDER — LACTATED RINGERS IV BOLUS
1000.0000 mL | Freq: Once | INTRAVENOUS | Status: AC
  Administered 2023-05-22: 1000 mL via INTRAVENOUS

## 2023-05-22 MED ORDER — SODIUM CHLORIDE 0.9 % IV SOLN
8.0000 mg | Freq: Once | INTRAVENOUS | Status: AC
  Administered 2023-05-22: 8 mg via INTRAVENOUS
  Filled 2023-05-22: qty 4

## 2023-05-22 MED ORDER — ONDANSETRON 4 MG PO TBDP: 4.0000 mg | ORAL_TABLET | Freq: Three times a day (TID) | ORAL | 0 refills | Status: DC | PRN

## 2023-05-22 NOTE — MAU Provider Note (Addendum)
None     S [name] is a 32 y.o. G3P2002 reportedly pregnant female at ?[redacted]w[redacted]d based on LMP reported 04/12/23 who presents to MAU today with complaint of acute L sided chest pain similar to prior PE pain for last 2 days.  Patient experienced DVT/PE in G1 and most recently had PP visit 02/2021 in which she was taken off of Lovenox.  Has not been on [facility] since then.  Endorses N/V, reports bilious vomiting, fatigues, lack of energy and dizziness.  Has not had pregnancy confirmed yet this pregnancy.    Pertinent items noted in HPI and remainder of comprehensive ROS otherwise negative.   O BP 101/70 (BP Location: Right Arm)   Pulse 93   Temp 98.3 F (36.8 C) (Oral)   Resp 10   Ht 4\' 11"  (1.499 m)   Wt 60 kg   LMP 04/12/2023   SpO2 100%   BMI 26.72 kg/m  Physical Exam Constitutional:      General: She is in acute distress.     Appearance: She is normal weight. She is not toxic-appearing.  HENT:     Head: Normocephalic and atraumatic.  Cardiovascular:     Rate and Rhythm: Regular rhythm. Tachycardia present.     Heart sounds: Normal heart sounds. No murmur heard.    No friction rub. No gallop.  Pulmonary:     Effort: Tachypnea present. No respiratory distress.     Breath sounds: Normal breath sounds. No stridor.  Chest:     Chest wall: Tenderness present.     Comments: Significantly TTP over entire L chest, non-focal, similar presentation with prior PE Abdominal:     Palpations: Abdomen is soft.     Tenderness: There is no abdominal tenderness.  Musculoskeletal:        General: Normal range of motion.     Cervical back: Normal range of motion.     Right lower leg: No edema.     Left lower leg: No edema.  Skin:    General: Skin is warm and dry.  Neurological:     General: No focal deficit present.     Mental Status: She is alert and oriented to person, place, and time.  Psychiatric:        Mood and Affect: Mood is anxious.      MDM: High MAU Course: [name] is a 32 y.o. [phone] reportedly pregnant female at ?[redacted]w[redacted]d based on LMP reported 04/12/23 with PMHx of PE in prior pregnancy who presents to MAU today with complaint of acute L sided chest pain.  No longer on AC.  Endorses N/V, reports bilious vomiting, fatigues, lack of energy and dizziness.  Has not had pregnancy confirmed yet this pregnancy. Ddx includes but not limited to PE vs MI vs MSK pain / costochondritis. EKG NSR  EKG: normal EKG, normal sinus rhythm, unchanged from previous tracings.  CBC diff = Hgb 13.8, Plt 247 CMP = K 3.3, Glucose 118, Cr 0.82, LFTs wnl  hCG = 45K Ddimer = 0.96 in s/o pregnancy  Trop = 3 PT/INR = wnl BNP = wnl  CXR = neg  CTPE = negative for PE  Transvaginal US = [redacted]w[redacted]d single live intrauterine pregnancy with small subchorionic bleed   1L fluids.  New OB Labs added    A Intercostal muscle pain Neg CTPE, CXR neg, EKG normal, pain improved but worsened with movement. States pain is not neuropathic in nature but did have chickenpox when younger, counseled  on watching for development of shingles rash.  - flexeril PRN  - Zofran PRN - Lidocaine patches PRN  Medical screening exam complete  P Discharge from MAU in stable condition with strict / usual precautions Sent message to [facility] staff 8/13 as patient wants to follow with that clinic.  Follow up within the next 1-2 weeks for ongoing prenatal care  Allergies as of 05/22/2023       Reactions   Morphine And Codeine Anaphylaxis, Swelling, Other (See Comments)   "swells shut" the patient's throat        Medication List     STOP taking these medications    ibuprofen 800 MG tablet Commonly known as: ADVIL       TAKE these medications    albuterol 108 (90 Base) MCG/ACT inhaler Commonly known as: VENTOLIN HFA Inhale 2 puffs into the lungs every 4 (four) hours as needed for wheezing or shortness of breath.   cyclobenzaprine 10 MG tablet Commonly known as: FLEXERIL Take 1 tablet (10 mg  total) by mouth 2 (two) times daily as needed for muscle spasms (headache). What changed: reasons to take this   lidocaine 5 % Commonly known as: Lidoderm Place 1 patch onto the skin every 12 (twelve) hours for 5 days. Remove & Discard patch within 12 hours or as directed by MD   olopatadine 0.1 % ophthalmic solution Commonly known as: Patanol Place 1 drop into the left eye 2 (two) times daily.   ondansetron 4 MG disintegrating tablet Commonly known as: ZOFRAN-ODT Take 1 tablet (4 mg total) by mouth every 8 (eight) hours as needed for nausea or vomiting.        Hessie Dibble, MD 05/22/2023 3:52 PM

## 2023-05-22 NOTE — MAU Note (Signed)
Plan explained to pt, to get urine and check UPT.  Assisting pt  to bathroom, knees became week, WC brought in by 2nd staff member, called NT and for provider.  Pt taken directly to rm 130.  Dr Alvester Morin present.

## 2023-05-22 NOTE — MAU Note (Signed)
Stephen Howrey is a 32 y.o. at here in MAU reporting: always has this vomiting early on.  Has not had preg confirmed yet.has been throwing up bile for the last 24 hrs.  With first preg, had pulmonary embolus; started feeling that same pain 2 days ago. Has been having dizzy spells and no strength or energy. LMP: 04/12/2023  +HPT Onset of complaint: few days ago Pain score: 9 Vitals:   05/22/23 0942 05/22/23 0944  BP:  106/70  Pulse:  (!) 101  Resp:  20  Temp:  98.3 F (36.8 C)  SpO2: 100% 100%      Lab orders placed from triage:  UPT/UA   Was on blood thinners with other pregnancies

## 2023-05-22 NOTE — Discharge Instructions (Signed)
Given print off for intercostal rib exercises.

## 2023-05-23 ENCOUNTER — Ambulatory Visit: Payer: Medicaid Other | Admitting: Nurse Practitioner

## 2023-05-23 NOTE — Progress Notes (Deleted)
Tollie Eth, DNP, AGNP-c [facility] [address] [place], [place] [address] Main Office [phone]   New patient visit   Patient: Carmen [name]   DOB: 01-11-1991   33 y.o. Female  MRN: [id number] Visit Date: 05/23/2023  Patient Care Team: Patient, No Pcp Per as PCP - General (General Practice)  There were no vitals filed for this visit. There is no height or weight on file to calculate BMI.   Today's healthcare provider: Tollie Eth, NP   No chief complaint on file.  Subjective    Carmen [name] is a 32 y.o. female who presents today as a new patient to establish care.    Patient endorses the following concerns presently:   History reviewed and reveals the following: Past Medical History:  Diagnosis Date   Anemia    Asthma    Headache    Kidney stones    Panic attacks    Pulmonary embolism, antepartum 02/08/2018   Indications: PE diagnosed 5/2@ [facility].  Left side, lower lobe;   Admitted 5/2-5/6 - heparin drip>lovenox 60BID.  ED evaluation 5/8 2/2 persistent SOB - negative DVT, nml ECG Anticoagulation goal is therapeutic  Therapeutic dose is Lovenox 1mg /kg SQ Q12--currently on 60 mg BID  Monitoring is indicated for patients on therapeutic anticoagulation  Will check Anti-Xa level 4-6 hours after 3rd dose Q trime   UTI (urinary tract infection)    Past Surgical History:  Procedure Laterality Date   NO PAST SURGERIES     Family Status  Relation Name Status   Mother  Alive   Father  Alive   Sister  (Not Specified)  No partnership data on file   Family History  Problem Relation Age of Onset   Pulmonary embolism Mother    Healthy Father    Ulcers Sister    Social History   Socioeconomic History   Marital status: Single    Spouse name: Not on file   Number of children: Not on file   Years of education: Not on file   Highest education level: Not on file  Occupational History   Not on file  Tobacco Use   Smoking status: Former     Types: Cigarettes   Smokeless tobacco: Never   Tobacco comments:    Stopped 04/2023  Vaping Use   Vaping status: Never Used  Substance and Sexual Activity   Alcohol use: Not Currently   Drug use: Not Currently    Types: Marijuana    Comment: end of July/ Aug, "occ"   Sexual activity: Not Currently  Other Topics Concern   Not on file  Social History Narrative   Not on file   Social Determinants of Health   Financial Resource Strain: Medium Risk (08/19/2018)   Overall Financial Resource Strain (CARDIA)    Difficulty of Paying Living Expenses: Somewhat hard  Food Insecurity: No Food Insecurity (12/22/2020)   Hunger Vital Sign    Worried About Running Out of Food in the Last Year: Never true    Ran Out of Food in the Last Year: Never true  Transportation Needs: No Transportation Needs (12/22/2020)   PRAPARE - Administrator, Civil Service (Medical): No    Lack of Transportation (Non-Medical): No  Physical Activity: Not on file  Stress: No Stress Concern Present (08/19/2018)   [employer] of Occupational Health - Occupational Stress Questionnaire    Feeling of Stress : Only a little  Social Connections: Not on file  Outpatient Medications Prior to Visit  Medication Sig   albuterol (VENTOLIN HFA) 108 (90 Base) MCG/ACT inhaler Inhale 2 puffs into the lungs every 4 (four) hours as needed for wheezing or shortness of breath.   cyclobenzaprine (FLEXERIL) 10 MG tablet Take 1 tablet (10 mg total) by mouth 2 (two) times daily as needed for muscle spasms (headache).   lidocaine (LIDODERM) 5 % Place 1 patch onto the skin every 12 (twelve) hours for 5 days. Remove & Discard patch within 12 hours or as directed by MD   olopatadine (PATANOL) 0.1 % ophthalmic solution Place 1 drop into the left eye 2 (two) times daily.   ondansetron (ZOFRAN-ODT) 4 MG disintegrating tablet Take 1 tablet (4 mg total) by mouth every 8 (eight) hours as needed for nausea or vomiting.   No  facility-administered medications prior to visit.   Allergies  Allergen Reactions   Morphine And Codeine Anaphylaxis, Swelling and Other (See Comments)    "swells shut" the patient's throat   Immunization History  Administered Date(s) Administered   Tdap 06/26/2018, 11/08/2020    Health Maintenance Due Health Maintenance Topics with due status: Overdue     Topic Date Due   PAP SMEAR-Modifier 12/07/2020   COVID-19 Vaccine Never done   Health Maintenance Topics with due status: Due On     Topic Date Due   INFLUENZA VACCINE 05/10/2023    Review of Systems All review of systems negative except what is listed in the HPI   Objective    LMP 04/12/2023  Physical Exam  No results found for any visits on 05/23/23.  Assessment & Plan      Problem List Items Addressed This Visit   None    No follow-ups on file.      , Sung Amabile, NP, DNP, AGNP-C [facility]

## 2023-05-26 ENCOUNTER — Encounter (HOSPITAL_COMMUNITY): Payer: Self-pay | Admitting: Obstetrics and Gynecology

## 2023-05-26 ENCOUNTER — Observation Stay (HOSPITAL_BASED_OUTPATIENT_CLINIC_OR_DEPARTMENT_OTHER): Payer: Medicaid Other | Admitting: Anesthesiology

## 2023-05-26 ENCOUNTER — Other Ambulatory Visit: Payer: Self-pay

## 2023-05-26 ENCOUNTER — Observation Stay (HOSPITAL_COMMUNITY)
Admission: AD | Admit: 2023-05-26 | Discharge: 2023-05-27 | Disposition: A | Discharge status: Home / Self Care | Payer: Medicaid Other | Attending: Obstetrics and Gynecology | Admitting: Obstetrics and Gynecology

## 2023-05-26 ENCOUNTER — Observation Stay (HOSPITAL_COMMUNITY): Payer: Medicaid Other | Admitting: Anesthesiology

## 2023-05-26 ENCOUNTER — Encounter (HOSPITAL_COMMUNITY)
Admission: AD | Disposition: A | Discharge status: Home / Self Care | Payer: Self-pay | Source: Home / Self Care | Attending: Obstetrics and Gynecology

## 2023-05-26 DIAGNOSIS — Z87891 Personal history of nicotine dependence: Secondary | ICD-10-CM | POA: Insufficient documentation

## 2023-05-26 DIAGNOSIS — Z86711 Personal history of pulmonary embolism: Secondary | ICD-10-CM | POA: Diagnosis not present

## 2023-05-26 DIAGNOSIS — J45909 Unspecified asthma, uncomplicated: Secondary | ICD-10-CM | POA: Insufficient documentation

## 2023-05-26 DIAGNOSIS — O034 Incomplete spontaneous abortion without complication: Principal | ICD-10-CM | POA: Diagnosis present

## 2023-05-26 DIAGNOSIS — Z3A01 Less than 8 weeks gestation of pregnancy: Secondary | ICD-10-CM

## 2023-05-26 DIAGNOSIS — Z9889 Other specified postprocedural states: Secondary | ICD-10-CM

## 2023-05-26 DIAGNOSIS — N939 Abnormal uterine and vaginal bleeding, unspecified: Secondary | ICD-10-CM | POA: Insufficient documentation

## 2023-05-26 DIAGNOSIS — Z79899 Other long term (current) drug therapy: Secondary | ICD-10-CM | POA: Diagnosis not present

## 2023-05-26 DIAGNOSIS — O021 Missed abortion: Secondary | ICD-10-CM | POA: Diagnosis not present

## 2023-05-26 HISTORY — PX: DILATION AND CURETTAGE OF UTERUS: SHX78

## 2023-05-26 LAB — CBC
HCT: 39.5 % (ref 36.0–46.0)
Hemoglobin: 13.2 g/dL (ref 12.0–15.0)
MCH: 29.1 pg (ref 26.0–34.0)
MCHC: 33.4 g/dL (ref 30.0–36.0)
MCV: 87.2 fL (ref 80.0–100.0)
Platelets: 256 10*3/uL (ref 150–400)
RBC: 4.53 MIL/uL (ref 3.87–5.11)
RDW: 13.3 % (ref 11.5–15.5)
WBC: 10.4 10*3/uL (ref 4.0–10.5)
nRBC: 0 % (ref 0.0–0.2)

## 2023-05-26 LAB — RAPID URINE DRUG SCREEN, HOSP PERFORMED
Amphetamines: NOT DETECTED
Barbiturates: NOT DETECTED
Benzodiazepines: NOT DETECTED
Cocaine: NOT DETECTED
Opiates: NOT DETECTED
Tetrahydrocannabinol: POSITIVE — AB

## 2023-05-26 LAB — TYPE AND SCREEN
ABO/RH(D): O POS
Antibody Screen: NEGATIVE

## 2023-05-26 LAB — GLUCOSE, CAPILLARY: Glucose-Capillary: 104 mg/dL — ABNORMAL HIGH (ref 70–99)

## 2023-05-26 LAB — HCG, QUANTITATIVE, PREGNANCY: hCG, Beta Chain, Quant, S: 78402 m[IU]/mL — ABNORMAL HIGH (ref <5)

## 2023-05-26 SURGERY — DILATION AND CURETTAGE
Anesthesia: General

## 2023-05-26 MED ORDER — CHLORHEXIDINE GLUCONATE 0.12 % MT SOLN
OROMUCOSAL | Status: AC
  Administered 2023-05-26: 15 mL via OROMUCOSAL
  Filled 2023-05-26: qty 15

## 2023-05-26 MED ORDER — LIDOCAINE HCL 1 % IJ SOLN
INTRAMUSCULAR | Status: AC
  Filled 2023-05-26: qty 20

## 2023-05-26 MED ORDER — ORAL CARE MOUTH RINSE: 15.0000 mL | Freq: Once | OROMUCOSAL | Status: AC

## 2023-05-26 MED ORDER — KETOROLAC TROMETHAMINE 30 MG/ML IJ SOLN
30.0000 mg | Freq: Four times a day (QID) | INTRAMUSCULAR | Status: AC
  Administered 2023-05-26 – 2023-05-27 (×2): 30 mg via INTRAVENOUS
  Filled 2023-05-26 (×2): qty 1

## 2023-05-26 MED ORDER — ONDANSETRON HCL 4 MG/2ML IJ SOLN
INTRAMUSCULAR | Status: AC
  Filled 2023-05-26: qty 2

## 2023-05-26 MED ORDER — OXYCODONE HCL 5 MG PO TABS
5.0000 mg | ORAL_TABLET | Freq: Three times a day (TID) | ORAL | Status: DC | PRN
  Administered 2023-05-26 – 2023-05-27 (×2): 5 mg via ORAL
  Filled 2023-05-26 (×2): qty 1

## 2023-05-26 MED ORDER — CHLORHEXIDINE GLUCONATE 0.12 % MT SOLN: 15.0000 mL | Freq: Once | OROMUCOSAL | Status: AC

## 2023-05-26 MED ORDER — SIMETHICONE 80 MG PO CHEW: 80.0000 mg | CHEWABLE_TABLET | Freq: Four times a day (QID) | ORAL | Status: DC | PRN

## 2023-05-26 MED ORDER — LIDOCAINE 2% (20 MG/ML) 5 ML SYRINGE
INTRAMUSCULAR | Status: DC | PRN
  Administered 2023-05-26: 60 mg via INTRAVENOUS

## 2023-05-26 MED ORDER — PROPOFOL 10 MG/ML IV BOLUS
INTRAVENOUS | Status: DC | PRN
  Administered 2023-05-26: 130 mg via INTRAVENOUS

## 2023-05-26 MED ORDER — PROPOFOL 10 MG/ML IV BOLUS
INTRAVENOUS | Status: AC
  Filled 2023-05-26: qty 20

## 2023-05-26 MED ORDER — ONDANSETRON HCL 4 MG/2ML IJ SOLN
INTRAMUSCULAR | Status: DC | PRN
  Administered 2023-05-26: 4 mg via INTRAVENOUS

## 2023-05-26 MED ORDER — MIDAZOLAM HCL 2 MG/2ML IJ SOLN
INTRAMUSCULAR | Status: AC
  Filled 2023-05-26: qty 2

## 2023-05-26 MED ORDER — LIDOCAINE HCL (PF) 1 % IJ SOLN
INTRAMUSCULAR | Status: AC
  Filled 2023-05-26: qty 30

## 2023-05-26 MED ORDER — ONDANSETRON HCL 4 MG/2ML IJ SOLN: 4.0000 mg | Freq: Four times a day (QID) | INTRAMUSCULAR | Status: DC | PRN

## 2023-05-26 MED ORDER — LACTATED RINGERS IV SOLN: INTRAVENOUS | Status: DC

## 2023-05-26 MED ORDER — DOXYCYCLINE HYCLATE 100 MG IV SOLR
200.0000 mg | Freq: Once | INTRAVENOUS | Status: AC
  Administered 2023-05-26: 200 mg via INTRAVENOUS
  Filled 2023-05-26: qty 200

## 2023-05-26 MED ORDER — FENTANYL CITRATE (PF) 250 MCG/5ML IJ SOLN
INTRAMUSCULAR | Status: DC | PRN
  Administered 2023-05-26: 100 ug via INTRAVENOUS
  Administered 2023-05-26: 50 ug via INTRAVENOUS

## 2023-05-26 MED ORDER — DEXAMETHASONE SODIUM PHOSPHATE 10 MG/ML IJ SOLN
INTRAMUSCULAR | Status: DC | PRN
  Administered 2023-05-26: 10 mg via INTRAVENOUS

## 2023-05-26 MED ORDER — SUCCINYLCHOLINE CHLORIDE 200 MG/10ML IV SOSY
PREFILLED_SYRINGE | INTRAVENOUS | Status: DC | PRN
  Administered 2023-05-26: 80 mg via INTRAVENOUS

## 2023-05-26 MED ORDER — ENOXAPARIN SODIUM 40 MG/0.4ML IJ SOSY: 40.0000 mg | PREFILLED_SYRINGE | INTRAMUSCULAR | Status: DC

## 2023-05-26 MED ORDER — LIDOCAINE HCL 1 % IJ SOLN
INTRAMUSCULAR | Status: DC | PRN
  Administered 2023-05-26: 20 mL

## 2023-05-26 MED ORDER — LACTATED RINGERS IV BOLUS
500.0000 mL | Freq: Once | INTRAVENOUS | Status: AC
  Administered 2023-05-26: 500 mL via INTRAVENOUS

## 2023-05-26 MED ORDER — FENTANYL CITRATE (PF) 250 MCG/5ML IJ SOLN
INTRAMUSCULAR | Status: AC
  Filled 2023-05-26: qty 5

## 2023-05-26 MED ORDER — LACTATED RINGERS IV SOLN: INTRAVENOUS | Status: AC

## 2023-05-26 MED ORDER — ONDANSETRON HCL 4 MG PO TABS: 4.0000 mg | ORAL_TABLET | Freq: Four times a day (QID) | ORAL | Status: DC | PRN

## 2023-05-26 MED ORDER — HYDROMORPHONE HCL 1 MG/ML IJ SOLN
1.0000 mg | INTRAMUSCULAR | Status: DC | PRN
  Administered 2023-05-26: 1 mg via INTRAVENOUS
  Filled 2023-05-26: qty 1

## 2023-05-26 MED ORDER — MIDAZOLAM HCL 2 MG/2ML IJ SOLN
INTRAMUSCULAR | Status: DC | PRN
  Administered 2023-05-26: 2 mg via INTRAVENOUS

## 2023-05-26 MED ORDER — DEXAMETHASONE SODIUM PHOSPHATE 10 MG/ML IJ SOLN
INTRAMUSCULAR | Status: AC
  Filled 2023-05-26: qty 1

## 2023-05-26 SURGICAL SUPPLY — 22 items
CATH ROBINSON RED A/P 16FR (CATHETERS) IMPLANT
FILTER UTR ASPR ASSEMBLY (MISCELLANEOUS) ×1 IMPLANT
GLOVE BIOGEL PI IND STRL 7.0 (GLOVE) ×1 IMPLANT
GLOVE BIOGEL PI IND STRL 7.5 (GLOVE) ×1 IMPLANT
GLOVE NEODERM STER SZ 7 (GLOVE) ×1 IMPLANT
GOWN STRL REUS W/ TWL LRG LVL3 (GOWN DISPOSABLE) ×1 IMPLANT
GOWN STRL REUS W/ TWL XL LVL3 (GOWN DISPOSABLE) ×1 IMPLANT
GOWN STRL REUS W/TWL LRG LVL3 (GOWN DISPOSABLE) ×1
GOWN STRL REUS W/TWL XL LVL3 (GOWN DISPOSABLE) ×1
HOSE CONNECTING 18IN BERKELEY (TUBING) ×1 IMPLANT
KIT BERKELEY 1ST TRI 3/8 NO TR (MISCELLANEOUS) ×1 IMPLANT
KIT BERKELEY 1ST TRIMESTER 3/8 (MISCELLANEOUS) ×1 IMPLANT
NS IRRIG 1000ML POUR BTL (IV SOLUTION) ×1 IMPLANT
PACK VAGINAL MINOR WOMEN LF (CUSTOM PROCEDURE TRAY) ×1 IMPLANT
PAD OB MATERNITY 4.3X12.25 (PERSONAL CARE ITEMS) ×1 IMPLANT
SET BERKELEY SUCTION TUBING (SUCTIONS) ×1 IMPLANT
SPIKE FLUID TRANSFER (MISCELLANEOUS) ×1 IMPLANT
TOWEL GREEN STERILE FF (TOWEL DISPOSABLE) ×2 IMPLANT
VACURETTE 10 RIGID CVD (CANNULA) IMPLANT
VACURETTE 7MM CVD STRL WRAP (CANNULA) IMPLANT
VACURETTE 8 RIGID CVD (CANNULA) IMPLANT
VACURETTE 9 RIGID CVD (CANNULA) IMPLANT

## 2023-05-26 NOTE — MAU Note (Signed)
.  Mckynleigh Rim is a 32 y.o. at [redacted]w[redacted]d here in MAU reporting: Security called out to notify MAU staff that someone was on the ground in the parking garage in labor. This RN, Dr. Earlene Plater, and Lodema Hong, CNM went out the parking garage to find the patient lying on the ground with no response to verbal stimuli. Patient responded to sternal rub. Staff was able to get patient into a wheel chair and transported her to MAU. Patient has a moderate amount of vaginal bleeding and lower abdominal pain accompanied by nausea and vomiting.   Pain score: 10

## 2023-05-26 NOTE — Op Note (Signed)
Operative Note   05/26/2023  PRE-OP DIAGNOSIS: incomplete miscarriage   POST-OP DIAGNOSIS: same  SURGEON: Surgeons and Role:    [place] Bing, MD - Primary  ASSISTANT: none  PROCEDURE:  Suction dilation and curettage  ANESTHESIA: General and paracervical block  ESTIMATED BLOOD LOSS: 25mL  DRAINS: none  TOTAL IV FLUIDS: per anesthesia note  SPECIMENS: products of conception to pathology  VTE PROPHYLAXIS: SCDs to the bilateral lower extremities  ANTIBIOTICS: Doxycycline 200mg  IV x 1 pre op  COMPLICATIONS: none  DISPOSITION: PACU - hemodynamically stable.  CONDITION: stable  BLOOD TYPE: O POS. Rhogam given:not applicable  FINDINGS: Exam under anesthesia revealed  8 week sized uterus with no masses and bilateral adnexa without masses or fullness. Large amount of necrotic appearing products of conception were seen, with gritty texture in all four quadrants.  PROCEDURE IN DETAIL:  After informed consent was obtained, the patient was taken to the operating room where anesthesia was obtained without difficulty. The patient was positioned in the dorsal lithotomy position in [place] stirrups. The patient was examined under anesthesia, with the above noted findings.  The bi-valved speculum was placed inside the patient's vagina, and the the anterior lip of the cervix was seen and grasped with the tenaculum.  A paracervical block was achieved with 20mL of 1% lidocaine and the cervix was already dilated to a #10 cannula. The suction was then calibrated to and connected to the number 10 cannula, which was then introduced with the above noted findings. A gentle curettage was done at the end and yield no products of conception.   The suction was then done one more time to remove any remaining curettage material.   Excellent hemostasis was noted, and all instruments were removed, with excellent hemostasis noted throughout.  She was then taken out of dorsal lithotomy. The patient  tolerated the procedure well.  Sponge, lap and instrument counts were correct x2.  The patient was taken to recovery room in excellent condition.  [name] MD Attending [facility])

## 2023-05-26 NOTE — MAU Provider Note (Signed)
MAU Note Patient able to respond with questions with commands. ?unresponsive but awake with chest rub and with ammonia. She states she has pelvic pain and feels like something is coming out. RN notes just small amount of blood when she used the bed pan and I saw scant old blood on pad when patient came in. VS normal and stable and CBG pending  I did a TAUS and then I looked at her prior u/s a few days. I think the subchorionic hemorrhage does look bigger but GS is not in the lower uterine segment. I didn't see a definite heartbeat but the area the prior u/s was saying had a heartbeat is still present. I reviewed her prior u/s and I'm not sure I see a definite heartbeat.  NPO, f/u labs. May need d&c due to maternal discomfort; pt is clinically stable.    [name] MD Attending [facility] (Faculty Practice) 05/26/2023 Time: [phone]

## 2023-05-26 NOTE — Anesthesia Postprocedure Evaluation (Signed)
Anesthesia Post Note  Patient: Adysyn Delacueva  Procedure(s) Performed: SUCTION DILATATION AND CURETTAGE     Patient location during evaluation: PACU Anesthesia Type: General Level of consciousness: awake and alert Pain management: pain level controlled Vital Signs Assessment: post-procedure vital signs reviewed and stable Respiratory status: spontaneous breathing, nonlabored ventilation, respiratory function stable and patient connected to nasal cannula oxygen Cardiovascular status: blood pressure returned to baseline and stable Postop Assessment: no apparent nausea or vomiting Anesthetic complications: no   No notable events documented.  Last Vitals:  Vitals:   05/26/23 1800 05/26/23 1838  BP: 120/84 135/75  Pulse: 68 68  Resp: 17 17  Temp: (!) 36.1 C 36.5 C  SpO2: 100% 100%    Last Pain:  Vitals:   05/26/23 1839  TempSrc:   PainSc: 0-No pain                 Collene Schlichter

## 2023-05-26 NOTE — Anesthesia Preprocedure Evaluation (Addendum)
Anesthesia Evaluation  Patient identified by MRN, date of birth, ID band Patient awake    Reviewed: Allergy & Precautions, NPO status , Patient's Chart, lab work & pertinent test results  Airway Mallampati: II  TM Distance: >3 FB Neck ROM: Full    Dental  (+) Teeth Intact, Dental Advisory Given Upper and lower braces :   Pulmonary asthma , former smoker, PE (2019)   Pulmonary exam normal breath sounds clear to auscultation       Cardiovascular negative cardio ROS Normal cardiovascular exam Rhythm:Regular Rate:Normal     Neuro/Psych  Headaches PSYCHIATRIC DISORDERS Anxiety        GI/Hepatic negative GI ROS, Neg liver ROS,,,  Endo/Other  negative endocrine ROS    Renal/GU negative Renal ROS     Musculoskeletal negative musculoskeletal ROS (+)    Abdominal   Peds  Hematology negative hematology ROS (+)   Anesthesia Other Findings Day of surgery medications reviewed with the patient.  Reproductive/Obstetrics  Pelvic Pain   32 y.o. at [redacted]w[redacted]d                             Anesthesia Physical Anesthesia Plan  ASA: 2 and emergent  Anesthesia Plan: General   Post-op Pain Management: Ofirmev IV (intra-op)*   Induction: Intravenous and Rapid sequence  PONV Risk Score and Plan: 4 or greater and Scopolamine patch - Pre-op, Midazolam, Dexamethasone and Ondansetron  Airway Management Planned: Oral ETT  Additional Equipment:   Intra-op Plan:   Post-operative Plan: Extubation in OR  Informed Consent: I have reviewed the patients History and Physical, chart, labs and discussed the procedure including the risks, benefits and alternatives for the proposed anesthesia with the patient or authorized representative who has indicated his/her understanding and acceptance.     Dental advisory given  Plan Discussed with: CRNA  Anesthesia Plan Comments:         Anesthesia Quick Evaluation

## 2023-05-26 NOTE — MAU Note (Signed)
Pt resting. V/SS. Having periods of cramping. Vag bleeding minimal.

## 2023-05-26 NOTE — Transfer of Care (Signed)
Immediate Anesthesia Transfer of Care Note  Patient: Carmen [name]  Procedure(s) Performed: SUCTION DILATATION AND CURETTAGE  Patient Location: PACU  Anesthesia Type:General  Level of Consciousness: awake and sedated  Airway & Oxygen Therapy: Patient Spontanous Breathing and Patient connected to face mask oxygen  Post-op Assessment: Report given to RN and Post -op Vital signs reviewed and stable  Post vital signs: Reviewed and stable  Last Vitals:  Vitals Value Taken Time  BP    Temp    Pulse    Resp    SpO2      Last Pain:  Vitals:   05/26/23 1545  PainSc: 10-Worst pain ever         Complications: No notable events documented.

## 2023-05-26 NOTE — Anesthesia Procedure Notes (Signed)
Procedure Name: Intubation Date/Time: 05/26/2023 4:50 PM  Performed by: Armanda Heritage, CRNAPre-anesthesia Checklist: Patient identified, Emergency Drugs available, Suction available, Patient being monitored and Timeout performed Patient Re-evaluated:Patient Re-evaluated prior to induction Oxygen Delivery Method: Circle system utilized Preoxygenation: Pre-oxygenation with 100% oxygen Induction Type: IV induction Laryngoscope Size: Mac and 3 Grade View: Grade III Tube type: Oral Tube size: 7.0 mm Number of attempts: 2 Airway Equipment and Method: Stylet Placement Confirmation: ETT inserted through vocal cords under direct vision and positive ETCO2 Secured at: 22 cm Dental Injury: Teeth and Oropharynx as per pre-operative assessment

## 2023-05-26 NOTE — MAU Note (Signed)
Dr. Vergie Living at bedside with U/S . Unable to see Fetus as before. Pelvic exam performed and removed some POC. Decision to do a D&C. Pt consents to POC.

## 2023-05-26 NOTE — MAU Provider Note (Signed)
MAU Note Quant went up well and cbc wnl. Patient still endorsing pain and feels like something is coming out. Repeat u/s doesn't show sac anymore but with thickened stripe. Sterile spec exam done and some products of conception at the os, bleeding scant and unable to tease anything more than a few small pieces of tissue out of the os. I told her I recommend suction d&c, which she is amenable to. Plan to keep overnight.  [name] MD Attending [facility] (Faculty Practice) 05/26/2023 Time: [phone]

## 2023-05-26 NOTE — MAU Note (Addendum)
Pt had been moaning in pain on a bedpan because she stated she needed to urine. While on bed pain stated she felt like something was coming out. Small amount of blood and urine noted no clots or tissue. Pt was crying and upset  consoled her then she stopped and became unresponsive for about 20 seconds. Called Provider to bedside. Amonia inhalent used and pt became responsive. V/ss stable during the incident. No heavy bleeding noted.

## 2023-05-26 NOTE — H&P (Signed)
Obstetrics & Gynecology H&P   Date of Admission: 05/26/2023   Requesting Provider: MAU  Primary OBGYN: [facility] Primary Care Provider: Patient, No Pcp Per  Reason for Admission: incomplete miscarriage, pain, VB  History of Present Illness: Ms. Bizzle is a 32 y.o. [id number] (Patient's last menstrual period was 04/12/2023.), with the above CC. PMHx is significant for h/o PE.    Patient seen on 8/13 for chest pain, with negative labs, CT PE and early IUP with FHR in the 90s but with some bleeding around the pregnancy. Patient presented to MAU with abdominal pain, VB and feeling like she was having something come out; she was initially found in the parking lot after having driven herself her with pain and discomfort. MAU staff notified and went to retrieve patient in wheelchair.  In MAU, VB not heavy and initial u/s showed ? Increased blood around the pregnancy but a sac still in the normal position. Labs ordered and CBC wnl with quant up from 45k to 78k. Patient seemed initially okay for discharge but was endorsing increased pain and pressure so repeat bedside u/s done and it showed no more sac with POCs in the endometrium. Spec exam did show some POCs but was scant; VB small. Decision made to proceed with suction d&c for incomplete miscarriage.   ROS: A 12-point review of systems was performed and negative, except as stated in the above HPI.  OBGYN History: As per HPI. OB History  Gravida Para Term Preterm AB Living  3 2 2  0 0 2  SAB IAB Ectopic Multiple Live Births  0 0 0 0 2    # Outcome Date GA Lbr Len/2nd Weight Sex Type Anes PTL Lv  3 Current           2 Term 01/15/21 [redacted]w[redacted]d   M Vag-Spont  N LIV  1 Term 08/29/18 [redacted]w[redacted]d 35:47 / 00:29 3460 g F Vag-Spont EPI  LIV     Past Medical History: Past Medical History:  Diagnosis Date   Anemia    Asthma    Headache    Kidney stones    Panic attacks    Pulmonary embolism, antepartum 02/08/2018    Indications: PE diagnosed 5/2@ [facility].  Left side, lower lobe;   Admitted 5/2-5/6 - heparin drip>lovenox 60BID.  ED evaluation 5/8 2/2 persistent SOB - negative DVT, nml ECG Anticoagulation goal is therapeutic  Therapeutic dose is Lovenox 1mg /kg SQ Q12--currently on 60 mg BID  Monitoring is indicated for patients on therapeutic anticoagulation  Will check Anti-Xa level 4-6 hours after 3rd dose Q trime   UTI (urinary tract infection)     Past Surgical History: Past Surgical History:  Procedure Laterality Date   NO PAST SURGERIES      Family History:  Family History  Problem Relation Age of Onset   Pulmonary embolism Mother    Healthy Father    Ulcers Sister     Social History:  Social History   Socioeconomic History   Marital status: Single    Spouse name: Not on file   Number of children: Not on file   Years of education: Not on file   Highest education level: Not on file  Occupational History   Not on file  Tobacco Use   Smoking status: Former    Types: Cigarettes   Smokeless tobacco: Never   Tobacco comments:    Stopped 04/2023  Vaping Use   Vaping status: Never Used  Substance and Sexual  Activity   Alcohol use: Not Currently   Drug use: Not Currently    Types: Marijuana    Comment: end of July/early Aug, "occ"   Sexual activity: Not Currently  Other Topics Concern   Not on file  Social History Narrative   Not on file   Social Determinants of Health   Financial Resource Strain: Medium Risk (08/19/2018)   Overall Financial Resource Strain (CARDIA)    Difficulty of Paying Living Expenses: Somewhat hard  Food Insecurity: No Food Insecurity (12/22/2020)   Hunger Vital Sign    Worried About Running Out of Food in the Last Year: Never true    Ran Out of Food in the Last Year: Never true  Transportation Needs: No Transportation Needs (12/22/2020)   PRAPARE - Administrator, Civil Service (Medical): No    Lack of Transportation (Non-Medical): No  Physical  Activity: Not on file  Stress: No Stress Concern Present (08/19/2018)   [employer] of Occupational Health - Occupational Stress Questionnaire    Feeling of Stress : Only a little  Social Connections: Not on file  Intimate Partner Violence: Not At Risk (08/19/2018)   Humiliation, Afraid, Rape, and Kick questionnaire    Fear of Current or Ex-Partner: No    Emotionally Abused: No    Physically Abused: No    Sexually Abused: No    Allergy: Allergies  Allergen Reactions   Morphine And Codeine Anaphylaxis, Swelling and Other (See Comments)    "swells shut" the patient's throat    Current Outpatient Medications: Medications Prior to Admission  Medication Sig Dispense Refill Last Dose   albuterol (VENTOLIN HFA) 108 (90 Base) MCG/ACT inhaler Inhale 2 puffs into the lungs every 4 (four) hours as needed for wheezing or shortness of breath. 18 g 0    cyclobenzaprine (FLEXERIL) 10 MG tablet Take 1 tablet (10 mg total) by mouth 2 (two) times daily as needed for muscle spasms (headache). 30 tablet 0    lidocaine (LIDODERM) 5 % Place 1 patch onto the skin every 12 (twelve) hours for 5 days. Remove & Discard patch within 12 hours or as directed by MD 10 patch 0    olopatadine (PATANOL) 0.1 % ophthalmic solution Place 1 drop into the left eye 2 (two) times daily. 5 mL 12    ondansetron (ZOFRAN-ODT) 4 MG disintegrating tablet Take 1 tablet (4 mg total) by mouth every 8 (eight) hours as needed for nausea or vomiting. 20 tablet 0      Hospital Medications: Current Facility-Administered Medications  Medication Dose Route Frequency Provider Last Rate Last Admin   doxycycline (VIBRAMYCIN) 200 mg in dextrose 5 % 250 mL IVPB  200 mg Intravenous Once [place] Bing, MD       [facility] Hold] HYDROmorphone (DILAUDID) injection 1 mg  1 mg Intravenous Q3H PRN [place] Bing, MD   1 mg at 05/26/23 1543   lactated ringers infusion   Intravenous Continuous [place] Bing, MD       lactated ringers  infusion   Intravenous Continuous Collene Schlichter, MD 10 mL/hr at 05/26/23 1632 New Bag at 05/26/23 1632     Physical Exam:   Current Vital Signs 24h Vital Sign Ranges  T 98.4 F (36.9 C) Temp  Avg: 98.4 F (36.9 C)  Min: 98.4 F (36.9 C)  Max: 98.4 F (36.9 C)  BP 129/70 BP  Min: 101/78  Max: 129/70  HR 76 Pulse  Avg: 84.9  Min: 75  Max: 110  RR 16 Resp  Avg: 15.6  Min: 15  Max: 16  SaO2 99 %   SpO2  Avg: 98.7 %  Min: 98 %  Max: 100 %       24 Hour I/O Current Shift I/O  Time Ins Outs No intake/output data recorded. No intake/output data recorded.    Body mass index is 26.71 kg/m. General appearance: Well nourished, well developed female in no acute distress.  Neck:  Supple, normal appearance, and no thyromegaly  Cardiovascular: S1, S2 normal, no murmur, rub or gallop, regular rate and rhythm Respiratory:  Clear to auscultation bilateral. Normal respiratory effort Abdomen: soft, ND, mildly uncomfortable in suprapubic area Neuro/Psych:  Normal mood and affect.  Skin:  Warm and dry.  Extremities: no clubbing, cyanosis, or edema.  Lymphatic:  No inguinal lymphadenopathy.   Pelvic exam: is not limited by body habitus EGBUS: within normal limits, Vagina: within normal limits and with scant old blood with some scant POCs in vault. Cervix: with scant POCs at os, teased out and nothing else came out   Laboratory: As per HPI Recent Labs  Lab 05/22/23 1009 05/26/23 1420  WBC 7.4 10.4  HGB 13.8 13.2  HCT 41.8 39.5  PLT 247 256   Recent Labs  Lab 05/22/23 1009  NA 135  K 3.3*  CL 99  CO2 23  BUN 12  CREATININE 0.82  CALCIUM 9.9  PROT 8.6*  BILITOT 0.8  ALKPHOS 72  ALT 15  AST 19  GLUCOSE 118*   Recent Labs  Lab 05/22/23 1009  INR 1.1   Recent Labs  Lab 05/26/23 1407  ABORH O POS    Imaging:  As per hpi  Assessment: Ms. Sniffen is a 33 y.o. [phone] with incomplete miscarriage; pt stable  Plan: Pt consented for OR. Plan to stay overnight. Will  check up on in a few hours and if she's okay to go home, she can; she doesn't have a support person though with her grandmother watching her kids right now.   Total time taking care of the patient was 60 minutes, with greater than 50% of the time spent in face to face interaction with the patient.  [name] MD Attending Center for [facility])

## 2023-05-27 ENCOUNTER — Encounter: Payer: Self-pay | Admitting: Obstetrics and Gynecology

## 2023-05-27 ENCOUNTER — Encounter (HOSPITAL_COMMUNITY): Payer: Self-pay | Admitting: Obstetrics and Gynecology

## 2023-05-27 DIAGNOSIS — O034 Incomplete spontaneous abortion without complication: Secondary | ICD-10-CM | POA: Diagnosis not present

## 2023-05-27 MED ORDER — MENTHOL 3 MG MT LOZG
1.0000 | LOZENGE | OROMUCOSAL | Status: DC | PRN
  Administered 2023-05-27: 3 mg via ORAL
  Filled 2023-05-27: qty 9

## 2023-05-27 MED ORDER — IBUPROFEN 200 MG PO TABS: 600.0000 mg | ORAL_TABLET | Freq: Four times a day (QID) | ORAL | Status: DC | PRN

## 2023-05-27 NOTE — Discharge Instructions (Signed)
   We will discuss your surgery once again in detail at your post-op visit in two to four weeks. If you haven't already done so, please call to make your appointment as soon as possible.  Dilation and Curettage or Vacuum Curettage, Care After These instructions give you information on caring for yourself after your procedure. Your doctor may also give you more specific instructions. Call your doctor if you have any problems or questions after your procedure. HOME CARE Do not drive for 24 hours. Wait 1 week before doing any activities that wear you out. Do not stand for a long time. Limit stair climbing to once or twice a day. Rest often. Continue with your usual diet. Drink enough fluids to keep your pee (urine) clear or pale yellow. If you have a hard time pooping (constipation), you may: Take a medicine to help you go poop (laxative) as told by your doctor. Eat more fruit and bran. Drink more fluids. Take showers, not baths, for as long as told by your doctor. Do not swim or use a hot tub until your doctor says it is okay. Have someone with you for 1day after the procedure. Do not douche, use tampons, or have sex (intercourse) until seen by your doctor Only take medicines as told by your doctor. Do not take aspirin. It can cause bleeding. Keep all doctor visits. GET HELP IF: You have cramps or pain not helped by medicine. You have new pain in the belly (abdomen). You have a bad smelling fluid coming from your vagina. You have a rash. You have problems with any medicine. GET HELP RIGHT AWAY IF:  You start to bleed more than a regular period. You have a fever. You have chest pain. You have trouble breathing. You feel dizzy or feel like passing out (fainting). You pass out. You have pain in the tops of your shoulders. You have vaginal bleeding with or without clumps of blood (blood clots). MAKE SURE YOU: Understand these instructions. Will watch your condition. Will get help  right away if you are not doing well or get worse. Document Released: 07/04/2008 Document Revised: 09/30/2013 Document Reviewed: 04/24/2013 ExitCare Patient Information 2015 [employer]. This information is not intended to replace advice given to you by your health care provider. Make sure you discuss any questions you have with your health care provider.   

## 2023-05-27 NOTE — Discharge Summary (Signed)
Physician Discharge Summary  Patient ID: Carmen [name] MRN: [id number] DOB/AGE: 34/07/1991 34 y.o.  Admit date: 05/26/2023 Discharge date: 05/27/2023  Admission Diagnoses:  Discharge Diagnoses:  Principal Problem:   Incomplete abortion Active Problems:   Status post surgery   Discharged Condition: {condition:18240}  Hospital Course: ***  Consults: {consultation:18241}  Significant Diagnostic Studies: {diagnostics:18242}  Treatments: {Tx:18249}  Discharge Exam: Blood pressure 101/69, pulse 74, temperature 97.8 F (36.6 C), temperature source Oral, resp. rate 17, height 4' 11.02" (1.499 m), weight 60 kg, last menstrual period 04/12/2023, SpO2 99%. {physical WGNF:6213086}  Disposition: Discharge disposition: 01-Home or Self Care       Discharge Instructions     Discharge patient   Complete by: As directed    After morning lovenox   Discharge disposition: 01-Home or Self Care   Discharge patient date: 05/27/2023      Allergies as of 05/27/2023       Reactions   Morphine And Codeine Anaphylaxis, Swelling, Other (See Comments)   "swells shut" the patient's throat        Medication List     TAKE these medications    albuterol 108 (90 Base) MCG/ACT inhaler Commonly known as: VENTOLIN HFA Inhale 2 puffs into the lungs every 4 (four) hours as needed for wheezing or shortness of breath.   cyclobenzaprine 10 MG tablet Commonly known as: FLEXERIL Take 1 tablet (10 mg total) by mouth 2 (two) times daily as needed for muscle spasms (headache).   ibuprofen 200 MG tablet Commonly known as: Motrin IB Take 3 tablets (600 mg total) by mouth every 6 (six) hours as needed.   lidocaine 5 % Commonly known as: Lidoderm Place 1 patch onto the skin every 12 (twelve) hours for 5 days. Remove & Discard patch within 12 hours or as directed by MD   olopatadine 0.1 % ophthalmic solution Commonly known as: Patanol Place 1 drop into the left eye 2 (two) times daily.    ondansetron 4 MG disintegrating tablet Commonly known as: ZOFRAN-ODT Take 1 tablet (4 mg total) by mouth every 8 (eight) hours as needed for nausea or vomiting.        Follow-up Information     [facility]. Go to.   Specialty: Obstetrics and Gynecology Why: the office will call you with this appointment Contact information: [address] [place] [address] [phone]                Signed: [place] Bing 05/27/2023, 8:55 AM

## 2023-05-29 LAB — SURGICAL PATHOLOGY

## 2023-06-14 ENCOUNTER — Ambulatory Visit: Payer: Medicaid Other | Admitting: Obstetrics and Gynecology

## 2023-06-29 ENCOUNTER — Other Ambulatory Visit: Payer: Self-pay

## 2023-06-29 ENCOUNTER — Other Ambulatory Visit (HOSPITAL_COMMUNITY)
Admission: RE | Admit: 2023-06-29 | Discharge: 2023-06-29 | Disposition: A | Discharge status: Home / Self Care | Payer: Medicaid Other | Source: Ambulatory Visit | Attending: Obstetrics and Gynecology | Admitting: Obstetrics and Gynecology

## 2023-06-29 ENCOUNTER — Ambulatory Visit: Payer: Medicaid Other | Admitting: Obstetrics and Gynecology

## 2023-06-29 ENCOUNTER — Encounter: Payer: Self-pay | Admitting: Obstetrics and Gynecology

## 2023-06-29 VITALS — BP 117/86 | HR 92 | Wt 138.7 lb

## 2023-06-29 DIAGNOSIS — Z3009 Encounter for other general counseling and advice on contraception: Secondary | ICD-10-CM

## 2023-06-29 DIAGNOSIS — Z86711 Personal history of pulmonary embolism: Secondary | ICD-10-CM

## 2023-06-29 DIAGNOSIS — Z124 Encounter for screening for malignant neoplasm of cervix: Secondary | ICD-10-CM | POA: Insufficient documentation

## 2023-06-29 DIAGNOSIS — Z09 Encounter for follow-up examination after completed treatment for conditions other than malignant neoplasm: Secondary | ICD-10-CM | POA: Insufficient documentation

## 2023-06-29 DIAGNOSIS — Z8759 Personal history of other complications of pregnancy, childbirth and the puerperium: Secondary | ICD-10-CM

## 2023-06-29 DIAGNOSIS — Z30430 Encounter for insertion of intrauterine contraceptive device: Secondary | ICD-10-CM

## 2023-06-29 DIAGNOSIS — Z975 Presence of (intrauterine) contraceptive device: Secondary | ICD-10-CM

## 2023-06-29 HISTORY — PX: IUD INSERTION: OBO1003

## 2023-06-29 MED ORDER — IBUPROFEN 800 MG PO TABS
800.0000 mg | ORAL_TABLET | Freq: Once | ORAL | Status: AC
Start: 2023-06-29 — End: 2023-06-29
  Administered 2023-06-29: 800 mg via ORAL

## 2023-06-29 MED ORDER — PARAGARD INTRAUTERINE COPPER IU IUD
1.0000 | INTRAUTERINE_SYSTEM | Freq: Once | INTRAUTERINE | Status: AC
Start: 2023-06-29 — End: 2023-06-29
  Administered 2023-06-29: 1 via INTRAUTERINE

## 2023-06-29 NOTE — Procedures (Signed)
Intrauterine Device (IUD) Insertion Procedure Note  he patient understands the risks of IUD placement, which include but are not limited to: bleeding, infection, uterine perforation, risk of expulsion, risk of failure < 1%, increased risk of ectopic pregnancy in the event of failure.   Prior to the procedure being performed, the patient (or guardian) was asked to state their full name, date of birth, and the type of procedure being performed. A bimanual exam showed the uterus to be midposition.  Next, the cervix and vagina were cleaned with an antiseptic solution, after a pap smear was performed, and the cervix was grasped with a tenaculum.  The uterus was sounded to 8 cm with the pipelle. The ParaGard was placed without difficulty in the usual fashion.  The strings were cut to 3-4 cm.  The tenaculum was removed and cervix was found to be hemostatic.    No complications, patient tolerated the procedure well.  [name] MD Attending Center for [facility])

## 2023-06-29 NOTE — Progress Notes (Unsigned)
Obstetrics and Gynecology Visit Return Patient Evaluation  Appointment Date: 06/29/2023  Primary Care Provider: Patient, No Pcp Per  OBGYN Clinic: [facility]   Chief Complaint: routine post op visit  History of Present Illness:  Carmen [name] is a 32 y.o. [id number] s/p 8/17 suction d&c for incomplete early AB. PMHx significant for h/o PE during a pregnancy  Interval History: Since that time, she states that she's doing well and bleeding stopped about a week post op; she is having some cramping like it may start.   Patient would like to do something for birth control; she has never been on anything in the past. Nothing in vagina since surgery  Review of Systems: as noted in the History of Present Illness.  Patient Active Problem List   Diagnosis Date Noted   History of pulmonary embolus during previous pregnancy in 2019 08/27/2018   Medications:  Dreama Saa had no medications administered during this visit. Current Outpatient Medications  Medication Sig Dispense Refill   albuterol (VENTOLIN HFA) 108 (90 Base) MCG/ACT inhaler Inhale 2 puffs into the lungs every 4 (four) hours as needed for wheezing or shortness of breath. 18 g 0   cyclobenzaprine (FLEXERIL) 10 MG tablet Take 1 tablet (10 mg total) by mouth 2 (two) times daily as needed for muscle spasms (headache). 30 tablet 0   ibuprofen (MOTRIN IB) 200 MG tablet Take 3 tablets (600 mg total) by mouth every 6 (six) hours as needed.     ondansetron (ZOFRAN-ODT) 4 MG disintegrating tablet Take 1 tablet (4 mg total) by mouth every 8 (eight) hours as needed for nausea or vomiting. (Patient not taking: Reported on 06/29/2023) 20 tablet 0   No current facility-administered medications for this visit.    Allergies: is allergic to morphine and codeine.  Physical Exam:  BP 117/86   Pulse 92   Wt 138 lb 11.2 oz (62.9 kg)   LMP 04/12/2023   Breastfeeding Unknown   BMI 28.00 kg/m  Body mass index  is 28 kg/m. General appearance: Well nourished, well developed female in no acute distress.  Abdomen: diffusely non tender to palpation, non distended, and no masses, hernias Neuro/Psych:  Normal mood and affect.    Pelvic exam:  Cervical exam performed in the presence of a chaperone EGBUS: normal Vaginal vault: normal Cervix:  normal Bimanual: negative  See procedure note for paragard IUD insertion   Assessment: patient doing well  Plan:  1. History of pulmonary embolus during previous pregnancy in 2019 Options d/w her and she would like to do paragard IUD  2. Postop check - Cytology - PAP( [place])  3. Cervical cancer screening - Cytology - PAP( [place])  4. Encounter for other general counseling or advice on contraception   RTC: 6-8wks for IUD check up  [name] MD Attending Center for [facility])

## 2023-07-02 DIAGNOSIS — Z975 Presence of (intrauterine) contraceptive device: Secondary | ICD-10-CM | POA: Insufficient documentation

## 2023-07-03 LAB — CYTOLOGY - PAP
Chlamydia: NEGATIVE
Comment: NEGATIVE
Comment: NEGATIVE
Comment: NEGATIVE
Comment: NORMAL
Diagnosis: NEGATIVE
High risk HPV: NEGATIVE
Neisseria Gonorrhea: NEGATIVE
Trichomonas: NEGATIVE

## 2023-11-12 ENCOUNTER — Encounter (HOSPITAL_COMMUNITY): Payer: Self-pay

## 2023-11-12 ENCOUNTER — Ambulatory Visit (HOSPITAL_COMMUNITY)
Admission: EM | Admit: 2023-11-12 | Discharge: 2023-11-12 | Disposition: A | Discharge status: Home / Self Care | Payer: Medicaid Other | Attending: Emergency Medicine | Admitting: Emergency Medicine

## 2023-11-12 DIAGNOSIS — L0291 Cutaneous abscess, unspecified: Secondary | ICD-10-CM

## 2023-11-12 MED ORDER — DOXYCYCLINE HYCLATE 100 MG PO CAPS: 100.0000 mg | ORAL_CAPSULE | Freq: Two times a day (BID) | ORAL | 0 refills | Status: AC

## 2023-11-12 NOTE — ED Provider Notes (Addendum)
[facility]    CSN: [id number] Arrival date & time: 11/12/23  8295      History   Chief Complaint Chief Complaint  Patient presents with   Abscess    HPI Carmen [name] is a 33 y.o. female.  Two week history of possible abscess in the right groin 2 days ago seemed to worsen Rating 8/10 pain today Not draining She does have history of abscess in this area, but not same spot  Past Medical History:  Diagnosis Date   Alpha thalassemia silent carrier 08/09/2020   Referred to genetics     Anemia    Asthma    Headache    Kidney stones    Marijuana abuse 08/12/2020   Panic attacks    Pulmonary embolism, antepartum 02/08/2018   Indications: PE diagnosed 5/2@ [facility].  Left side, lower lobe;   Admitted 5/2-5/6 - heparin drip>lovenox 60BID.  ED evaluation 5/8 2/2 persistent SOB - negative DVT, nml ECG Anticoagulation goal is therapeutic  Therapeutic dose is Lovenox 1mg /kg SQ Q12--currently on 60 mg BID  Monitoring is indicated for patients on therapeutic anticoagulation  Will check Anti-Xa level 4-6 hours after 3rd dose Q trime   UTI (urinary tract infection)     Patient Active Problem List   Diagnosis Date Noted   IUD (intrauterine device) in place 07/02/2023   History of pulmonary embolus during previous pregnancy in 2019 08/27/2018    Past Surgical History:  Procedure Laterality Date   DILATION AND CURETTAGE OF UTERUS N/A 05/26/2023   Procedure: SUCTION DILATATION AND CURETTAGE;  Surgeon: Brentwood Bing, MD;  Location: [facility] OR;  Service: Gynecology;  Laterality: N/A;   IUD INSERTION  06/29/2023   NO PAST SURGERIES      OB History     Gravida  3   Para  2   Term  2   Preterm  0   AB  0   Living  2      SAB  0   IAB  0   Ectopic  0   Multiple  0   Live Births  2            Home Medications    Prior to Admission medications   Medication Sig Start Date End Date Taking? Authorizing Provider  doxycycline (VIBRAMYCIN) 100 MG capsule Take 1  capsule (100 mg total) by mouth 2 (two) times daily for 7 days. 11/12/23 11/19/23 Yes Adaleigh Warf, Lurena Joiner, PA-C  ibuprofen (MOTRIN IB) 200 MG tablet Take 3 tablets (600 mg total) by mouth every 6 (six) hours as needed. 05/27/23    Bing, MD    Family History Family History  Problem Relation Age of Onset   Pulmonary embolism Mother    Healthy Father    Ulcers Sister     Social History Social History   Tobacco Use   Smoking status: Former    Types: Cigarettes   Smokeless tobacco: Never   Tobacco comments:    Stopped 04/2023  Vaping Use   Vaping status: Never Used  Substance Use Topics   Alcohol use: Not Currently   Drug use: Not Currently    Types: Marijuana    Comment: end of July/early Aug, "occ"     Allergies   Morphine and codeine   Review of Systems Review of Systems Per HPI  Physical Exam Triage Vital Signs ED Triage Vitals [11/12/23 0912]  Encounter Vitals Group     BP      Systolic BP Percentile  Diastolic BP Percentile      Pulse      Resp      Temp      Temp src      SpO2      Weight      Height      Head Circumference      Peak Flow      Pain Score 8     Pain Loc      Pain Education      Exclude from Growth Chart    No data found.  Updated Vital Signs BP 103/76 (BP Location: Right Arm)   Pulse 83   Temp 98.6 F (37 C) (Oral)   Resp 14   LMP 10/21/2023   SpO2 98%    Physical Exam Vitals and nursing note reviewed. Exam conducted with a chaperone present Hulan Saas CMA).  Constitutional:      General: She is not in acute distress.    Appearance: Normal appearance.  Cardiovascular:     Rate and Rhythm: Normal rate and regular rhythm.     Heart sounds: Normal heart sounds.  Pulmonary:     Effort: Pulmonary effort is normal.     Breath sounds: Normal breath sounds.  Skin:    Findings: Abscess present.          Comments: Indurated area of right groin, tender and erythematous. No drainage. No inguinal adenopathy palpated   Neurological:     Mental Status: She is alert and oriented to person, place, and time.     UC Treatments / Results  Labs (all labs ordered are listed, but only abnormal results are displayed) Labs Reviewed - No data to display  EKG   Radiology No results found.  Procedures Procedures (including critical care time)  Medications Ordered in UC Medications - No data to display  Initial Impression / Assessment and Plan / UC Course  I have reviewed the triage vital signs and the nursing notes.  Pertinent labs & imaging results that were available during my care of the patient were reviewed by me and considered in my medical decision making (see chart for details).  Abscess Doxy BID x 7 days Discussed other symptomatic care, return precautions Patient agreeable to plan, no questions  Final Clinical Impressions(s) / UC Diagnoses   Final diagnoses:  Abscess     Discharge Instructions      Please take medication as prescribed. Take with food to avoid upset stomach. Finish full course! Return if needed     ED Prescriptions     Medication Sig Dispense Auth. Provider   doxycycline (VIBRAMYCIN) 100 MG capsule Take 1 capsule (100 mg total) by mouth 2 (two) times daily for 7 days. 14 capsule Ryliegh Mcduffey, Lurena Joiner, PA-C      PDMP not reviewed this encounter.   Shebra Muldrow, Ray Church 11/12/23 1610    Deolinda Frid, Lurena Joiner, PA-C 11/12/23 [phone]

## 2023-11-12 NOTE — ED Triage Notes (Signed)
Patient reports that she has right groin abscess x 2 weeks.

## 2023-11-12 NOTE — Discharge Instructions (Signed)
Please take medication as prescribed. Take with food to avoid upset stomach. Finish full course! Return if needed

## 2024-01-16 DIAGNOSIS — A6000 Herpesviral infection of urogenital system, unspecified: Secondary | ICD-10-CM | POA: Diagnosis not present

## 2024-03-05 DIAGNOSIS — K0889 Other specified disorders of teeth and supporting structures: Secondary | ICD-10-CM | POA: Diagnosis not present

## 2024-03-05 DIAGNOSIS — Z0120 Encounter for dental examination and cleaning without abnormal findings: Secondary | ICD-10-CM | POA: Diagnosis not present

## 2024-05-26 NOTE — Progress Notes (Signed)
 ------------------------------------------------------------------------------- Summary: EXT #15 -------------------------------------------------------------------------------  [facility] 05/26/24 No primary care provider on file. Treatment Providers Alan Daring Kotis, DMD  Dental procedures in this visit  . D0140 - LIMITED ORAL EVALUATION - PROBLEM FOCUSED  . I9779 - SINGLE X-RAY  . D70 - INTRAORAL PHOTO    HEALTH HISTORY ? Vitals:  BP Readings from Last 1 Encounters:  05/26/24 134/75    Pulse:  71 Medical history was reviewed and updated. No contraindication to care. Medical History[1] Surgical History[2] Social History   Tobacco Use  . Smoking status: Some Days    Types: Cigars, E-Cigarettes  . Smokeless tobacco: Never  Substance Use Topics  . Alcohol use: Not Currently   Family History[3] Medications Ordered Prior to Encounter[4] Medical Risk Assessment ASA GRADE: ASA 1 - Normal health patient  Subjective: Patient presents today for extraction #15 pain comes and goes  Patient reports  no change to condition since last visit.   Objective:  Radiographs taken:  Pano taken   All Radiographs Entry:    Assessment: Pt presents to clinic for surgical singular of tooth number(s): 15 due to non-restorable (patient declined RCT)  Discussed with patient risks and precautions of procedure. Necessary consent were obtained from patient.  RISKS & LIABILITY GIVEN: Discussed with patient potential risk of procedural complication which may include NA  Paused to confirm correct patient, correct teeth for treatment, and confirmed diagnosis for treatment.  Consent obtained? Yes  Topical (Benzocaine  20%) placed.  ANESTHETIC 1:Lidocaine  2% epi: 1:100K x Carpules: 1 carpule;  ANESTHETIC 2:Septocaine (Articaine hydrochloride 4% w/ epi 1:100K) x Carpules: 2 carpules;  Profound anesthesia achieved. Use of periosteal, luxators and elevators to luxate tooth.  Use of forcep to extract tooth. Curetted and Irrigation w/ sterile water and/or 0.12% chlohexidine gluconate of extraction site. Alveolar bone compression applied at site of extraction. Placed moistened 2x2 guaze at extraction site with instructions to remove in 20-30 minutes unless excessive bleeding noted.  Surgical foam/plug placed: NA Surgical procedure: surgical handpiece used to section tooth mesial bone removed to elevate buccal roots tips out of socket with root caries elevator Bonegraft Placed: No  Throat guard placed Suture type: No Suture Placed No brisk bleeding observed. No observation of buccal or lingual plate fracture. Patient dismissed in stable condition. PRESCRIPTION: Amoxicillin  (see Rx script for additional details) 500MG   Patients bracket came off tooth #14 - patient aware   Additional notes: None  Plan: Discussed with patient the importance of preserving the clot in socket by avoiding for a minimum of 72 hours: spitting, swishing, use of mouthwash, use of peroxide, use of straws, and smoking.  Reviewed post op instructions and answered all of patient's questions. Prescribed the following medications: PRESCRIPTION: Amoxicillin  (see Rx script for additional details) Advised patient, if after being dismissed patient notes excessive bleeding to contact our office immediately or, if unable to reach our office, report to urgent care/emergency room as needed. Patient expressed understanding of all that was discussed.  NV: Comp Exam/Probing    Treatment Providers Dental Assistant: Kelsey Alliance  Alan Daring Fairly, DMD [facility] [address] [place] [address] [phone]         [1] Past Medical History: Diagnosis Date  . Anemia   . Asthma   . High risk pregnancy, antepartum   . History of miscarriage   [2] No past surgical history on file. [3] No family history on file. [4] No  current outpatient medications on file prior to visit.   No current facility-administered medications on file prior to visit.

## 2024-05-28 ENCOUNTER — Ambulatory Visit: Payer: Self-pay

## 2024-05-28 NOTE — Telephone Encounter (Signed)
  Reason for Disposition  [1] SEVERE pain (e.g., excruciating, unable to do any normal activities) AND [2] not improved 2 hours after pain medicine  Face is very swollen  Answer Assessment - Initial Assessment Questions Patient had upper left molar extraction on 06/26/24 by [facility]. Patient now reports that they are not credentialed with [facility] for amoxicillin  that was prescribed.   Patient does not have a [place] PCP. Referred to UC or ED. Patient was provided with details for closest UC location to her home.   1. SYMPTOM: What's the main symptom you're concerned about? (e.g., bleeding, pain, fever)     Swelling and pain since extraction  2. ONSET:      06/26/24  3. DATE of TOOTH EXTRACTION: When was the surgery performed?      06/26/24  4. BLEEDING: Is there any bleeding? If Yes, ask: How much?      Denies  5. PAIN: Is there any pain? If Yes, ask: How bad is it?  (Scale 1-10; or mild, moderate, severe)     Severe  6. FEVER: Do you have a fever? If Yes, ask: What is your temperature, how was it measured, and when did it start?     Denies  Protocols used: Tooth Extraction-A-AH Copied from CRM [id number]. Topic: Clinical - Red Word Triage >> May 28, 2024  3:51 PM Donna BRAVO wrote: Red Word that prompted transfer to Nurse Triage: Kia with [facility] calling with patient, patient had tooth pulled on Monday, patient is in extreme pain, Face is swollen hard to open mouth

## 2024-06-20 ENCOUNTER — Inpatient Hospital Stay (HOSPITAL_COMMUNITY)
Admission: AD | Admit: 2024-06-20 | Discharge: 2024-06-20 | Disposition: A | Discharge status: Home / Self Care | Attending: Obstetrics and Gynecology | Admitting: Obstetrics and Gynecology

## 2024-06-20 ENCOUNTER — Encounter (HOSPITAL_COMMUNITY): Payer: Self-pay | Admitting: Obstetrics and Gynecology

## 2024-06-20 ENCOUNTER — Other Ambulatory Visit: Payer: Self-pay

## 2024-06-20 DIAGNOSIS — N921 Excessive and frequent menstruation with irregular cycle: Secondary | ICD-10-CM | POA: Diagnosis not present

## 2024-06-20 DIAGNOSIS — D62 Acute posthemorrhagic anemia: Secondary | ICD-10-CM

## 2024-06-20 DIAGNOSIS — Z3202 Encounter for pregnancy test, result negative: Secondary | ICD-10-CM | POA: Insufficient documentation

## 2024-06-20 DIAGNOSIS — N939 Abnormal uterine and vaginal bleeding, unspecified: Secondary | ICD-10-CM

## 2024-06-20 DIAGNOSIS — Z975 Presence of (intrauterine) contraceptive device: Secondary | ICD-10-CM | POA: Diagnosis not present

## 2024-06-20 LAB — CBC
HCT: 33.8 % — ABNORMAL LOW (ref 36.0–46.0)
Hemoglobin: 10.6 g/dL — ABNORMAL LOW (ref 12.0–15.0)
MCH: 28 pg (ref 26.0–34.0)
MCHC: 31.4 g/dL (ref 30.0–36.0)
MCV: 89.4 fL (ref 80.0–100.0)
Platelets: 211 K/uL (ref 150–400)
RBC: 3.78 MIL/uL — ABNORMAL LOW (ref 3.87–5.11)
RDW: 13.7 % (ref 11.5–15.5)
WBC: 5.6 K/uL (ref 4.0–10.5)
nRBC: 0 % (ref 0.0–0.2)

## 2024-06-20 LAB — POCT PREGNANCY, URINE: Preg Test, Ur: NEGATIVE

## 2024-06-20 MED ORDER — MEDROXYPROGESTERONE ACETATE 5 MG PO TABS: 5.0000 mg | ORAL_TABLET | Freq: Every day | ORAL | 1 refills | Status: DC

## 2024-06-20 MED ORDER — NORETHINDRONE ACETATE 5 MG PO TABS
5.0000 mg | ORAL_TABLET | Freq: Every day | ORAL | 1 refills | Status: AC
Start: 2024-06-20 — End: 2024-06-30

## 2024-06-20 NOTE — MAU Note (Signed)
 Carmen [name] is a 33 y.o. at Unknown here in MAU reporting: she began having heavy VB yesterday, passing plum size clots.  States she has a IUD and hasn't taken a HPT.  States changing tampons (Super) every 30 minutes to 1 hour.  States last time she bled like this was with a miscarriage last year.  LMP: 05/18/2024 Onset of complaint: yesterday Pain score: 10 right lower back pain above buttocks Vitals:   06/20/24 0947  BP: 116/74  Pulse: 87  Resp: 18  Temp: 98.1 F (36.7 C)  SpO2: 100%     FHT: NA  Lab orders placed from triage: UPT

## 2024-06-20 NOTE — MAU Provider Note (Signed)
 Chief Complaint: Vaginal Bleeding   Event Date/Time   First Provider Initiated Contact with Patient 06/20/24 1030      SUBJECTIVE HPI: Carmen [name] is a 33 y.o.  [id number] with hx significant for PE in previous pregnancy who presents to maternity admissions reporting onset of cramping lower abdominal pain and back pain yesterday with light bleeding, then heavy bleeding with large clots, soaking a super plus tampon every hour today.  She has a Paragard  IUD, placed 06/29/2023 following D&C after SAB.  Pt reports the last time she had bleeding like this was with her miscarriage. She has not taken a pregnancy test.  She denies dizziness, SOB, or HA.   HPI  Past Medical History:  Diagnosis Date   Alpha thalassemia silent carrier 08/09/2020   Referred to genetics     Anemia    Asthma    Headache    Kidney stones    Marijuana abuse 08/12/2020   Panic attacks    Pulmonary embolism, antepartum 02/08/2018   Indications: PE diagnosed 5/2@ [facility].  Left side, lower lobe;   Admitted 5/2-5/6 - heparin drip>lovenox  60BID.  ED evaluation 5/8 2/2 persistent SOB - negative DVT, nml ECG Anticoagulation goal is therapeutic  Therapeutic dose is Lovenox  1mg /kg SQ Q12--currently on 60 mg BID  Monitoring is indicated for patients on therapeutic anticoagulation  Will check Anti-Xa level 4-6 hours after 3rd dose Q trime   UTI (urinary tract infection)    Past Surgical History:  Procedure Laterality Date   DILATION AND CURETTAGE OF UTERUS N/A 05/26/2023   Procedure: SUCTION DILATATION AND CURETTAGE;  Surgeon: Izell Harari, MD;  Location: [facility] OR;  Service: Gynecology;  Laterality: N/A;   IUD INSERTION  06/29/2023   NO PAST SURGERIES     Social History   Socioeconomic History   Marital status: Single    Spouse name: Not on file   Number of children: Not on file   Years of education: Not on file   Highest education level: Not on file  Occupational History   Not on file  Tobacco Use   Smoking status: Former     Types: Cigarettes   Smokeless tobacco: Never   Tobacco comments:    Stopped 04/2023  Vaping Use   Vaping status: Never Used  Substance and Sexual Activity   Alcohol use: Not Currently   Drug use: Not Currently    Types: Marijuana    Comment: end of July/early Aug, occ   Sexual activity: Not Currently  Other Topics Concern   Not on file  Social History Narrative   Not on file   Social Drivers of Health   Financial Resource Strain: Medium Risk (08/19/2018)   Overall Financial Resource Strain (CARDIA)    Difficulty of Paying Living Expenses: Somewhat hard  Food Insecurity: No Food Insecurity (05/27/2023)   Hunger Vital Sign    Worried About Running Out of Food in the Last Year: Never true    Ran Out of Food in the Last Year: Never true  Transportation Needs: No Transportation Needs (05/27/2023)   PRAPARE - Administrator, Civil Service (Medical): No    Lack of Transportation (Non-Medical): No  Physical Activity: Not on file  Stress: No Stress Concern Present (08/19/2018)   [employer] of Occupational Health - Occupational Stress Questionnaire    Feeling of Stress : Only a little  Social Connections: Not on file  Intimate Partner Violence: Not At Risk (05/27/2023)   Humiliation, Afraid, Rape, and Kick  questionnaire    Fear of Current or Ex-Partner: No    Emotionally Abused: No    Physically Abused: No    Sexually Abused: No   No current facility-administered medications on file prior to encounter.   Current Outpatient Medications on File Prior to Encounter  Medication Sig Dispense Refill   ibuprofen  (MOTRIN  IB) 200 MG tablet Take 3 tablets (600 mg total) by mouth every 6 (six) hours as needed.     Allergies  Allergen Reactions   Morphine And Codeine Anaphylaxis, Swelling and Other (See Comments)    swells shut the patient's throat    ROS:  Review of Systems  Constitutional:  Negative for chills, fatigue and fever.  Respiratory:  Negative for  shortness of breath.   Cardiovascular:  Negative for chest pain.  Gastrointestinal:  Negative for nausea and vomiting.  Genitourinary:  Positive for pelvic pain and vaginal bleeding. Negative for difficulty urinating, dysuria, flank pain, vaginal discharge and vaginal pain.  Musculoskeletal:  Positive for back pain.  Neurological:  Negative for dizziness and headaches.  Psychiatric/Behavioral: Negative.       I have reviewed patient's Past Medical Hx, Surgical Hx, Family Hx, Social Hx, medications and allergies.   Physical Exam  Patient Vitals for the past 24 hrs:  BP Temp Temp src Pulse Resp SpO2 Height Weight  06/20/24 1238 105/70 -- -- 78 -- -- -- --  06/20/24 1017 109/78 -- -- 81 -- -- -- --  06/20/24 0947 116/74 98.1 F (36.7 C) Oral 87 18 100 % -- --  06/20/24 0941 -- -- -- -- -- -- 4' 11 (1.499 m) 66 kg   Constitutional: Well-developed, well-nourished female in no acute distress.  Cardiovascular: normal rate Respiratory: normal effort GI: Abd soft, non-tender. Pos BS x 4 MS: Extremities nontender, no edema, normal ROM Neurologic: Alert and oriented x 4.  GU: Neg CVAT.  PELVIC EXAM: Cervix pink, visually closed, without lesion, IUD string visible, ~ 3 cm in length from cervical os, moderate amount of dark red bleeding without clots, 2 fox swabs used to visualize cervix, vaginal walls and external genitalia normal   LAB RESULTS Results for orders placed or performed during the hospital encounter of 06/20/24 (from the past 24 hours)  Pregnancy, urine POC     Status: None   Collection Time: 06/20/24 10:23 AM  Result Value Ref Range   Preg Test, Ur NEGATIVE NEGATIVE  CBC     Status: Abnormal   Collection Time: 06/20/24 11:08 AM  Result Value Ref Range   WBC 5.6 4.0 - 10.5 K/uL   RBC 3.78 (L) 3.87 - 5.11 MIL/uL   Hemoglobin 10.6 (L) 12.0 - 15.0 g/dL   HCT 66.1 (L) 63.9 - 53.9 %   MCV 89.4 80.0 - 100.0 fL   MCH 28.0 26.0 - 34.0 pg   MCHC 31.4 30.0 - 36.0 g/dL   RDW  86.2 88.4 - 84.4 %   Platelets 211 150 - 400 K/uL   nRBC 0.0 0.0 - 0.2 %       IMAGING No results found.  MAU Management/MDM: Orders Placed This Encounter  Procedures   CBC   Pregnancy, urine POC   Discharge patient Discharge disposition: 01-Home or Self Care; Discharge patient date: 06/20/2024    Meds ordered this encounter  Medications   DISCONTD: medroxyPROGESTERone  (PROVERA ) 5 MG tablet    Sig: Take 1 tablet (5 mg total) by mouth daily.    Dispense:  10 tablet    Refill:  1    Supervising Provider:   CONSTANT, PEGGY [[id number]]   norethindrone  (AYGESTIN ) 5 MG tablet    Sig: Take 1 tablet (5 mg total) by mouth daily for 10 days.    Dispense:  10 tablet    Refill:  1    Supervising Provider:   CONSTANT, PEGGY [[id number]]    Pregnancy test negative, reassurance provided to patient that this is likely not a miscarriage, and is bleeding related to copper  IUD.  Consult Dr Alger with assessment and findings.   Hgb 10.6, no recent labs to compare. Recommend oral iron  supplement every other day or women's multivitamin daily and iron  rich foods in diet.  Rx for Aygestin  5 mg daily x 10 days.  F/U at [facility] for gyn visit.  Return to MAU or ED for emergencies.   ASSESSMENT 1. Breakthrough bleeding associated with intrauterine device (IUD)   2. Abnormal uterine bleeding (AUB)   3. Anemia associated with acute blood loss     PLAN Discharge home Allergies as of 06/20/2024       Reactions   Morphine And Codeine Anaphylaxis, Swelling, Other (See Comments)   swells shut the patient's throat        Medication List     TAKE these medications    ibuprofen  200 MG tablet Commonly known as: Motrin  IB Take 3 tablets (600 mg total) by mouth every 6 (six) hours as needed.   norethindrone  5 MG tablet Commonly known as: Aygestin  Take 1 tablet (5 mg total) by mouth daily for 10 days.        Follow-up Information     [facility]  Follow up.   Specialty: Obstetrics and Gynecology Why: Make a follow up Gyn appointment as soon as possible. Contact information: [address] [place]  [address] [phone]        [facility] Follow up.   Specialty: Obstetrics and Gynecology Why: As needed for emergencies Contact information: [address] [place]   [address] [phone]                Olam Boards Certified Nurse-Midwife 06/20/2024  2:37 PM

## 2024-06-23 LAB — GC/CHLAMYDIA PROBE AMP (~~LOC~~) NOT AT ARMC
Chlamydia: NEGATIVE
Comment: NEGATIVE
Comment: NORMAL
Neisseria Gonorrhea: NEGATIVE

## 2024-08-04 ENCOUNTER — Other Ambulatory Visit: Payer: Self-pay

## 2024-08-04 ENCOUNTER — Ambulatory Visit (HOSPITAL_COMMUNITY)
Admission: EM | Admit: 2024-08-04 | Discharge: 2024-08-04 | Disposition: A | Discharge status: Home / Self Care | Attending: Family Medicine | Admitting: Family Medicine

## 2024-08-04 ENCOUNTER — Encounter (HOSPITAL_COMMUNITY): Payer: Self-pay | Admitting: Emergency Medicine

## 2024-08-04 DIAGNOSIS — L232 Allergic contact dermatitis due to cosmetics: Secondary | ICD-10-CM | POA: Diagnosis not present

## 2024-08-04 MED ORDER — DEXAMETHASONE SOD PHOSPHATE PF 10 MG/ML IJ SOLN
10.0000 mg | Freq: Once | INTRAMUSCULAR | Status: AC
  Administered 2024-08-04: 10 mg via INTRAMUSCULAR

## 2024-08-04 MED ORDER — CETIRIZINE HCL 10 MG PO TABS: 10.0000 mg | ORAL_TABLET | Freq: Every day | ORAL | 0 refills | Status: AC | PRN

## 2024-08-04 MED ORDER — PREDNISONE 20 MG PO TABS: 40.0000 mg | ORAL_TABLET | Freq: Every day | ORAL | 0 refills | Status: AC

## 2024-08-04 NOTE — ED Provider Notes (Signed)
 [facility]    CSN: [id number] Arrival date & time: 08/04/24  9160      History   Chief Complaint Chief Complaint  Patient presents with   Urticaria    HPI Carmen [name] is a 33 y.o. female.    Urticaria  Here for itching and rash and burning on her face.  About 2 weeks ago on October 14 she went to the hair salon and had some products applied to her hair.  Since then she has had rash and itching and burning around her eyebrows and then some on her face and around her eyes and now on her neck.  No trouble breathing and no sensation that her throat is closing up.  Her lips have not been swelling  She is allergic to morphine and codeine which cause anaphylaxis  Last menstrual cycle was October 5  Past Medical History:  Diagnosis Date   Alpha thalassemia silent carrier 08/09/2020   Referred to genetics     Anemia    Asthma    Headache    Kidney stones    Marijuana abuse 08/12/2020   Panic attacks    Pulmonary embolism, antepartum 02/08/2018   Indications: PE diagnosed 5/2@ [facility].  Left side, lower lobe;   Admitted 5/2-5/6 - heparin drip>lovenox  60BID.  ED evaluation 5/8 2/2 persistent SOB - negative DVT, nml ECG Anticoagulation goal is therapeutic  Therapeutic dose is Lovenox  1mg /kg SQ Q12--currently on 60 mg BID  Monitoring is indicated for patients on therapeutic anticoagulation  Will check Anti-Xa level 4-6 hours after 3rd dose Q trime   UTI (urinary tract infection)     Patient Active Problem List   Diagnosis Date Noted   IUD (intrauterine device) in place 07/02/2023   History of pulmonary embolus during previous pregnancy in 2019 08/27/2018    Past Surgical History:  Procedure Laterality Date   DILATION AND CURETTAGE OF UTERUS N/A 05/26/2023   Procedure: SUCTION DILATATION AND CURETTAGE;  Surgeon: Izell Harari, MD;  Location: [facility] OR;  Service: Gynecology;  Laterality: N/A;   IUD INSERTION  06/29/2023   NO PAST SURGERIES      OB History      Gravida  3   Para  2   Term  2   Preterm  0   AB  1   Living  2      SAB  1   IAB  0   Ectopic  0   Multiple  0   Live Births  2            Home Medications    Prior to Admission medications   Medication Sig Start Date End Date Taking? Authorizing Provider  cetirizine (ZYRTEC ALLERGY) 10 MG tablet Take 1 tablet (10 mg total) by mouth daily as needed for allergies (and itching). 08/04/24  Yes Vonna Sharlet POUR, MD  predniSONE  (DELTASONE ) 20 MG tablet Take 2 tablets (40 mg total) by mouth daily with breakfast for 5 days. 08/04/24 08/09/24 Yes BanisterSharlet POUR, MD  norethindrone  (AYGESTIN ) 5 MG tablet Take 1 tablet (5 mg total) by mouth daily for 10 days. 06/20/24 06/30/24  Leftwich-Kirby, Olam LABOR, CNM    Family History Family History  Problem Relation Age of Onset   Pulmonary embolism Mother    Healthy Father    Ulcers Sister     Social History Social History   Tobacco Use   Smoking status: Some Days    Types: Cigars   Smokeless tobacco: Never  Tobacco comments:    Stopped 04/2023  Vaping Use   Vaping status: Never Used  Substance Use Topics   Alcohol use: Not Currently   Drug use: Not Currently    Types: Marijuana    Comment: end of July/early Aug, occ     Allergies   Morphine and codeine   Review of Systems Review of Systems   Physical Exam Triage Vital Signs ED Triage Vitals  Encounter Vitals Group     BP 08/04/24 0913 113/73     Girls Systolic BP Percentile --      Girls Diastolic BP Percentile --      Boys Systolic BP Percentile --      Boys Diastolic BP Percentile --      Pulse Rate 08/04/24 0913 79     Resp 08/04/24 0913 18     Temp 08/04/24 0913 98.8 F (37.1 C)     Temp Source 08/04/24 0913 Oral     SpO2 08/04/24 0913 99 %     Weight --      Height --      Head Circumference --      Peak Flow --      Pain Score 08/04/24 0911 10     Pain Loc --      Pain Education --      Exclude from Growth Chart --    No data  found.  Updated Vital Signs BP 113/73 (BP Location: Right Arm)   Pulse 79   Temp 98.8 F (37.1 C) (Oral)   Resp 18   LMP 07/13/2024 (Approximate)   SpO2 99%   Visual Acuity Right Eye Distance:   Left Eye Distance:   Bilateral Distance:    Right Eye Near:   Left Eye Near:    Bilateral Near:     Physical Exam Vitals reviewed.  Constitutional:      General: She is not in acute distress.    Appearance: She is not ill-appearing, toxic-appearing or diaphoretic.  HENT:     Mouth/Throat:     Mouth: Mucous membranes are moist.     Pharynx: No oropharyngeal exudate or posterior oropharyngeal erythema.  Eyes:     Extraocular Movements: Extraocular movements intact.     Conjunctiva/sclera: Conjunctivae normal.     Pupils: Pupils are equal, round, and reactive to light.  Cardiovascular:     Rate and Rhythm: Normal rate and regular rhythm.  Pulmonary:     Effort: Pulmonary effort is normal. No respiratory distress.     Breath sounds: Normal breath sounds. No stridor. No wheezing, rhonchi or rales.  Musculoskeletal:     Cervical back: Neck supple.  Lymphadenopathy:     Cervical: No cervical adenopathy.  Skin:    Coloration: Skin is not jaundiced or pale.  Neurological:     General: No focal deficit present.     Mental Status: She is alert and oriented to person, place, and time.  Psychiatric:        Behavior: Behavior normal.      UC Treatments / Results  Labs (all labs ordered are listed, but only abnormal results are displayed) Labs Reviewed - No data to display  EKG   Radiology No results found.  Procedures Procedures (including critical care time)  Medications Ordered in UC Medications  dexamethasone  (DECADRON ) injection 10 mg (has no administration in time range)    Initial Impression / Assessment and Plan / UC Course  I have reviewed the triage vital signs and  the nursing notes.  Pertinent labs & imaging results that were available during my care of  the patient were reviewed by me and considered in my medical decision making (see chart for details).     Decadron  is given here as an injection and a 5-day burst of prednisone  are sent to the pharmacy along with some Zyrtec.  She is given COND information for allergist and she will follow-up with primary care Final Clinical Impressions(s) / UC Diagnoses   Final diagnoses:  Allergic contact dermatitis due to cosmetics     Discharge Instructions      You have been given a shot of dexamethasone  10 mg, steroid.  Take prednisone  20 mg--2 daily for 5 days  Zyrtec/cetirizine 10 mg tablet--take 1 daily as needed for allergy or itching.  Please follow-up with primary care     ED Prescriptions     Medication Sig Dispense Auth. Provider   predniSONE  (DELTASONE ) 20 MG tablet Take 2 tablets (40 mg total) by mouth daily with breakfast for 5 days. 10 tablet Rhilynn Preyer K, MD   cetirizine (ZYRTEC ALLERGY) 10 MG tablet Take 1 tablet (10 mg total) by mouth daily as needed for allergies (and itching). 30 tablet Fayette Gasner K, MD      PDMP not reviewed this encounter.   Vonna Sharlet POUR, MD 08/04/24 [phone]

## 2024-08-04 NOTE — Discharge Instructions (Addendum)
 You have been given a shot of dexamethasone  10 mg, steroid.  Take prednisone  20 mg--2 daily for 5 days  Zyrtec/cetirizine 10 mg tablet--take 1 daily as needed for allergy or itching.  Please follow-up with primary care

## 2024-08-04 NOTE — ED Notes (Signed)
 Provided ice pack to help with itching

## 2024-08-04 NOTE — ED Triage Notes (Signed)
 Initially had itchy hives around eye brows, then eyes, face and now neck.  She does her own eyebrows and does not use chemicals  Symptoms for 2 weeks.  Had a hair appt on October 3.  Patient removed the product for a week now.   Describes itching and burning at times.  Has changed pillow cases and white wash cloths ( rather than colored clothes).  Used benadryl  cream.
